# Patient Record
Sex: Male | Born: 1950
Health system: Southern US, Community
[De-identification: ages and names within clinical notes are randomized; demographics above are authoritative.]

## PROBLEM LIST (undated history)

## (undated) DIAGNOSIS — M25561 Pain in right knee: Secondary | ICD-10-CM

## (undated) DIAGNOSIS — Z7901 Long term (current) use of anticoagulants: Secondary | ICD-10-CM

## (undated) DIAGNOSIS — N200 Calculus of kidney: Secondary | ICD-10-CM

## (undated) DIAGNOSIS — L570 Actinic keratosis: Secondary | ICD-10-CM

## (undated) DIAGNOSIS — G4733 Obstructive sleep apnea (adult) (pediatric): Secondary | ICD-10-CM

## (undated) DIAGNOSIS — G4730 Sleep apnea, unspecified: Secondary | ICD-10-CM

## (undated) DIAGNOSIS — I10 Essential (primary) hypertension: Secondary | ICD-10-CM

## (undated) DIAGNOSIS — I319 Disease of pericardium, unspecified: Secondary | ICD-10-CM

## (undated) DIAGNOSIS — Z01810 Encounter for preprocedural cardiovascular examination: Secondary | ICD-10-CM

## (undated) DIAGNOSIS — I480 Paroxysmal atrial fibrillation: Secondary | ICD-10-CM

## (undated) DIAGNOSIS — I499 Cardiac arrhythmia, unspecified: Secondary | ICD-10-CM

## (undated) DIAGNOSIS — M1990 Unspecified osteoarthritis, unspecified site: Secondary | ICD-10-CM

## (undated) DIAGNOSIS — Z96651 Presence of right artificial knee joint: Secondary | ICD-10-CM

## (undated) DIAGNOSIS — I4892 Unspecified atrial flutter: Secondary | ICD-10-CM

## (undated) DIAGNOSIS — I639 Cerebral infarction, unspecified: Secondary | ICD-10-CM

## (undated) DIAGNOSIS — G8929 Other chronic pain: Secondary | ICD-10-CM

## (undated) DIAGNOSIS — Q211 Atrial septal defect: Secondary | ICD-10-CM

## (undated) DIAGNOSIS — IMO0001 Reserved for inherently not codable concepts without codable children: Secondary | ICD-10-CM

## (undated) HISTORY — DX: Encounter for preprocedural cardiovascular examination: Z01.810

## (undated) HISTORY — DX: Unspecified osteoarthritis, unspecified site: M19.90

## (undated) HISTORY — PX: CYST REMOVAL NECK: SHX6281

## (undated) HISTORY — DX: Presence of right artificial knee joint: Z96.651

## (undated) HISTORY — DX: Unspecified atrial flutter: I48.92

## (undated) HISTORY — DX: Paroxysmal atrial fibrillation: I48.0

## (undated) HISTORY — PX: INGUINAL HERNIA REPAIR: SUR1180

## (undated) HISTORY — DX: Atrial septal defect: Q21.1

## (undated) HISTORY — DX: Other chronic pain: G89.29

## (undated) HISTORY — DX: Long term (current) use of anticoagulants: Z79.01

## (undated) HISTORY — DX: Pain in right knee: M25.561

## (undated) HISTORY — DX: Obstructive sleep apnea (adult) (pediatric): G47.33

## (undated) HISTORY — DX: Calculus of kidney: N20.0

## (undated) HISTORY — DX: Actinic keratosis: L57.0

---

## 2004-11-08 ENCOUNTER — Ambulatory Visit (HOSPITAL_BASED_OUTPATIENT_CLINIC_OR_DEPARTMENT_OTHER): Admission: RE | Admit: 2004-11-08 | Discharge: 2004-11-08 | Payer: Self-pay | Admitting: Orthopedic Surgery

## 2004-11-08 ENCOUNTER — Ambulatory Visit (HOSPITAL_COMMUNITY): Admission: RE | Admit: 2004-11-08 | Discharge: 2004-11-08 | Payer: Self-pay | Admitting: Orthopedic Surgery

## 2011-04-10 HISTORY — PX: COLONOSCOPY: SHX174

## 2011-04-10 HISTORY — PX: ESOPHAGOGASTRODUODENOSCOPY: SHX1529

## 2011-10-24 ENCOUNTER — Ambulatory Visit: Payer: Self-pay

## 2014-04-28 DIAGNOSIS — I10 Essential (primary) hypertension: Secondary | ICD-10-CM | POA: Diagnosis not present

## 2014-04-28 DIAGNOSIS — E782 Mixed hyperlipidemia: Secondary | ICD-10-CM | POA: Diagnosis not present

## 2014-05-05 DIAGNOSIS — R0609 Other forms of dyspnea: Secondary | ICD-10-CM | POA: Diagnosis not present

## 2014-05-05 DIAGNOSIS — I10 Essential (primary) hypertension: Secondary | ICD-10-CM | POA: Diagnosis not present

## 2014-05-05 DIAGNOSIS — Z139 Encounter for screening, unspecified: Secondary | ICD-10-CM | POA: Diagnosis not present

## 2014-05-05 DIAGNOSIS — Z1331 Encounter for screening for depression: Secondary | ICD-10-CM | POA: Diagnosis not present

## 2014-05-05 DIAGNOSIS — R0989 Other specified symptoms and signs involving the circulatory and respiratory systems: Secondary | ICD-10-CM | POA: Diagnosis not present

## 2014-05-05 DIAGNOSIS — E782 Mixed hyperlipidemia: Secondary | ICD-10-CM | POA: Diagnosis not present

## 2014-05-12 DIAGNOSIS — R0989 Other specified symptoms and signs involving the circulatory and respiratory systems: Secondary | ICD-10-CM | POA: Diagnosis not present

## 2014-05-12 DIAGNOSIS — R0609 Other forms of dyspnea: Secondary | ICD-10-CM | POA: Diagnosis not present

## 2014-05-12 DIAGNOSIS — R0789 Other chest pain: Secondary | ICD-10-CM | POA: Diagnosis not present

## 2014-05-26 DIAGNOSIS — R0602 Shortness of breath: Secondary | ICD-10-CM | POA: Diagnosis not present

## 2014-05-26 DIAGNOSIS — Z6828 Body mass index (BMI) 28.0-28.9, adult: Secondary | ICD-10-CM | POA: Diagnosis not present

## 2014-06-08 DIAGNOSIS — R0602 Shortness of breath: Secondary | ICD-10-CM | POA: Diagnosis not present

## 2014-07-07 DIAGNOSIS — L819 Disorder of pigmentation, unspecified: Secondary | ICD-10-CM | POA: Diagnosis not present

## 2014-07-07 DIAGNOSIS — L578 Other skin changes due to chronic exposure to nonionizing radiation: Secondary | ICD-10-CM | POA: Diagnosis not present

## 2014-07-07 DIAGNOSIS — L570 Actinic keratosis: Secondary | ICD-10-CM | POA: Diagnosis not present

## 2014-07-07 DIAGNOSIS — D239 Other benign neoplasm of skin, unspecified: Secondary | ICD-10-CM | POA: Diagnosis not present

## 2014-07-07 DIAGNOSIS — L820 Inflamed seborrheic keratosis: Secondary | ICD-10-CM | POA: Diagnosis not present

## 2014-07-07 DIAGNOSIS — D1800 Hemangioma unspecified site: Secondary | ICD-10-CM | POA: Diagnosis not present

## 2014-07-07 DIAGNOSIS — L821 Other seborrheic keratosis: Secondary | ICD-10-CM | POA: Diagnosis not present

## 2014-08-31 DIAGNOSIS — I10 Essential (primary) hypertension: Secondary | ICD-10-CM | POA: Diagnosis not present

## 2014-08-31 DIAGNOSIS — E782 Mixed hyperlipidemia: Secondary | ICD-10-CM | POA: Diagnosis not present

## 2014-09-07 DIAGNOSIS — H6501 Acute serous otitis media, right ear: Secondary | ICD-10-CM | POA: Diagnosis not present

## 2014-09-07 DIAGNOSIS — Z6828 Body mass index (BMI) 28.0-28.9, adult: Secondary | ICD-10-CM | POA: Diagnosis not present

## 2014-09-07 DIAGNOSIS — I10 Essential (primary) hypertension: Secondary | ICD-10-CM | POA: Diagnosis not present

## 2014-09-07 DIAGNOSIS — E782 Mixed hyperlipidemia: Secondary | ICD-10-CM | POA: Diagnosis not present

## 2014-10-06 DIAGNOSIS — Z0000 Encounter for general adult medical examination without abnormal findings: Secondary | ICD-10-CM | POA: Diagnosis not present

## 2014-10-06 DIAGNOSIS — Z139 Encounter for screening, unspecified: Secondary | ICD-10-CM | POA: Diagnosis not present

## 2014-10-06 DIAGNOSIS — Z1389 Encounter for screening for other disorder: Secondary | ICD-10-CM | POA: Diagnosis not present

## 2014-12-07 DIAGNOSIS — M170 Bilateral primary osteoarthritis of knee: Secondary | ICD-10-CM | POA: Diagnosis not present

## 2014-12-07 DIAGNOSIS — M25462 Effusion, left knee: Secondary | ICD-10-CM | POA: Diagnosis not present

## 2014-12-07 DIAGNOSIS — G8929 Other chronic pain: Secondary | ICD-10-CM

## 2014-12-07 DIAGNOSIS — M25461 Effusion, right knee: Secondary | ICD-10-CM | POA: Diagnosis not present

## 2014-12-07 DIAGNOSIS — M25561 Pain in right knee: Secondary | ICD-10-CM | POA: Diagnosis not present

## 2014-12-07 DIAGNOSIS — M25562 Pain in left knee: Secondary | ICD-10-CM | POA: Diagnosis not present

## 2014-12-07 HISTORY — DX: Other chronic pain: G89.29

## 2014-12-14 DIAGNOSIS — L03019 Cellulitis of unspecified finger: Secondary | ICD-10-CM | POA: Diagnosis not present

## 2014-12-14 DIAGNOSIS — R81 Glycosuria: Secondary | ICD-10-CM | POA: Diagnosis not present

## 2014-12-14 DIAGNOSIS — R350 Frequency of micturition: Secondary | ICD-10-CM | POA: Diagnosis not present

## 2014-12-28 DIAGNOSIS — R7309 Other abnormal glucose: Secondary | ICD-10-CM | POA: Diagnosis not present

## 2014-12-28 DIAGNOSIS — R7301 Impaired fasting glucose: Secondary | ICD-10-CM | POA: Diagnosis not present

## 2015-01-11 DIAGNOSIS — L814 Other melanin hyperpigmentation: Secondary | ICD-10-CM | POA: Diagnosis not present

## 2015-01-11 DIAGNOSIS — D1800 Hemangioma unspecified site: Secondary | ICD-10-CM | POA: Diagnosis not present

## 2015-01-11 DIAGNOSIS — L720 Epidermal cyst: Secondary | ICD-10-CM | POA: Diagnosis not present

## 2015-01-11 DIAGNOSIS — L570 Actinic keratosis: Secondary | ICD-10-CM | POA: Diagnosis not present

## 2015-01-11 DIAGNOSIS — Z1283 Encounter for screening for malignant neoplasm of skin: Secondary | ICD-10-CM | POA: Diagnosis not present

## 2015-01-11 DIAGNOSIS — D239 Other benign neoplasm of skin, unspecified: Secondary | ICD-10-CM

## 2015-01-11 DIAGNOSIS — L821 Other seborrheic keratosis: Secondary | ICD-10-CM | POA: Diagnosis not present

## 2015-01-11 DIAGNOSIS — L578 Other skin changes due to chronic exposure to nonionizing radiation: Secondary | ICD-10-CM | POA: Diagnosis not present

## 2015-01-11 DIAGNOSIS — D225 Melanocytic nevi of trunk: Secondary | ICD-10-CM | POA: Diagnosis not present

## 2015-01-11 DIAGNOSIS — D485 Neoplasm of uncertain behavior of skin: Secondary | ICD-10-CM | POA: Diagnosis not present

## 2015-01-11 HISTORY — DX: Other benign neoplasm of skin, unspecified: D23.9

## 2015-01-18 DIAGNOSIS — H6120 Impacted cerumen, unspecified ear: Secondary | ICD-10-CM | POA: Diagnosis not present

## 2015-02-22 DIAGNOSIS — R7301 Impaired fasting glucose: Secondary | ICD-10-CM | POA: Diagnosis not present

## 2015-02-22 DIAGNOSIS — E782 Mixed hyperlipidemia: Secondary | ICD-10-CM | POA: Diagnosis not present

## 2015-02-22 DIAGNOSIS — I10 Essential (primary) hypertension: Secondary | ICD-10-CM | POA: Diagnosis not present

## 2015-03-01 DIAGNOSIS — Z6827 Body mass index (BMI) 27.0-27.9, adult: Secondary | ICD-10-CM | POA: Diagnosis not present

## 2015-03-01 DIAGNOSIS — R7301 Impaired fasting glucose: Secondary | ICD-10-CM | POA: Diagnosis not present

## 2015-04-04 DIAGNOSIS — H524 Presbyopia: Secondary | ICD-10-CM | POA: Diagnosis not present

## 2015-04-04 DIAGNOSIS — H25813 Combined forms of age-related cataract, bilateral: Secondary | ICD-10-CM | POA: Diagnosis not present

## 2015-04-04 DIAGNOSIS — H43813 Vitreous degeneration, bilateral: Secondary | ICD-10-CM | POA: Diagnosis not present

## 2015-04-04 DIAGNOSIS — H43313 Vitreous membranes and strands, bilateral: Secondary | ICD-10-CM | POA: Diagnosis not present

## 2015-04-04 DIAGNOSIS — H52223 Regular astigmatism, bilateral: Secondary | ICD-10-CM | POA: Diagnosis not present

## 2015-04-04 DIAGNOSIS — I10 Essential (primary) hypertension: Secondary | ICD-10-CM | POA: Diagnosis not present

## 2015-04-04 DIAGNOSIS — H5203 Hypermetropia, bilateral: Secondary | ICD-10-CM | POA: Diagnosis not present

## 2015-04-04 DIAGNOSIS — H35373 Puckering of macula, bilateral: Secondary | ICD-10-CM | POA: Diagnosis not present

## 2015-04-23 DIAGNOSIS — R7309 Other abnormal glucose: Secondary | ICD-10-CM | POA: Diagnosis present

## 2015-04-23 DIAGNOSIS — R29810 Facial weakness: Secondary | ICD-10-CM | POA: Diagnosis not present

## 2015-04-23 DIAGNOSIS — R410 Disorientation, unspecified: Secondary | ICD-10-CM | POA: Diagnosis not present

## 2015-04-23 DIAGNOSIS — Z789 Other specified health status: Secondary | ICD-10-CM | POA: Diagnosis not present

## 2015-04-23 DIAGNOSIS — R295 Transient paralysis: Secondary | ICD-10-CM | POA: Diagnosis not present

## 2015-04-23 DIAGNOSIS — I639 Cerebral infarction, unspecified: Secondary | ICD-10-CM | POA: Diagnosis not present

## 2015-04-23 DIAGNOSIS — I10 Essential (primary) hypertension: Secondary | ICD-10-CM | POA: Diagnosis not present

## 2015-04-23 DIAGNOSIS — I63312 Cerebral infarction due to thrombosis of left middle cerebral artery: Secondary | ICD-10-CM | POA: Diagnosis not present

## 2015-04-23 DIAGNOSIS — E785 Hyperlipidemia, unspecified: Secondary | ICD-10-CM | POA: Diagnosis present

## 2015-04-23 DIAGNOSIS — I638 Other cerebral infarction: Secondary | ICD-10-CM | POA: Diagnosis not present

## 2015-04-23 DIAGNOSIS — G8191 Hemiplegia, unspecified affecting right dominant side: Secondary | ICD-10-CM | POA: Diagnosis not present

## 2015-04-23 DIAGNOSIS — I63412 Cerebral infarction due to embolism of left middle cerebral artery: Secondary | ICD-10-CM | POA: Diagnosis not present

## 2015-04-23 DIAGNOSIS — R531 Weakness: Secondary | ICD-10-CM | POA: Diagnosis not present

## 2015-04-27 DIAGNOSIS — E785 Hyperlipidemia, unspecified: Secondary | ICD-10-CM | POA: Diagnosis not present

## 2015-04-27 DIAGNOSIS — Q211 Atrial septal defect: Secondary | ICD-10-CM | POA: Diagnosis not present

## 2015-04-27 DIAGNOSIS — Z6826 Body mass index (BMI) 26.0-26.9, adult: Secondary | ICD-10-CM | POA: Diagnosis not present

## 2015-04-27 DIAGNOSIS — I639 Cerebral infarction, unspecified: Secondary | ICD-10-CM | POA: Diagnosis not present

## 2015-05-03 DIAGNOSIS — R262 Difficulty in walking, not elsewhere classified: Secondary | ICD-10-CM | POA: Diagnosis not present

## 2015-05-03 DIAGNOSIS — M6281 Muscle weakness (generalized): Secondary | ICD-10-CM | POA: Diagnosis not present

## 2015-05-03 DIAGNOSIS — I639 Cerebral infarction, unspecified: Secondary | ICD-10-CM | POA: Diagnosis not present

## 2015-05-03 DIAGNOSIS — M25561 Pain in right knee: Secondary | ICD-10-CM | POA: Diagnosis not present

## 2015-05-07 DIAGNOSIS — M6281 Muscle weakness (generalized): Secondary | ICD-10-CM | POA: Diagnosis not present

## 2015-05-07 DIAGNOSIS — M25561 Pain in right knee: Secondary | ICD-10-CM | POA: Diagnosis not present

## 2015-05-07 DIAGNOSIS — R262 Difficulty in walking, not elsewhere classified: Secondary | ICD-10-CM | POA: Diagnosis not present

## 2015-05-07 DIAGNOSIS — I639 Cerebral infarction, unspecified: Secondary | ICD-10-CM | POA: Diagnosis not present

## 2015-05-10 DIAGNOSIS — I639 Cerebral infarction, unspecified: Secondary | ICD-10-CM | POA: Diagnosis not present

## 2015-05-10 DIAGNOSIS — R262 Difficulty in walking, not elsewhere classified: Secondary | ICD-10-CM | POA: Diagnosis not present

## 2015-05-10 DIAGNOSIS — M6281 Muscle weakness (generalized): Secondary | ICD-10-CM | POA: Diagnosis not present

## 2015-05-10 DIAGNOSIS — M25561 Pain in right knee: Secondary | ICD-10-CM | POA: Diagnosis not present

## 2015-05-14 DIAGNOSIS — R262 Difficulty in walking, not elsewhere classified: Secondary | ICD-10-CM | POA: Diagnosis not present

## 2015-05-14 DIAGNOSIS — M25561 Pain in right knee: Secondary | ICD-10-CM | POA: Diagnosis not present

## 2015-05-14 DIAGNOSIS — M6281 Muscle weakness (generalized): Secondary | ICD-10-CM | POA: Diagnosis not present

## 2015-05-14 DIAGNOSIS — I639 Cerebral infarction, unspecified: Secondary | ICD-10-CM | POA: Diagnosis not present

## 2015-05-17 DIAGNOSIS — M25561 Pain in right knee: Secondary | ICD-10-CM | POA: Diagnosis not present

## 2015-05-17 DIAGNOSIS — M6281 Muscle weakness (generalized): Secondary | ICD-10-CM | POA: Diagnosis not present

## 2015-05-17 DIAGNOSIS — R262 Difficulty in walking, not elsewhere classified: Secondary | ICD-10-CM | POA: Diagnosis not present

## 2015-05-17 DIAGNOSIS — I639 Cerebral infarction, unspecified: Secondary | ICD-10-CM | POA: Diagnosis not present

## 2015-05-22 DIAGNOSIS — M6281 Muscle weakness (generalized): Secondary | ICD-10-CM | POA: Diagnosis not present

## 2015-05-22 DIAGNOSIS — I639 Cerebral infarction, unspecified: Secondary | ICD-10-CM | POA: Diagnosis not present

## 2015-05-22 DIAGNOSIS — M25561 Pain in right knee: Secondary | ICD-10-CM | POA: Diagnosis not present

## 2015-05-22 DIAGNOSIS — R262 Difficulty in walking, not elsewhere classified: Secondary | ICD-10-CM | POA: Diagnosis not present

## 2015-05-24 DIAGNOSIS — M6281 Muscle weakness (generalized): Secondary | ICD-10-CM | POA: Diagnosis not present

## 2015-05-24 DIAGNOSIS — I639 Cerebral infarction, unspecified: Secondary | ICD-10-CM | POA: Diagnosis not present

## 2015-05-24 DIAGNOSIS — R262 Difficulty in walking, not elsewhere classified: Secondary | ICD-10-CM | POA: Diagnosis not present

## 2015-05-24 DIAGNOSIS — M25561 Pain in right knee: Secondary | ICD-10-CM | POA: Diagnosis not present

## 2015-06-04 DIAGNOSIS — M6281 Muscle weakness (generalized): Secondary | ICD-10-CM | POA: Diagnosis not present

## 2015-06-04 DIAGNOSIS — I639 Cerebral infarction, unspecified: Secondary | ICD-10-CM | POA: Diagnosis not present

## 2015-06-04 DIAGNOSIS — R262 Difficulty in walking, not elsewhere classified: Secondary | ICD-10-CM | POA: Diagnosis not present

## 2015-06-04 DIAGNOSIS — M25561 Pain in right knee: Secondary | ICD-10-CM | POA: Diagnosis not present

## 2015-06-07 DIAGNOSIS — M25561 Pain in right knee: Secondary | ICD-10-CM | POA: Diagnosis not present

## 2015-06-07 DIAGNOSIS — M6281 Muscle weakness (generalized): Secondary | ICD-10-CM | POA: Diagnosis not present

## 2015-06-07 DIAGNOSIS — R262 Difficulty in walking, not elsewhere classified: Secondary | ICD-10-CM | POA: Diagnosis not present

## 2015-06-07 DIAGNOSIS — I639 Cerebral infarction, unspecified: Secondary | ICD-10-CM | POA: Diagnosis not present

## 2015-06-10 DIAGNOSIS — Q211 Atrial septal defect, unspecified: Secondary | ICD-10-CM | POA: Insufficient documentation

## 2015-06-10 DIAGNOSIS — I639 Cerebral infarction, unspecified: Secondary | ICD-10-CM | POA: Insufficient documentation

## 2015-06-10 HISTORY — DX: Atrial septal defect: Q21.1

## 2015-06-10 HISTORY — DX: Atrial septal defect, unspecified: Q21.10

## 2015-06-12 DIAGNOSIS — M25561 Pain in right knee: Secondary | ICD-10-CM | POA: Diagnosis not present

## 2015-06-12 DIAGNOSIS — M6281 Muscle weakness (generalized): Secondary | ICD-10-CM | POA: Diagnosis not present

## 2015-06-12 DIAGNOSIS — I639 Cerebral infarction, unspecified: Secondary | ICD-10-CM | POA: Diagnosis not present

## 2015-06-12 DIAGNOSIS — R262 Difficulty in walking, not elsewhere classified: Secondary | ICD-10-CM | POA: Diagnosis not present

## 2015-06-13 DIAGNOSIS — I639 Cerebral infarction, unspecified: Secondary | ICD-10-CM | POA: Diagnosis not present

## 2015-06-13 DIAGNOSIS — Q211 Atrial septal defect: Secondary | ICD-10-CM | POA: Diagnosis not present

## 2015-06-26 ENCOUNTER — Encounter: Payer: Self-pay | Admitting: Cardiology

## 2015-06-26 DIAGNOSIS — G459 Transient cerebral ischemic attack, unspecified: Secondary | ICD-10-CM | POA: Diagnosis not present

## 2015-06-26 DIAGNOSIS — Q211 Atrial septal defect: Secondary | ICD-10-CM | POA: Diagnosis not present

## 2015-06-26 DIAGNOSIS — G4733 Obstructive sleep apnea (adult) (pediatric): Secondary | ICD-10-CM

## 2015-06-26 DIAGNOSIS — R0683 Snoring: Secondary | ICD-10-CM | POA: Diagnosis not present

## 2015-06-26 DIAGNOSIS — I639 Cerebral infarction, unspecified: Secondary | ICD-10-CM | POA: Diagnosis not present

## 2015-06-26 DIAGNOSIS — R002 Palpitations: Secondary | ICD-10-CM | POA: Diagnosis not present

## 2015-06-26 HISTORY — DX: Obstructive sleep apnea (adult) (pediatric): G47.33

## 2015-06-28 DIAGNOSIS — R7301 Impaired fasting glucose: Secondary | ICD-10-CM | POA: Diagnosis not present

## 2015-06-28 DIAGNOSIS — E782 Mixed hyperlipidemia: Secondary | ICD-10-CM | POA: Diagnosis not present

## 2015-06-28 DIAGNOSIS — I10 Essential (primary) hypertension: Secondary | ICD-10-CM | POA: Diagnosis not present

## 2015-07-05 DIAGNOSIS — R7309 Other abnormal glucose: Secondary | ICD-10-CM | POA: Diagnosis not present

## 2015-07-05 DIAGNOSIS — I639 Cerebral infarction, unspecified: Secondary | ICD-10-CM | POA: Diagnosis not present

## 2015-07-05 DIAGNOSIS — I129 Hypertensive chronic kidney disease with stage 1 through stage 4 chronic kidney disease, or unspecified chronic kidney disease: Secondary | ICD-10-CM | POA: Diagnosis not present

## 2015-07-05 DIAGNOSIS — E782 Mixed hyperlipidemia: Secondary | ICD-10-CM | POA: Diagnosis not present

## 2015-07-10 DIAGNOSIS — Q211 Atrial septal defect: Secondary | ICD-10-CM | POA: Diagnosis not present

## 2015-07-10 DIAGNOSIS — I639 Cerebral infarction, unspecified: Secondary | ICD-10-CM | POA: Diagnosis not present

## 2015-07-14 DIAGNOSIS — G4730 Sleep apnea, unspecified: Secondary | ICD-10-CM | POA: Diagnosis not present

## 2015-07-30 DIAGNOSIS — I480 Paroxysmal atrial fibrillation: Secondary | ICD-10-CM | POA: Insufficient documentation

## 2015-07-30 DIAGNOSIS — G4733 Obstructive sleep apnea (adult) (pediatric): Secondary | ICD-10-CM | POA: Diagnosis not present

## 2015-07-30 HISTORY — DX: Paroxysmal atrial fibrillation: I48.0

## 2015-08-01 DIAGNOSIS — Q211 Atrial septal defect: Secondary | ICD-10-CM | POA: Diagnosis not present

## 2015-08-01 DIAGNOSIS — I6310 Cerebral infarction due to embolism of unspecified precerebral artery: Secondary | ICD-10-CM | POA: Diagnosis not present

## 2015-08-01 DIAGNOSIS — I4892 Unspecified atrial flutter: Secondary | ICD-10-CM | POA: Insufficient documentation

## 2015-08-01 HISTORY — DX: Unspecified atrial flutter: I48.92

## 2015-08-07 DIAGNOSIS — G4733 Obstructive sleep apnea (adult) (pediatric): Secondary | ICD-10-CM | POA: Diagnosis not present

## 2015-08-07 DIAGNOSIS — Q211 Atrial septal defect: Secondary | ICD-10-CM | POA: Diagnosis not present

## 2015-08-07 DIAGNOSIS — I4892 Unspecified atrial flutter: Secondary | ICD-10-CM | POA: Diagnosis not present

## 2015-08-07 DIAGNOSIS — I639 Cerebral infarction, unspecified: Secondary | ICD-10-CM | POA: Diagnosis not present

## 2015-08-20 DIAGNOSIS — Z23 Encounter for immunization: Secondary | ICD-10-CM | POA: Diagnosis not present

## 2015-08-20 DIAGNOSIS — Z8673 Personal history of transient ischemic attack (TIA), and cerebral infarction without residual deficits: Secondary | ICD-10-CM | POA: Diagnosis not present

## 2015-08-20 DIAGNOSIS — E785 Hyperlipidemia, unspecified: Secondary | ICD-10-CM | POA: Diagnosis not present

## 2015-08-20 DIAGNOSIS — I4891 Unspecified atrial fibrillation: Secondary | ICD-10-CM | POA: Diagnosis not present

## 2015-08-29 DIAGNOSIS — I4892 Unspecified atrial flutter: Secondary | ICD-10-CM | POA: Diagnosis not present

## 2015-08-29 DIAGNOSIS — I480 Paroxysmal atrial fibrillation: Secondary | ICD-10-CM | POA: Diagnosis not present

## 2015-08-29 DIAGNOSIS — Z7901 Long term (current) use of anticoagulants: Secondary | ICD-10-CM

## 2015-08-29 HISTORY — DX: Long term (current) use of anticoagulants: Z79.01

## 2015-10-16 DIAGNOSIS — G4733 Obstructive sleep apnea (adult) (pediatric): Secondary | ICD-10-CM | POA: Diagnosis not present

## 2015-10-16 DIAGNOSIS — I639 Cerebral infarction, unspecified: Secondary | ICD-10-CM | POA: Diagnosis not present

## 2015-10-16 DIAGNOSIS — I4892 Unspecified atrial flutter: Secondary | ICD-10-CM | POA: Diagnosis not present

## 2016-01-24 DIAGNOSIS — Z1283 Encounter for screening for malignant neoplasm of skin: Secondary | ICD-10-CM | POA: Diagnosis not present

## 2016-01-24 DIAGNOSIS — L821 Other seborrheic keratosis: Secondary | ICD-10-CM | POA: Diagnosis not present

## 2016-01-24 DIAGNOSIS — D485 Neoplasm of uncertain behavior of skin: Secondary | ICD-10-CM | POA: Diagnosis not present

## 2016-01-24 DIAGNOSIS — L720 Epidermal cyst: Secondary | ICD-10-CM | POA: Diagnosis not present

## 2016-01-24 DIAGNOSIS — L570 Actinic keratosis: Secondary | ICD-10-CM | POA: Diagnosis not present

## 2016-01-24 DIAGNOSIS — L578 Other skin changes due to chronic exposure to nonionizing radiation: Secondary | ICD-10-CM | POA: Diagnosis not present

## 2016-01-24 DIAGNOSIS — D229 Melanocytic nevi, unspecified: Secondary | ICD-10-CM | POA: Diagnosis not present

## 2016-01-24 DIAGNOSIS — L814 Other melanin hyperpigmentation: Secondary | ICD-10-CM | POA: Diagnosis not present

## 2016-02-03 DIAGNOSIS — Z01810 Encounter for preprocedural cardiovascular examination: Secondary | ICD-10-CM

## 2016-02-03 HISTORY — DX: Encounter for preprocedural cardiovascular examination: Z01.810

## 2016-02-04 ENCOUNTER — Encounter: Payer: Self-pay | Admitting: Cardiology

## 2016-02-04 DIAGNOSIS — Z7901 Long term (current) use of anticoagulants: Secondary | ICD-10-CM | POA: Diagnosis not present

## 2016-02-04 DIAGNOSIS — Z01810 Encounter for preprocedural cardiovascular examination: Secondary | ICD-10-CM | POA: Diagnosis not present

## 2016-02-04 DIAGNOSIS — I639 Cerebral infarction, unspecified: Secondary | ICD-10-CM | POA: Diagnosis not present

## 2016-02-04 DIAGNOSIS — I4892 Unspecified atrial flutter: Secondary | ICD-10-CM | POA: Diagnosis not present

## 2016-02-06 DIAGNOSIS — I4891 Unspecified atrial fibrillation: Secondary | ICD-10-CM | POA: Diagnosis not present

## 2016-02-06 DIAGNOSIS — Z8673 Personal history of transient ischemic attack (TIA), and cerebral infarction without residual deficits: Secondary | ICD-10-CM | POA: Diagnosis not present

## 2016-02-06 DIAGNOSIS — I4892 Unspecified atrial flutter: Secondary | ICD-10-CM | POA: Diagnosis not present

## 2016-02-06 DIAGNOSIS — G4733 Obstructive sleep apnea (adult) (pediatric): Secondary | ICD-10-CM | POA: Diagnosis not present

## 2016-02-06 DIAGNOSIS — R7303 Prediabetes: Secondary | ICD-10-CM | POA: Diagnosis not present

## 2016-02-06 DIAGNOSIS — I639 Cerebral infarction, unspecified: Secondary | ICD-10-CM | POA: Diagnosis not present

## 2016-02-14 DIAGNOSIS — M1712 Unilateral primary osteoarthritis, left knee: Secondary | ICD-10-CM | POA: Diagnosis not present

## 2016-02-22 ENCOUNTER — Other Ambulatory Visit: Payer: Self-pay | Admitting: Orthopedic Surgery

## 2016-03-06 ENCOUNTER — Ambulatory Visit (HOSPITAL_COMMUNITY)
Admission: RE | Admit: 2016-03-06 | Discharge: 2016-03-06 | Disposition: A | Discharge status: Home / Self Care | Payer: Medicare Other | Source: Ambulatory Visit | Attending: Orthopedic Surgery | Admitting: Orthopedic Surgery

## 2016-03-06 ENCOUNTER — Encounter (HOSPITAL_COMMUNITY): Payer: Self-pay

## 2016-03-06 ENCOUNTER — Encounter (HOSPITAL_COMMUNITY)
Admission: RE | Admit: 2016-03-06 | Discharge: 2016-03-06 | Disposition: A | Discharge status: Home / Self Care | Payer: Medicare Other | Source: Ambulatory Visit | Attending: Orthopedic Surgery | Admitting: Orthopedic Surgery

## 2016-03-06 DIAGNOSIS — Z79899 Other long term (current) drug therapy: Secondary | ICD-10-CM | POA: Insufficient documentation

## 2016-03-06 DIAGNOSIS — Z01812 Encounter for preprocedural laboratory examination: Secondary | ICD-10-CM | POA: Insufficient documentation

## 2016-03-06 DIAGNOSIS — Z7901 Long term (current) use of anticoagulants: Secondary | ICD-10-CM | POA: Insufficient documentation

## 2016-03-06 DIAGNOSIS — Z8673 Personal history of transient ischemic attack (TIA), and cerebral infarction without residual deficits: Secondary | ICD-10-CM | POA: Diagnosis not present

## 2016-03-06 DIAGNOSIS — M1711 Unilateral primary osteoarthritis, right knee: Secondary | ICD-10-CM | POA: Insufficient documentation

## 2016-03-06 DIAGNOSIS — R9431 Abnormal electrocardiogram [ECG] [EKG]: Secondary | ICD-10-CM | POA: Diagnosis not present

## 2016-03-06 DIAGNOSIS — Z01818 Encounter for other preprocedural examination: Secondary | ICD-10-CM

## 2016-03-06 DIAGNOSIS — I10 Essential (primary) hypertension: Secondary | ICD-10-CM | POA: Insufficient documentation

## 2016-03-06 DIAGNOSIS — Z0183 Encounter for blood typing: Secondary | ICD-10-CM | POA: Diagnosis not present

## 2016-03-06 DIAGNOSIS — G4733 Obstructive sleep apnea (adult) (pediatric): Secondary | ICD-10-CM | POA: Diagnosis not present

## 2016-03-06 DIAGNOSIS — I4891 Unspecified atrial fibrillation: Secondary | ICD-10-CM | POA: Diagnosis not present

## 2016-03-06 HISTORY — DX: Cardiac arrhythmia, unspecified: I49.9

## 2016-03-06 HISTORY — DX: Essential (primary) hypertension: I10

## 2016-03-06 HISTORY — DX: Cerebral infarction, unspecified: I63.9

## 2016-03-06 HISTORY — DX: Sleep apnea, unspecified: G47.30

## 2016-03-06 HISTORY — DX: Reserved for inherently not codable concepts without codable children: IMO0001

## 2016-03-06 HISTORY — DX: Disease of pericardium, unspecified: I31.9

## 2016-03-06 LAB — CBC WITH DIFFERENTIAL/PLATELET
Basophils Absolute: 0.1 10*3/uL (ref 0.0–0.1)
Basophils Relative: 1 %
Eosinophils Absolute: 0.2 10*3/uL (ref 0.0–0.7)
Eosinophils Relative: 3 %
HCT: 46.1 % (ref 39.0–52.0)
Hemoglobin: 15.6 g/dL (ref 13.0–17.0)
Lymphocytes Relative: 32 %
Lymphs Abs: 1.8 10*3/uL (ref 0.7–4.0)
MCH: 29.5 pg (ref 26.0–34.0)
MCHC: 33.8 g/dL (ref 30.0–36.0)
MCV: 87.3 fL (ref 78.0–100.0)
Monocytes Absolute: 0.6 10*3/uL (ref 0.1–1.0)
Monocytes Relative: 11 %
Neutro Abs: 3 10*3/uL (ref 1.7–7.7)
Neutrophils Relative %: 53 %
Platelets: 293 10*3/uL (ref 150–400)
RBC: 5.28 MIL/uL (ref 4.22–5.81)
RDW: 13 % (ref 11.5–15.5)
WBC: 5.6 10*3/uL (ref 4.0–10.5)

## 2016-03-06 LAB — COMPREHENSIVE METABOLIC PANEL
ALT: 21 U/L (ref 17–63)
AST: 27 U/L (ref 15–41)
Albumin: 4.1 g/dL (ref 3.5–5.0)
Alkaline Phosphatase: 80 U/L (ref 38–126)
Anion gap: 12 (ref 5–15)
BUN: 14 mg/dL (ref 6–20)
CO2: 22 mmol/L (ref 22–32)
Calcium: 9.3 mg/dL (ref 8.9–10.3)
Chloride: 108 mmol/L (ref 101–111)
Creatinine, Ser: 1.27 mg/dL — ABNORMAL HIGH (ref 0.61–1.24)
GFR calc Af Amer: >60 mL/min (ref >60)
GFR calc non Af Amer: 57 mL/min — ABNORMAL LOW (ref >60)
Glucose, Bld: 108 mg/dL — ABNORMAL HIGH (ref 65–99)
Potassium: 4 mmol/L (ref 3.5–5.1)
Sodium: 142 mmol/L (ref 135–145)
Total Bilirubin: 0.9 mg/dL (ref 0.3–1.2)
Total Protein: 7.3 g/dL (ref 6.5–8.1)

## 2016-03-06 LAB — SURGICAL PCR SCREEN
MRSA, PCR: POSITIVE — AB
Staphylococcus aureus: POSITIVE — AB

## 2016-03-06 LAB — URINALYSIS, ROUTINE W REFLEX MICROSCOPIC
Bilirubin Urine: NEGATIVE
Glucose, UA: 100 mg/dL — AB
Hgb urine dipstick: NEGATIVE
Ketones, ur: NEGATIVE mg/dL
Leukocytes, UA: NEGATIVE
Nitrite: NEGATIVE
Protein, ur: NEGATIVE mg/dL
Specific Gravity, Urine: 1.025 (ref 1.005–1.030)
pH: 5.5 (ref 5.0–8.0)

## 2016-03-06 LAB — APTT: aPTT: 34 seconds (ref 24–37)

## 2016-03-06 LAB — TYPE AND SCREEN
ABO/RH(D): O POS
Antibody Screen: NEGATIVE

## 2016-03-06 LAB — PROTIME-INR
INR: 1.19 (ref 0.00–1.49)
Prothrombin Time: 15.2 seconds (ref 11.6–15.2)

## 2016-03-06 LAB — ABO/RH: ABO/RH(D): O POS

## 2016-03-06 NOTE — Progress Notes (Signed)
PATIENT STATED HIS LAST DOSE OF ELIQUIS WILL BE 03/13/16.

## 2016-03-06 NOTE — Pre-Procedure Instructions (Signed)
Mathew Leach  03/06/2016      [facility] - [place], [place] [address] Phone: [phone] Fax: [phone]  [id number] - [place], [place] - [address] [place] [address] Phone: [phone] Fax: [phone]    Your procedure is scheduled on  Monday  03/17/16  Report to [facility] Admitting at 620 A.M.  Call this number if you have problems the morning of surgery:  [phone]   Remember:  Do not eat food or drink liquids after midnight.  Take these medicines the morning of surgery with A SIP OF WATER   AMLODIPINE (NORVASC)          (STOP FISH OIL,  ELIQUIS  AS DIRECTED , NO ASPIRIN, IBUPROFEN/ ADVIL/ MOTRIN, GOODY POWDERS/ BCS, HERBAL MEDICINES)   Do not wear jewelry, make-up or nail polish.  Do not wear lotions, powders, or perfumes.  You may wear deodorant.  Do not shave 48 hours prior to surgery.  Men may shave face and neck.  Do not bring valuables to the hospital.  [facility] is not responsible for any belongings or valuables.  Contacts, dentures or bridgework may not be worn into surgery.  Leave your suitcase in the car.  After surgery it may be brought to your room.  For patients admitted to the hospital, discharge time will be determined by your treatment team.  Patients discharged the day of surgery will not be allowed to drive home.   Name and phone number of your driver:  Special instructions:  [place] - Preparing for Surgery  Before surgery, you can play an important role.  Because skin is not sterile, your skin needs to be as free of germs as possible.  You can reduce the number of germs on you skin by washing with CHG (chlorahexidine gluconate) soap before surgery.  CHG is an antiseptic cleaner which kills germs and bonds with the skin to continue killing germs even after washing.  Please DO NOT use if you  have an allergy to CHG or antibacterial soaps.  If your skin becomes reddened/irritated stop using the CHG and inform your nurse when you arrive at Short Stay.  Do not shave (including legs and underarms) for at least 48 hours prior to the first CHG shower.  You may shave your face.  Please follow these instructions carefully:   1.  Shower with CHG Soap the night before surgery and the                                morning of Surgery.  2.  If you choose to wash your hair, wash your hair first as usual with your       normal shampoo.  3.  After you shampoo, rinse your hair and body thoroughly to remove the                      Shampoo.  4.  Use CHG as you would any other liquid soap.  You can apply chg directly       to the skin and wash gently with scrungie or a clean washcloth.  5.  Apply the CHG Soap to your body ONLY FROM THE NECK DOWN.        Do not  use on open wounds or open sores.  Avoid contact with your eyes,       ears, mouth and genitals (private parts).  Wash genitals (private parts)       with your normal soap.  6.  Wash thoroughly, paying special attention to the area where your surgery        will be performed.  7.  Thoroughly rinse your body with warm water from the neck down.  8.  DO NOT shower/wash with your normal soap after using and rinsing off       the CHG Soap.  9.  Pat yourself dry with a clean towel.            10.  Wear clean pajamas.            11.  Place clean sheets on your bed the night of your first shower and do not        sleep with pets.  Day of Surgery  Do not apply any lotions/deoderants the morning of surgery.  Please wear clean clothes to the hospital/surgery center.    Please read over the following fact sheets that you were given. Pain Booklet, Coughing and Deep Breathing, Blood Transfusion Information, MRSA Information and Surgical Site Infection Prevention

## 2016-03-06 NOTE — Progress Notes (Signed)
Prescription called in for mupirocin to [facility] in [place] [phone].

## 2016-03-07 LAB — URINE CULTURE: Culture: NO GROWTH

## 2016-03-07 NOTE — Progress Notes (Addendum)
Anesthesia Chart Review:  Pt is a 65 year old male scheduled for R total knee arthroplasty on 03/17/2016 with Dr. Ronnie Derby.   Cardiologist is Dr. Shirlee More (care everywhere), who has cleared pt for surgery.   PMH includes:  HTN, stroke (04/2015), OSA, atrial fibrillation, pericarditis (1999), SOB with exertion. Never smoker. BMI 27.5  Medications include: amlodipin-benazepril, eliquis, simvastatin. Last dose eliquis will be 03/13/16.   Preoperative labs reviewed.    Chest x-ray 03/06/16 reviewed. No edema or consolidation  EKG 03/06/16: NSR. Nonspecific T wave abnormality  Echo 06/26/15:  - RV normal in size and function - LA mildly dilated - RA size is normal - PFO present with brisk R to L shunt - Interatrial septal aneurysm  Nuclear stress test 05/12/14:  - No evidence ischemia or significant scar. Normal wall motion and contractility. LV EF 66%  Willeen Cass, FNP-BC [facility]/Anesthesiology Phone: [phone] 03/07/2016 12:38 PM   Spoke with Dr. Bettina Gavia. There are no plans to intervene on pt's PFO.   If no changes, I anticipate pt can proceed with surgery as scheduled.   Willeen Cass, FNP-BC [facility]/Anesthesiology Phone: [phone] 03/10/2016 4:05 PM

## 2016-03-16 MED ORDER — SODIUM CHLORIDE 0.9 % IV SOLN: INTRAVENOUS | Status: DC

## 2016-03-16 MED ORDER — CEFAZOLIN SODIUM-DEXTROSE 2-4 GM/100ML-% IV SOLN
2.0000 g | INTRAVENOUS | Status: AC
  Administered 2016-03-17: 2 g via INTRAVENOUS
  Filled 2016-03-16: qty 100

## 2016-03-16 MED ORDER — ACETAMINOPHEN 500 MG PO TABS
1000.0000 mg | ORAL_TABLET | ORAL | Status: AC
  Administered 2016-03-17: 1000 mg via ORAL
  Filled 2016-03-16: qty 2

## 2016-03-16 MED ORDER — TRANEXAMIC ACID 1000 MG/10ML IV SOLN
1000.0000 mg | INTRAVENOUS | Status: AC
  Administered 2016-03-17: 1000 mg via INTRAVENOUS
  Filled 2016-03-16: qty 10

## 2016-03-16 NOTE — Anesthesia Preprocedure Evaluation (Addendum)
Anesthesia Evaluation  Patient identified by MRN, date of birth, ID band Patient awake    Reviewed: Allergy & Precautions, NPO status , Patient's Chart, lab work & pertinent test results  Airway Mallampati: I  TM Distance: >3 FB Neck ROM: Full    Dental  (+) Teeth Intact   Pulmonary sleep apnea and Continuous Positive Airway Pressure Ventilation ,    breath sounds clear to auscultation       Cardiovascular hypertension, + dysrhythmias Atrial Fibrillation  Rhythm:Regular Rate:Normal     Neuro/Psych CVA, No Residual Symptoms negative psych ROS   GI/Hepatic negative GI ROS, Neg liver ROS,   Endo/Other  negative endocrine ROS  Renal/GU negative Renal ROS  negative genitourinary   Musculoskeletal  (+) Arthritis , Osteoarthritis,    Abdominal   Peds negative pediatric ROS (+)  Hematology negative hematology ROS (+)   Anesthesia Other Findings -   Reproductive/Obstetrics negative OB ROS                            Lab Results  Component Value Date   WBC 5.6 03/06/2016   HGB 15.6 03/06/2016   HCT 46.1 03/06/2016   MCV 87.3 03/06/2016   PLT 293 03/06/2016   Lab Results  Component Value Date   INR 1.19 03/06/2016     Anesthesia Physical Anesthesia Plan  ASA: III  Anesthesia Plan: Spinal   Post-op Pain Management:  Regional for Post-op pain   Induction: Intravenous  Airway Management Planned: Simple Face Mask  Additional Equipment:   Intra-op Plan:   Post-operative Plan:   Informed Consent: I have reviewed the patients History and Physical, chart, labs and discussed the procedure including the risks, benefits and alternatives for the proposed anesthesia with the patient or authorized representative who has indicated his/her understanding and acceptance.   Dental advisory given  Plan Discussed with: CRNA  Anesthesia Plan Comments:        Anesthesia Quick Evaluation

## 2016-03-17 ENCOUNTER — Encounter (HOSPITAL_COMMUNITY)
Admission: RE | Disposition: A | Discharge status: Home Health | Payer: Self-pay | Source: Ambulatory Visit | Attending: Orthopedic Surgery

## 2016-03-17 ENCOUNTER — Inpatient Hospital Stay (HOSPITAL_COMMUNITY)
Admission: RE | Admit: 2016-03-17 | Discharge: 2016-03-18 | DRG: 470 | Disposition: A | Discharge status: Home Health | Payer: Medicare Other | Source: Ambulatory Visit | Attending: Orthopedic Surgery | Admitting: Orthopedic Surgery

## 2016-03-17 ENCOUNTER — Inpatient Hospital Stay (HOSPITAL_COMMUNITY): Payer: Medicare Other | Admitting: Certified Registered Nurse Anesthetist

## 2016-03-17 ENCOUNTER — Encounter (HOSPITAL_COMMUNITY): Payer: Self-pay | Admitting: *Deleted

## 2016-03-17 ENCOUNTER — Inpatient Hospital Stay (HOSPITAL_COMMUNITY): Payer: Medicare Other | Admitting: Emergency Medicine

## 2016-03-17 DIAGNOSIS — M1711 Unilateral primary osteoarthritis, right knee: Principal | ICD-10-CM | POA: Diagnosis present

## 2016-03-17 DIAGNOSIS — I4891 Unspecified atrial fibrillation: Secondary | ICD-10-CM | POA: Diagnosis not present

## 2016-03-17 DIAGNOSIS — Z888 Allergy status to other drugs, medicaments and biological substances status: Secondary | ICD-10-CM

## 2016-03-17 DIAGNOSIS — G8918 Other acute postprocedural pain: Secondary | ICD-10-CM | POA: Diagnosis not present

## 2016-03-17 DIAGNOSIS — Z8673 Personal history of transient ischemic attack (TIA), and cerebral infarction without residual deficits: Secondary | ICD-10-CM | POA: Diagnosis not present

## 2016-03-17 DIAGNOSIS — Z96651 Presence of right artificial knee joint: Secondary | ICD-10-CM

## 2016-03-17 DIAGNOSIS — G4730 Sleep apnea, unspecified: Secondary | ICD-10-CM | POA: Diagnosis present

## 2016-03-17 DIAGNOSIS — D62 Acute posthemorrhagic anemia: Secondary | ICD-10-CM | POA: Diagnosis not present

## 2016-03-17 DIAGNOSIS — I10 Essential (primary) hypertension: Secondary | ICD-10-CM | POA: Diagnosis present

## 2016-03-17 DIAGNOSIS — M179 Osteoarthritis of knee, unspecified: Secondary | ICD-10-CM | POA: Diagnosis not present

## 2016-03-17 DIAGNOSIS — Z7901 Long term (current) use of anticoagulants: Secondary | ICD-10-CM | POA: Diagnosis not present

## 2016-03-17 DIAGNOSIS — Z79899 Other long term (current) drug therapy: Secondary | ICD-10-CM

## 2016-03-17 HISTORY — DX: Presence of right artificial knee joint: Z96.651

## 2016-03-17 HISTORY — PX: TOTAL KNEE ARTHROPLASTY: SHX125

## 2016-03-17 SURGERY — ARTHROPLASTY, KNEE, TOTAL
Anesthesia: Monitor Anesthesia Care | Laterality: Right

## 2016-03-17 MED ORDER — ONDANSETRON HCL 4 MG PO TABS: 4.0000 mg | ORAL_TABLET | Freq: Four times a day (QID) | ORAL | Status: DC | PRN

## 2016-03-17 MED ORDER — METHOCARBAMOL 1000 MG/10ML IJ SOLN: 500.0000 mg | Freq: Four times a day (QID) | INTRAVENOUS | Status: DC | PRN

## 2016-03-17 MED ORDER — METHOCARBAMOL 500 MG PO TABS
500.0000 mg | ORAL_TABLET | Freq: Four times a day (QID) | ORAL | Status: DC | PRN
  Administered 2016-03-17 – 2016-03-18 (×3): 500 mg via ORAL
  Filled 2016-03-17 (×3): qty 1

## 2016-03-17 MED ORDER — PHENYLEPHRINE HCL 10 MG/ML IJ SOLN
INTRAMUSCULAR | Status: DC | PRN
  Administered 2016-03-17: 80 ug via INTRAVENOUS

## 2016-03-17 MED ORDER — DIPHENHYDRAMINE HCL 12.5 MG/5ML PO ELIX: 12.5000 mg | ORAL_SOLUTION | ORAL | Status: DC | PRN

## 2016-03-17 MED ORDER — BISACODYL 5 MG PO TBEC: 5.0000 mg | DELAYED_RELEASE_TABLET | Freq: Every day | ORAL | Status: DC | PRN

## 2016-03-17 MED ORDER — LACTATED RINGERS IV SOLN: INTRAVENOUS | Status: DC

## 2016-03-17 MED ORDER — SIMVASTATIN 20 MG PO TABS
20.0000 mg | ORAL_TABLET | Freq: Every day | ORAL | Status: DC
  Administered 2016-03-17: 20 mg via ORAL
  Filled 2016-03-17: qty 1

## 2016-03-17 MED ORDER — SODIUM CHLORIDE 0.9 % IJ SOLN
INTRAMUSCULAR | Status: DC | PRN
  Administered 2016-03-17: 20 mL

## 2016-03-17 MED ORDER — BUPIVACAINE-EPINEPHRINE (PF) 0.5% -1:200000 IJ SOLN
INTRAMUSCULAR | Status: DC | PRN
  Administered 2016-03-17: 30 mL via PERINEURAL

## 2016-03-17 MED ORDER — BUPIVACAINE-EPINEPHRINE (PF) 0.5% -1:200000 IJ SOLN
INTRAMUSCULAR | Status: AC
  Filled 2016-03-17: qty 30

## 2016-03-17 MED ORDER — CHLORHEXIDINE GLUCONATE 4 % EX LIQD: 60.0000 mL | Freq: Once | CUTANEOUS | Status: DC

## 2016-03-17 MED ORDER — ALUM & MAG HYDROXIDE-SIMETH 200-200-20 MG/5ML PO SUSP: 30.0000 mL | ORAL | Status: DC | PRN

## 2016-03-17 MED ORDER — MEPERIDINE HCL 25 MG/ML IJ SOLN: 6.2500 mg | INTRAMUSCULAR | Status: DC | PRN

## 2016-03-17 MED ORDER — ZOLPIDEM TARTRATE 5 MG PO TABS: 5.0000 mg | ORAL_TABLET | Freq: Every evening | ORAL | Status: DC | PRN

## 2016-03-17 MED ORDER — SODIUM CHLORIDE 0.9 % IV SOLN
INTRAVENOUS | Status: DC
  Administered 2016-03-17 (×2): via INTRAVENOUS

## 2016-03-17 MED ORDER — FENTANYL CITRATE (PF) 100 MCG/2ML IJ SOLN
INTRAMUSCULAR | Status: AC
  Filled 2016-03-17: qty 2

## 2016-03-17 MED ORDER — ACETAMINOPHEN 650 MG RE SUPP: 650.0000 mg | Freq: Four times a day (QID) | RECTAL | Status: DC | PRN

## 2016-03-17 MED ORDER — METOCLOPRAMIDE HCL 5 MG/ML IJ SOLN: 5.0000 mg | Freq: Three times a day (TID) | INTRAMUSCULAR | Status: DC | PRN

## 2016-03-17 MED ORDER — LACTATED RINGERS IV SOLN
INTRAVENOUS | Status: DC
  Administered 2016-03-17 (×2): via INTRAVENOUS

## 2016-03-17 MED ORDER — FENTANYL CITRATE (PF) 250 MCG/5ML IJ SOLN
INTRAMUSCULAR | Status: AC
  Filled 2016-03-17: qty 5

## 2016-03-17 MED ORDER — FENTANYL CITRATE (PF) 100 MCG/2ML IJ SOLN
INTRAMUSCULAR | Status: DC | PRN
  Administered 2016-03-17: 50 ug via INTRAVENOUS

## 2016-03-17 MED ORDER — APIXABAN 2.5 MG PO TABS
2.5000 mg | ORAL_TABLET | Freq: Two times a day (BID) | ORAL | Status: DC
  Administered 2016-03-18: 2.5 mg via ORAL
  Filled 2016-03-17: qty 1

## 2016-03-17 MED ORDER — SENNOSIDES-DOCUSATE SODIUM 8.6-50 MG PO TABS: 1.0000 | ORAL_TABLET | Freq: Every evening | ORAL | Status: DC | PRN

## 2016-03-17 MED ORDER — PHENOL 1.4 % MT LIQD
1.0000 | OROMUCOSAL | Status: DC | PRN
Start: 2016-03-17 — End: 2016-03-18

## 2016-03-17 MED ORDER — MIDAZOLAM HCL 2 MG/2ML IJ SOLN
1.0000 mg | INTRAMUSCULAR | Status: DC | PRN
  Administered 2016-03-17: 2 mg via INTRAVENOUS

## 2016-03-17 MED ORDER — ONDANSETRON HCL 4 MG/2ML IJ SOLN
INTRAMUSCULAR | Status: DC | PRN
  Administered 2016-03-17: 4 mg via INTRAVENOUS

## 2016-03-17 MED ORDER — MIDAZOLAM HCL 2 MG/2ML IJ SOLN
INTRAMUSCULAR | Status: AC
  Filled 2016-03-17: qty 2

## 2016-03-17 MED ORDER — CELECOXIB 200 MG PO CAPS
200.0000 mg | ORAL_CAPSULE | Freq: Two times a day (BID) | ORAL | Status: DC
  Administered 2016-03-17 – 2016-03-18 (×3): 200 mg via ORAL
  Filled 2016-03-17 (×3): qty 1

## 2016-03-17 MED ORDER — HYDROMORPHONE HCL 1 MG/ML IJ SOLN
1.0000 mg | INTRAMUSCULAR | Status: DC | PRN
  Administered 2016-03-18: 1 mg via INTRAVENOUS
  Filled 2016-03-17: qty 1

## 2016-03-17 MED ORDER — BUPIVACAINE-EPINEPHRINE 0.5% -1:200000 IJ SOLN
INTRAMUSCULAR | Status: DC | PRN
Start: 2016-03-17 — End: 2016-03-17
  Administered 2016-03-17: 30 mL

## 2016-03-17 MED ORDER — BUPIVACAINE LIPOSOME 1.3 % IJ SUSP
INTRAMUSCULAR | Status: DC | PRN
  Administered 2016-03-17: 20 mL

## 2016-03-17 MED ORDER — TRANEXAMIC ACID 1000 MG/10ML IV SOLN
1000.0000 mg | Freq: Once | INTRAVENOUS | Status: AC
  Administered 2016-03-17: 1000 mg via INTRAVENOUS
  Filled 2016-03-17 (×2): qty 10

## 2016-03-17 MED ORDER — FENTANYL CITRATE (PF) 100 MCG/2ML IJ SOLN
50.0000 ug | INTRAMUSCULAR | Status: DC | PRN
  Administered 2016-03-17: 50 ug via INTRAVENOUS

## 2016-03-17 MED ORDER — FLEET ENEMA 7-19 GM/118ML RE ENEM: 1.0000 | ENEMA | Freq: Once | RECTAL | Status: DC | PRN

## 2016-03-17 MED ORDER — OXYCODONE HCL ER 10 MG PO T12A
10.0000 mg | EXTENDED_RELEASE_TABLET | Freq: Two times a day (BID) | ORAL | Status: DC
  Administered 2016-03-17 – 2016-03-18 (×3): 10 mg via ORAL
  Filled 2016-03-17 (×3): qty 1

## 2016-03-17 MED ORDER — BUPIVACAINE LIPOSOME 1.3 % IJ SUSP
20.0000 mL | INTRAMUSCULAR | Status: DC
  Filled 2016-03-17: qty 20

## 2016-03-17 MED ORDER — AMLODIPINE BESYLATE 10 MG PO TABS
10.0000 mg | ORAL_TABLET | Freq: Every day | ORAL | Status: DC
  Administered 2016-03-18: 10 mg via ORAL
  Filled 2016-03-17 (×2): qty 1

## 2016-03-17 MED ORDER — DOCUSATE SODIUM 100 MG PO CAPS
100.0000 mg | ORAL_CAPSULE | Freq: Two times a day (BID) | ORAL | Status: DC
  Administered 2016-03-17 – 2016-03-18 (×3): 100 mg via ORAL
  Filled 2016-03-17 (×3): qty 1

## 2016-03-17 MED ORDER — ACETAMINOPHEN 325 MG PO TABS: 650.0000 mg | ORAL_TABLET | Freq: Four times a day (QID) | ORAL | Status: DC | PRN

## 2016-03-17 MED ORDER — BENAZEPRIL HCL 20 MG PO TABS
20.0000 mg | ORAL_TABLET | Freq: Every day | ORAL | Status: DC
  Administered 2016-03-18: 20 mg via ORAL
  Filled 2016-03-17 (×2): qty 1

## 2016-03-17 MED ORDER — CEFAZOLIN SODIUM-DEXTROSE 2-4 GM/100ML-% IV SOLN
2.0000 g | Freq: Four times a day (QID) | INTRAVENOUS | Status: AC
  Administered 2016-03-17 (×2): 2 g via INTRAVENOUS
  Filled 2016-03-17 (×2): qty 100

## 2016-03-17 MED ORDER — SODIUM CHLORIDE 0.9 % IR SOLN
Status: DC | PRN
  Administered 2016-03-17: 1000 mL

## 2016-03-17 MED ORDER — MIDAZOLAM HCL 5 MG/5ML IJ SOLN
INTRAMUSCULAR | Status: DC | PRN
  Administered 2016-03-17: 2 mg via INTRAVENOUS

## 2016-03-17 MED ORDER — HYDROMORPHONE HCL 1 MG/ML IJ SOLN: 0.2500 mg | INTRAMUSCULAR | Status: DC | PRN

## 2016-03-17 MED ORDER — MENTHOL 3 MG MT LOZG: 1.0000 | LOZENGE | OROMUCOSAL | Status: DC | PRN

## 2016-03-17 MED ORDER — AMLODIPINE BESY-BENAZEPRIL HCL 10-20 MG PO CAPS: 1.0000 | ORAL_CAPSULE | Freq: Every day | ORAL | Status: DC

## 2016-03-17 MED ORDER — ONDANSETRON HCL 4 MG/2ML IJ SOLN: 4.0000 mg | Freq: Four times a day (QID) | INTRAMUSCULAR | Status: DC | PRN

## 2016-03-17 MED ORDER — METOCLOPRAMIDE HCL 5 MG PO TABS: 5.0000 mg | ORAL_TABLET | Freq: Three times a day (TID) | ORAL | Status: DC | PRN

## 2016-03-17 MED ORDER — PROPOFOL 500 MG/50ML IV EMUL
INTRAVENOUS | Status: DC | PRN
  Administered 2016-03-17: 30 ug/kg/min via INTRAVENOUS

## 2016-03-17 MED ORDER — BUPIVACAINE IN DEXTROSE 0.75-8.25 % IT SOLN
INTRATHECAL | Status: DC | PRN
  Administered 2016-03-17: 2 mL via INTRATHECAL

## 2016-03-17 MED ORDER — PROPOFOL 10 MG/ML IV BOLUS
INTRAVENOUS | Status: AC
  Filled 2016-03-17: qty 20

## 2016-03-17 MED ORDER — EPHEDRINE SULFATE 50 MG/ML IJ SOLN
INTRAMUSCULAR | Status: DC | PRN
  Administered 2016-03-17: 10 mg via INTRAVENOUS

## 2016-03-17 MED ORDER — HYDROMORPHONE HCL 1 MG/ML IJ SOLN
INTRAMUSCULAR | Status: AC
  Administered 2016-03-17: 1 mg
  Filled 2016-03-17: qty 1

## 2016-03-17 MED ORDER — OXYCODONE HCL 5 MG PO TABS
5.0000 mg | ORAL_TABLET | ORAL | Status: DC | PRN
  Administered 2016-03-17 – 2016-03-18 (×3): 10 mg via ORAL
  Filled 2016-03-17 (×3): qty 2

## 2016-03-17 SURGICAL SUPPLY — 65 items
BANDAGE ESMARK 6X9 LF (GAUZE/BANDAGES/DRESSINGS) ×1 IMPLANT
BLADE SAGITTAL 13X1.27X60 (BLADE) ×2 IMPLANT
BLADE SAGITTAL 13X1.27X60MM (BLADE) ×1
BLADE SAW SGTL 83.5X18.5 (BLADE) ×3 IMPLANT
BLADE SURG 10 STRL SS (BLADE) ×3 IMPLANT
BNDG CMPR 9X6 STRL LF SNTH (GAUZE/BANDAGES/DRESSINGS) ×1
BNDG ESMARK 6X9 LF (GAUZE/BANDAGES/DRESSINGS) ×3
BOWL SMART MIX CTS (DISPOSABLE) ×3 IMPLANT
CAPT KNEE TOTAL 3 ×3 IMPLANT
CEMENT BONE SIMPLEX SPEEDSET (Cement) ×6 IMPLANT
COVER SURGICAL LIGHT HANDLE (MISCELLANEOUS) ×3 IMPLANT
CUFF TOURNIQUET SINGLE 34IN LL (TOURNIQUET CUFF) ×3 IMPLANT
DRAPE EXTREMITY T 121X128X90 (DRAPE) ×3 IMPLANT
DRAPE INCISE IOBAN 66X45 STRL (DRAPES) ×6 IMPLANT
DRAPE PROXIMA HALF (DRAPES) IMPLANT
DRAPE U-SHAPE 47X51 STRL (DRAPES) ×3 IMPLANT
DRSG ADAPTIC 3X8 NADH LF (GAUZE/BANDAGES/DRESSINGS) ×3 IMPLANT
DRSG PAD ABDOMINAL 8X10 ST (GAUZE/BANDAGES/DRESSINGS) ×3 IMPLANT
DURAPREP 26ML APPLICATOR (WOUND CARE) ×6 IMPLANT
ELECT REM PT RETURN 9FT ADLT (ELECTROSURGICAL) ×3
ELECTRODE REM PT RTRN 9FT ADLT (ELECTROSURGICAL) ×1 IMPLANT
GAUZE SPONGE 4X4 12PLY STRL (GAUZE/BANDAGES/DRESSINGS) ×3 IMPLANT
GAUZE XEROFORM 5X9 LF (GAUZE/BANDAGES/DRESSINGS) ×2 IMPLANT
GLOVE BIOGEL M 7.0 STRL (GLOVE) IMPLANT
GLOVE BIOGEL PI IND STRL 7.5 (GLOVE) IMPLANT
GLOVE BIOGEL PI IND STRL 8.5 (GLOVE) ×5 IMPLANT
GLOVE BIOGEL PI INDICATOR 7.5 (GLOVE)
GLOVE BIOGEL PI INDICATOR 8.5 (GLOVE) ×10
GLOVE SURG ORTHO 8.0 STRL STRW (GLOVE) ×18 IMPLANT
GOWN STRL REUS W/ TWL LRG LVL3 (GOWN DISPOSABLE) ×1 IMPLANT
GOWN STRL REUS W/ TWL XL LVL3 (GOWN DISPOSABLE) ×2 IMPLANT
GOWN STRL REUS W/TWL 2XL LVL3 (GOWN DISPOSABLE) ×3 IMPLANT
GOWN STRL REUS W/TWL LRG LVL3 (GOWN DISPOSABLE) ×3
GOWN STRL REUS W/TWL XL LVL3 (GOWN DISPOSABLE) ×6
HANDPIECE INTERPULSE COAX TIP (DISPOSABLE) ×3
HOOD PEEL AWAY FACE SHEILD DIS (HOOD) ×9 IMPLANT
KIT BASIN OR (CUSTOM PROCEDURE TRAY) ×3 IMPLANT
KIT ROOM TURNOVER OR (KITS) ×3 IMPLANT
KNEE CAPITATED TOTAL 3 IMPLANT
MANIFOLD NEPTUNE II (INSTRUMENTS) ×3 IMPLANT
NEEDLE 22X1 1/2 (OR ONLY) (NEEDLE) ×6 IMPLANT
NS IRRIG 1000ML POUR BTL (IV SOLUTION) ×3 IMPLANT
PACK TOTAL JOINT (CUSTOM PROCEDURE TRAY) ×3 IMPLANT
PACK UNIVERSAL I (CUSTOM PROCEDURE TRAY) ×3 IMPLANT
PAD ARMBOARD 7.5X6 YLW CONV (MISCELLANEOUS) ×6 IMPLANT
PADDING CAST ABS 6INX4YD NS (CAST SUPPLIES) ×2
PADDING CAST ABS COTTON 6X4 NS (CAST SUPPLIES) IMPLANT
PADDING CAST COTTON 6X4 STRL (CAST SUPPLIES) ×3 IMPLANT
SET HNDPC FAN SPRY TIP SCT (DISPOSABLE) ×1 IMPLANT
SPONGE GAUZE 4X4 12PLY STER LF (GAUZE/BANDAGES/DRESSINGS) ×2 IMPLANT
STAPLER SKIN 35 WIDE (STAPLE) ×2 IMPLANT
STAPLER VISISTAT 35W (STAPLE) ×3 IMPLANT
SUCTION FRAZIER HANDLE 10FR (MISCELLANEOUS) ×2
SUCTION TUBE FRAZIER 10FR DISP (MISCELLANEOUS) ×1 IMPLANT
SUT BONE WAX W31G (SUTURE) ×3 IMPLANT
SUT VIC AB 0 CTB1 27 (SUTURE) ×6 IMPLANT
SUT VIC AB 1 CT1 27 (SUTURE) ×6
SUT VIC AB 1 CT1 27XBRD ANBCTR (SUTURE) ×2 IMPLANT
SUT VIC AB 2-0 CT1 27 (SUTURE) ×6
SUT VIC AB 2-0 CT1 TAPERPNT 27 (SUTURE) ×2 IMPLANT
SYR 20CC LL (SYRINGE) ×6 IMPLANT
TOWEL OR 17X24 6PK STRL BLUE (TOWEL DISPOSABLE) ×3 IMPLANT
TOWEL OR 17X26 10 PK STRL BLUE (TOWEL DISPOSABLE) ×3 IMPLANT
TRAY FOLEY CATH 14FR (SET/KITS/TRAYS/PACK) ×2 IMPLANT
WATER STERILE IRR 1000ML POUR (IV SOLUTION) ×6 IMPLANT

## 2016-03-17 NOTE — Progress Notes (Signed)
Orthopedic Tech Progress Note Patient Details:  Mathew Leach 25-Dec-1950 [id number]  CPM Right Knee CPM Right Knee: On Right Knee Flexion (Degrees): 90 Right Knee Extension (Degrees): 0 Additional Comments: Trapeze bar and foot roll   [name] 03/17/2016, 11:33 AM

## 2016-03-17 NOTE — Anesthesia Procedure Notes (Addendum)
Anesthesia Regional Block:  Adductor canal block  Pre-Anesthetic Checklist: ,, timeout performed, Correct Patient, Correct Site, Correct Laterality, Correct Procedure, Correct Position, site marked, Risks and benefits discussed,  Surgical consent,  Pre-op evaluation,  At surgeon's request and post-op pain management  Laterality: Right  Prep: chloraprep       Needles:  Injection technique: Single-shot  Needle Type: Echogenic Needle     Needle Length: 9cm 9 cm Needle Gauge: 21 and 21 G    Additional Needles:  Procedures: ultrasound guided (picture in chart) Adductor canal block Narrative:  Start time: 03/17/2016 7:15 AM End time: 03/17/2016 7:20 AM Injection made incrementally with aspirations every 5 mL.  Performed by: Personally  Anesthesiologist: Suella Broad D  Additional Notes: Pt tolerated well. No immediate complications noted.    Spinal Patient location during procedure: OR Start time: 03/17/2016 8:58 AM End time: 03/17/2016 9:00 AM Staffing Anesthesiologist: Suella Broad D Performed by: anesthesiologist  Preanesthetic Checklist Completed: patient identified, site marked, surgical consent, pre-op evaluation, timeout performed, IV checked, risks and benefits discussed and monitors and equipment checked Spinal Block Patient position: sitting Prep: Betadine Patient monitoring: heart rate, continuous pulse ox, blood pressure and cardiac monitor Approach: midline Location: L4-5 Injection technique: single-shot Needle Needle type: Whitacre and Introducer  Needle gauge: 24 G Needle length: 9 cm Additional Notes Negative paresthesia. Negative blood return. Positive free-flowing CSF. Expiration date of kit checked and confirmed. Patient tolerated procedure well, without complications.

## 2016-03-17 NOTE — Progress Notes (Signed)
Report to [employer] as primary caregiver

## 2016-03-17 NOTE — H&P (Signed)
Mathew Leach MRN:  [id number] DOB/SEX:  10-03-1951/male  CHIEF COMPLAINT:  Painful right Knee  HISTORY: Patient is a 65 y.o. male presented with a history of pain in the right knee. Onset of symptoms was gradual starting a few years ago with gradually worsening course since that time. Patient has been treated conservatively with over-the-counter NSAIDs and activity modification. Patient currently rates pain in the knee at 10 out of 10 with activity. There is pain at night.  PAST MEDICAL HISTORY: There are no active problems to display for this patient.  Past Medical History  Diagnosis Date  . Hypertension   . Stroke ([place])     04-2015  . Dysrhythmia     AFIB  . Sleep apnea     CPAP  . Shortness of breath dyspnea     W/ EXERTION   . Pericarditis     1999   Past Surgical History  Procedure Laterality Date  . Inguinal hernia repair      LEFT AS CHILD  . Cyst removal neck      AGE 73     MEDICATIONS:   Prescriptions prior to admission  Medication Sig Dispense Refill Last Dose  . amLODipine-benazepril (LOTREL) 10-20 MG capsule Take 1 capsule by mouth daily.     Marland Kitchen ELIQUIS 5 MG TABS tablet Take 1 tablet by mouth 2 (two) times daily.     . Folic Acid-Vit Q000111Q 123456 0.8-50-0.1 MG TABS Take 1 tablet by mouth daily.     . Omega-3 Fatty Acids (FISH OIL) 1000 MG CAPS Take 1 capsule by mouth daily.     . simvastatin (ZOCOR) 20 MG tablet Take 20 mg by mouth daily.       ALLERGIES:   Allergies  Allergen Reactions  . Lipitor [Atorvastatin]     DOUBLE VISION    REVIEW OF SYSTEMS:  A comprehensive review of systems was negative except for: Musculoskeletal: positive for arthralgias and stiff joints   FAMILY HISTORY:  No family history on file.  SOCIAL HISTORY:   Social History  Substance Use Topics  . Smoking status: Never Smoker   . Smokeless tobacco: Not on file  . Alcohol Use: No     EXAMINATION:  Vital signs in last 24 hours:    There were no vitals taken for this  visit.  General Appearance:    Alert, cooperative, no distress, appears stated age  Head:    Normocephalic, without obvious abnormality, atraumatic  Eyes:    PERRL, conjunctiva/corneas clear, EOM's intact, fundi    benign, both eyes       Ears:    Normal TM's and external ear canals, both ears  Nose:   Nares normal, septum midline, mucosa normal, no drainage    or sinus tenderness  Throat:   Lips, mucosa, and tongue normal; teeth and gums normal  Neck:   Supple, symmetrical, trachea midline, no adenopathy;       thyroid:  No enlargement/tenderness/nodules; no carotid   bruit or JVD  Back:     Symmetric, no curvature, ROM normal, no CVA tenderness  Lungs:     Clear to auscultation bilaterally, respirations unlabored  Chest wall:    No tenderness or deformity  Heart:    Regular rate and rhythm, S1 and S2 normal, no murmur, rub   or gallop  Abdomen:     Soft, non-tender, bowel sounds active all four quadrants,    no masses, no organomegaly  Genitalia:    Normal male without  lesion, discharge or tenderness  Rectal:    Normal tone, normal prostate, no masses or tenderness;   guaiac negative stool  Extremities:   Extremities normal, atraumatic, no cyanosis or edema  Pulses:   2+ and symmetric all extremities  Skin:   Skin color, texture, turgor normal, no rashes or lesions  Lymph nodes:   Cervical, supraclavicular, and axillary nodes normal  Neurologic:   CNII-XII intact. Normal strength, sensation and reflexes      throughout    Musculoskeletal:  ROM 0-120, Ligaments intact,  Imaging Review Plain radiographs demonstrate severe degenerative joint disease of the right knee. The overall alignment is neutral. The bone quality appears to be excellent for age and reported activity level.  Assessment/Plan: Primary osteoarthritis, right knee   The patient history, physical examination and imaging studies are consistent with advanced degenerative joint disease of the right knee. The patient has  failed conservative treatment.  The clearance notes were reviewed.  After discussion with the patient it was felt that Total Knee Replacement was indicated. The procedure,  risks, and benefits of total knee arthroplasty were presented and reviewed. The risks including but not limited to aseptic loosening, infection, blood clots, vascular injury, stiffness, patella tracking problems complications among others were discussed. The patient acknowledged the explanation, agreed to proceed with the plan.  Donia Ast 03/17/2016, 6:36 AM

## 2016-03-17 NOTE — Evaluation (Signed)
Physical Therapy Evaluation Patient Details Name: Mathew Leach MRN: [id number] DOB: [birth year] Today's Date: 03/17/2016   History of Present Illness  Admitted for RTKA  Clinical Impression  Pt is s/p TKA resulting in the deficits listed below (see PT Problem List).  Pt will benefit from skilled PT to increase their independence and safety with mobility to allow discharge to the venue listed below.      Follow Up Recommendations Home health PT;Supervision - Intermittent    Equipment Recommendations  Rolling walker with 5" wheels;3in1 (PT)    Recommendations for Other Services       Precautions / Restrictions Precautions Precautions: Knee Precaution Booklet Issued: Yes (comment) Precaution Comments: Pt educated to not allow any pillow or bolster under knee for healing with optimal range of motion.  Restrictions Weight Bearing Restrictions: Yes RLE Weight Bearing: Weight bearing as tolerated      Mobility  Bed Mobility Overal bed mobility: Needs Assistance Bed Mobility: Supine to Sit     Supine to sit: Min guard     General bed mobility comments: Cues for technqiue  Transfers Overall transfer level: Needs assistance Equipment used: Rolling walker (2 wheeled) Transfers: Sit to/from Stand Sit to Stand: Min assist         General transfer comment: Min assist to steady  Ambulation/Gait Ambulation/Gait assistance: Min assist;+2 safety/equipment Ambulation Distance (Feet): 100 Feet Assistive device: Rolling walker (2 wheeled) Gait Pattern/deviations: Step-through pattern     General Gait Details: Cues to activate R quad for stance stability  Stairs            Wheelchair Mobility    Modified Rankin (Stroke Patients Only)       Balance Overall balance assessment: Needs assistance   Sitting balance-Leahy Scale: Good       Standing balance-Leahy Scale: Fair                               Pertinent Vitals/Pain Pain Assessment:  Faces Faces Pain Scale: Hurts even more Pain Location: R knee Pain Descriptors / Indicators: Aching Pain Intervention(s): Limited activity within patient's tolerance;Monitored during session    [place] expects to be discharged to:: Private residence Living Arrangements: Spouse/significant other Available Help at Discharge: Family;Available 24 hours/day Type of Home: House Home Access: Stairs to enter Entrance Stairs-Rails: Right Entrance Stairs-Number of Steps: 4 Home Layout: One level Home Equipment: Walker - 2 wheels;Bedside commode      Prior Function Level of Independence: Independent               Hand Dominance        Extremity/Trunk Assessment   Upper Extremity Assessment: Defer to OT evaluation           Lower Extremity Assessment: RLE deficits/detail RLE Deficits / Details: Decr ROM and strength, limted by pain postop       Communication   Communication: No difficulties  Cognition Arousal/Alertness: Awake/alert Behavior During Therapy: WFL for tasks assessed/performed Overall Cognitive Status: Within Functional Limits for tasks assessed                      General Comments      Exercises        Assessment/Plan    PT Assessment Patient needs continued PT services  PT Diagnosis Difficulty walking;Acute pain   PT Problem List Decreased strength;Decreased range of motion;Decreased activity tolerance;Decreased balance;Decreased knowledge of use of DME;Decreased knowledge  of precautions;Pain  PT Treatment Interventions DME instruction;Gait training;Stair training;Functional mobility training;Therapeutic activities;Therapeutic exercise;Patient/family education   PT Goals (Current goals can be found in the Care Plan section) Acute Rehab PT Goals Patient Stated Goal: wants to play with 90 month old grandson PT Goal Formulation: With patient Time For Goal Achievement: 03/24/16 Potential to Achieve Goals: Good     Frequency 7X/week   Barriers to discharge        Co-evaluation               End of Session Equipment Utilized During Treatment: Gait belt Activity Tolerance: Patient tolerated treatment well Patient left: in chair;with call bell/phone within reach;with family/visitor present Nurse Communication: Mobility status         Time: [id number] PT Time Calculation (min) (ACUTE ONLY): 22 min   Charges:   PT Evaluation $PT Eval Moderate Complexity: 1 Procedure     PT G CodesQuin Hoop 03/17/2016, 4:21 PM  [place] [place], [place]  Acute Rehabilitation Services Pager [phone] Office [phone]

## 2016-03-17 NOTE — Transfer of Care (Signed)
Immediate Anesthesia Transfer of Care Note  Patient: Mathew Leach  Procedure(s) Performed: Procedure(s): TOTAL KNEE ARTHROPLASTY (Right)  Patient Location: PACU  Anesthesia Type:MAC and Spinal  Level of Consciousness: awake, alert , oriented and sedated  Airway & Oxygen Therapy: Patient Spontanous Breathing and Patient connected to nasal cannula oxygen  Post-op Assessment: Report given to RN, Post -op Vital signs reviewed and stable and Patient moving all extremities  Post vital signs: Reviewed and stable  Last Vitals:  Filed Vitals:   03/17/16 0835 03/17/16 0840  BP:  141/76  Pulse: 78 80  Temp:    Resp: 19 21    Last Pain: There were no vitals filed for this visit.    Patients Stated Pain Goal: 2 (Q000111Q [id number])  Complications: No apparent anesthesia complications

## 2016-03-17 NOTE — Anesthesia Postprocedure Evaluation (Signed)
Anesthesia Post Note  Patient: Arib Ulch  Procedure(s) Performed: Procedure(s) (LRB): TOTAL KNEE ARTHROPLASTY (Right)  Patient location during evaluation: PACU Anesthesia Type: Spinal Level of consciousness: oriented and awake and alert Pain management: pain level controlled Vital Signs Assessment: post-procedure vital signs reviewed and stable Respiratory status: spontaneous breathing, respiratory function stable and patient connected to nasal cannula oxygen Cardiovascular status: blood pressure returned to baseline and stable Postop Assessment: no headache, no backache and spinal receding Anesthetic complications: no    Last Vitals:  Filed Vitals:   03/17/16 1145 03/17/16 1216  BP: 122/76 128/82  Pulse: 65 69  Temp:  36.5 C  Resp: 13 16    Last Pain:  Filed Vitals:   03/17/16 1217  PainSc: 0-No pain                 Effie Berkshire

## 2016-03-18 ENCOUNTER — Encounter (HOSPITAL_COMMUNITY): Payer: Self-pay | Admitting: Orthopedic Surgery

## 2016-03-18 LAB — CBC
HCT: 39.2 % (ref 39.0–52.0)
Hemoglobin: 13 g/dL (ref 13.0–17.0)
MCH: 29.2 pg (ref 26.0–34.0)
MCHC: 33.2 g/dL (ref 30.0–36.0)
MCV: 88.1 fL (ref 78.0–100.0)
Platelets: 255 10*3/uL (ref 150–400)
RBC: 4.45 MIL/uL (ref 4.22–5.81)
RDW: 13.1 % (ref 11.5–15.5)
WBC: 8.7 10*3/uL (ref 4.0–10.5)

## 2016-03-18 LAB — BASIC METABOLIC PANEL
Anion gap: 9 (ref 5–15)
BUN: 12 mg/dL (ref 6–20)
CO2: 23 mmol/L (ref 22–32)
Calcium: 8.8 mg/dL — ABNORMAL LOW (ref 8.9–10.3)
Chloride: 107 mmol/L (ref 101–111)
Creatinine, Ser: 1.2 mg/dL (ref 0.61–1.24)
GFR calc Af Amer: >60 mL/min (ref >60)
GFR calc non Af Amer: >60 mL/min (ref >60)
Glucose, Bld: 133 mg/dL — ABNORMAL HIGH (ref 65–99)
Potassium: 4.3 mmol/L (ref 3.5–5.1)
Sodium: 139 mmol/L (ref 135–145)

## 2016-03-18 MED ORDER — METHOCARBAMOL 500 MG PO TABS: 500.0000 mg | ORAL_TABLET | Freq: Four times a day (QID) | ORAL | Status: DC | PRN

## 2016-03-18 MED ORDER — OXYCODONE HCL ER 10 MG PO T12A: 10.0000 mg | EXTENDED_RELEASE_TABLET | Freq: Two times a day (BID) | ORAL | Status: DC

## 2016-03-18 MED ORDER — ONDANSETRON HCL 4 MG PO TABS: 4.0000 mg | ORAL_TABLET | Freq: Four times a day (QID) | ORAL | Status: DC | PRN

## 2016-03-18 MED ORDER — OXYCODONE HCL 5 MG PO TABS: 5.0000 mg | ORAL_TABLET | ORAL | Status: DC | PRN

## 2016-03-18 NOTE — Discharge Instructions (Signed)
Information on my medicine - ELIQUIS® (apixaban) ° °This medication education was reviewed with me or my healthcare representative as part of my discharge preparation.  ° °Why was Eliquis® prescribed for you? °Eliquis® was prescribed for you to reduce the risk of a blood clot forming that can cause a stroke if you have a medical condition called atrial fibrillation (a type of irregular heartbeat). ° °What do You need to know about Eliquis® ? °Take your Eliquis® 5mg TWICE DAILY - one tablet in the morning and one tablet in the evening with or without food. If you have difficulty swallowing the tablet whole please discuss with your pharmacist how to take the medication safely. ° °Take Eliquis® exactly as prescribed by your doctor and DO NOT stop taking Eliquis® without talking to the doctor who prescribed the medication.  Stopping may increase your risk of developing a stroke.  Refill your prescription before you run out. ° °After discharge, you should have regular check-up appointments with your healthcare provider that is prescribing your Eliquis®.  In the future your dose may need to be changed if your kidney function or weight changes by a significant amount or as you get older. ° °What do you do if you miss a dose? °If you miss a dose, take it as soon as you remember on the same day and resume taking twice daily.  Do not take more than one dose of ELIQUIS at the same time to make up a missed dose. ° °Important Safety Information °A possible side effect of Eliquis® is bleeding. You should call your healthcare provider right away if you experience any of the following: °? Bleeding from an injury or your nose that does not stop. °? Unusual colored urine (red or dark brown) or unusual colored stools (red or black). °? Unusual bruising for unknown reasons. °? A serious fall or if you hit your head (even if there is no bleeding). ° °Some medicines may interact with Eliquis® and might increase your risk of bleeding or  clotting while on Eliquis®. To help avoid this, consult your healthcare provider or pharmacist prior to using any new prescription or non-prescription medications, including herbals, vitamins, non-steroidal anti-inflammatory drugs (NSAIDs) and supplements. ° °This website has more information on Eliquis® (apixaban): http://www.eliquis.com/eliquis/home ° °

## 2016-03-18 NOTE — Discharge Summary (Signed)
SPORTS MEDICINE & JOINT REPLACEMENT   Lara Mulch, MD    Carlyon Shadow, PA-C [place], [place], [place]  [address]                             [id number]  PATIENT ID: Mathew Leach        MRN:  [id number]          DOB/AGE: 67-Dec-1952 / 67 y.o.    DISCHARGE SUMMARY  ADMISSION DATE:    03/17/2016 DISCHARGE DATE:   03/18/2016   ADMISSION DIAGNOSIS: primary osteoarthritis right knee    DISCHARGE DIAGNOSIS:  primary osteoarthritis right knee    ADDITIONAL DIAGNOSIS: Active Problems:   S/P total knee replacement  Past Medical History  Diagnosis Date  . Hypertension   . Stroke ([place])     04-2015  . Dysrhythmia     AFIB  . Sleep apnea     CPAP  . Shortness of breath dyspnea     W/ EXERTION   . Pericarditis     1999    PROCEDURE: Procedure(s): TOTAL KNEE ARTHROPLASTY on 03/17/2016  CONSULTS:     HISTORY:  See H&P in chart  HOSPITAL COURSE:  Aadil Kohring is a 65 y.o. admitted on 03/17/2016 and found to have a diagnosis of primary osteoarthritis right knee.  After appropriate laboratory studies were obtained  they were taken to the operating room on 03/17/2016 and underwent Procedure(s): TOTAL KNEE ARTHROPLASTY.   They were given perioperative antibiotics:  Anti-infectives    Start     Dose/Rate Route Frequency Ordered Stop   03/17/16 1500  ceFAZolin (ANCEF) IVPB 2g/100 mL premix     2 g 200 mL/hr over 30 Minutes Intravenous Every 6 hours 03/17/16 1300 03/17/16 2226   03/17/16 0815  ceFAZolin (ANCEF) IVPB 2g/100 mL premix     2 g 200 mL/hr over 30 Minutes Intravenous To Short Stay 03/16/16 1455 03/17/16 0905    .  Patient given tranexamic acid IV or topical and exparel intra-operatively.  Tolerated the procedure well.    POD# 1: Vital signs were stable.  Patient denied Chest pain, shortness of breath, or calf pain.  Patient was started on Lovenox 30 mg subcutaneously twice daily at 8am.  Consults to PT, OT, and care management were made.  The patient was weight  bearing as tolerated.  CPM was placed on the operative leg 0-90 degrees for 6-8 hours a day. When out of the CPM, patient was placed in the foam block to achieve full extension. Incentive spirometry was taught.  Dressing was changed.       POD #2, Continued  PT for ambulation and exercise program.  IV saline locked.  O2 discontinued.    The remainder of the hospital course was dedicated to ambulation and strengthening.   The patient was discharged on 1 Day Post-Op in  Good condition.  Blood products given:none  DIAGNOSTIC STUDIES: Recent vital signs: Patient Vitals for the past 24 hrs:  BP Temp Temp src Pulse Resp SpO2  03/18/16 0532 (!) 142/72 mmHg 98 F (36.7 C) - 72 16 98 %  03/17/16 2355 132/73 mmHg 98.1 F (36.7 C) Oral 68 16 98 %  03/17/16 2137 128/86 mmHg 98.1 F (36.7 C) Oral 70 16 96 %       Recent laboratory studies:  Recent Labs  03/18/16 0356  WBC 8.7  HGB 13.0  HCT 39.2  PLT 255  Recent Labs  03/18/16 0356  NA 139  K 4.3  CL 107  CO2 23  BUN 12  CREATININE 1.20  GLUCOSE 133*  CALCIUM 8.8*   Lab Results  Component Value Date   INR 1.19 03/06/2016     Recent Radiographic Studies :  Dg Chest 2 View  03/06/2016  CLINICAL DATA:  Preoperative evaluation for right total knee replacement. History of hypertension. EXAM: CHEST  2 VIEW COMPARISON:  May 26, 2014 FINDINGS: Lungs are clear. The heart size and pulmonary vascularity are normal. No adenopathy. There is degenerative change in the thoracic spine. IMPRESSION: No edema or consolidation. Electronically Signed   By: Lowella Grip III M.D.   On: 03/06/2016 13:48    DISCHARGE INSTRUCTIONS: Discharge Instructions    CPM    Complete by:  As directed   Continuous passive motion machine (CPM):      Use the CPM from 0 to 90 for 4-6 hours per day.      You may increase by 10 per day.  You may break it up into 2 or 3 sessions per day.      Use CPM for 2 weeks or until you are told to stop.     Call  MD / Call 911    Complete by:  As directed   If you experience chest pain or shortness of breath, CALL 911 and be transported to the hospital emergency room.  If you develope a fever above 101 F, pus (white drainage) or increased drainage or redness at the wound, or calf pain, call your surgeon's office.     Change dressing    Complete by:  As directed   Change dressing on tomorrow, then change the dressing daily with sterile 4 x 4 inch gauze dressing and apply TED hose.  You may clean the incision with alcohol prior to redressing.     Constipation Prevention    Complete by:  As directed   Drink plenty of fluids.  Prune juice may be helpful.  You may use a stool softener, such as Colace (over the counter) 100 mg twice a day.  Use MiraLax (over the counter) for constipation as needed.     Diet - low sodium heart healthy    Complete by:  As directed      Discharge instructions    Complete by:  As directed   INSTRUCTIONS AFTER JOINT REPLACEMENT   Remove items at home which could result in a fall. This includes throw rugs or furniture in walking pathways ICE to the affected joint every three hours while awake for 30 minutes at a time, for at least the first 3-5 days, and then as needed for pain and swelling.  Continue to use ice for pain and swelling. You may notice swelling that will progress down to the foot and ankle.  This is normal after surgery.  Elevate your leg when you are not up walking on it.   Continue to use the breathing machine you got in the hospital (incentive spirometer) which will help keep your temperature down.  It is common for your temperature to cycle up and down following surgery, especially at night when you are not up moving around and exerting yourself.  The breathing machine keeps your lungs expanded and your temperature down.   DIET:  As you were doing prior to hospitalization, we recommend a well-balanced diet.  DRESSING / WOUND CARE / SHOWERING  You may change your  dressing 3-5 days  after surgery.  Then change the dressing every day with sterile gauze.  Please use good hand washing techniques before changing the dressing.  Do not use any lotions or creams on the incision until instructed by your surgeon.  ACTIVITY  Increase activity slowly as tolerated, but follow the weight bearing instructions below.   No driving for 6 weeks or until further direction given by your physician.  You cannot drive while taking narcotics.  No lifting or carrying greater than 10 lbs. until further directed by your surgeon. Avoid periods of inactivity such as sitting longer than an hour when not asleep. This helps prevent blood clots.  You may return to work once you are authorized by your doctor.     WEIGHT BEARING   Weight bearing as tolerated with assist device (walker, cane, etc) as directed, use it as long as suggested by your surgeon or therapist, typically at least 4-6 weeks.   EXERCISES  Results after joint replacement surgery are often greatly improved when you follow the exercise, range of motion and muscle strengthening exercises prescribed by your doctor. Safety measures are also important to protect the joint from further injury. Any time any of these exercises cause you to have increased pain or swelling, decrease what you are doing until you are comfortable again and then slowly increase them. If you have problems or questions, call your caregiver or physical therapist for advice.   Rehabilitation is important following a joint replacement. After just a few days of immobilization, the muscles of the leg can become weakened and shrink (atrophy).  These exercises are designed to build up the tone and strength of the thigh and leg muscles and to improve motion. Often times heat used for twenty to thirty minutes before working out will loosen up your tissues and help with improving the range of motion but do not use heat for the first two weeks following surgery  (sometimes heat can increase post-operative swelling).   These exercises can be done on a training (exercise) mat, on the floor, on a table or on a bed. Use whatever works the best and is most comfortable for you.    Use music or television while you are exercising so that the exercises are a pleasant break in your day. This will make your life better with the exercises acting as a break in your routine that you can look forward to.   Perform all exercises about fifteen times, three times per day or as directed.  You should exercise both the operative leg and the other leg as well.   Exercises include:   Quad Sets - Tighten up the muscle on the front of the thigh (Quad) and hold for 5-10 seconds.   Straight Leg Raises - With your knee straight (if you were given a brace, keep it on), lift the leg to 60 degrees, hold for 3 seconds, and slowly lower the leg.  Perform this exercise against resistance later as your leg gets stronger.  Leg Slides: Lying on your back, slowly slide your foot toward your buttocks, bending your knee up off the floor (only go as far as is comfortable). Then slowly slide your foot back down until your leg is flat on the floor again.  Angel Wings: Lying on your back spread your legs to the side as far apart as you can without causing discomfort.  Hamstring Strength:  Lying on your back, push your heel against the floor with your leg straight by tightening up  the muscles of your buttocks.  Repeat, but this time bend your knee to a comfortable angle, and push your heel against the floor.  You may put a pillow under the heel to make it more comfortable if necessary.   A rehabilitation program following joint replacement surgery can speed recovery and prevent re-injury in the future due to weakened muscles. Contact your doctor or a physical therapist for more information on knee rehabilitation.    CONSTIPATION  Constipation is defined medically as fewer than three stools per week  and severe constipation as less than one stool per week.  Even if you have a regular bowel pattern at home, your normal regimen is likely to be disrupted due to multiple reasons following surgery.  Combination of anesthesia, postoperative narcotics, change in appetite and fluid intake all can affect your bowels.   YOU MUST use at least one of the following options; they are listed in order of increasing strength to get the job done.  They are all available over the counter, and you may need to use some, POSSIBLY even all of these options:    Drink plenty of fluids (prune juice may be helpful) and high fiber foods Colace 100 mg by mouth twice a day  Senokot for constipation as directed and as needed Dulcolax (bisacodyl), take with full glass of water  Miralax (polyethylene glycol) once or twice a day as needed.  If you have tried all these things and are unable to have a bowel movement in the first 3-4 days after surgery call either your surgeon or your primary doctor.    If you experience loose stools or diarrhea, hold the medications until you stool forms back up.  If your symptoms do not get better within 1 week or if they get worse, check with your doctor.  If you experience "the worst abdominal pain ever" or develop nausea or vomiting, please contact the office immediately for further recommendations for treatment.   ITCHING:  If you experience itching with your medications, try taking only a single pain pill, or even half a pain pill at a time.  You can also use Benadryl over the counter for itching or also to help with sleep.   TED HOSE STOCKINGS:  Use stockings on both legs until for at least 2 weeks or as directed by physician office. They may be removed at night for sleeping.  MEDICATIONS:  See your medication summary on the "After Visit Summary" that nursing will review with you.  You may have some home medications which will be placed on hold until you complete the course of blood  thinner medication.  It is important for you to complete the blood thinner medication as prescribed.  PRECAUTIONS:  If you experience chest pain or shortness of breath - call 911 immediately for transfer to the hospital emergency department.   If you develop a fever greater that 101 F, purulent drainage from wound, increased redness or drainage from wound, foul odor from the wound/dressing, or calf pain - CONTACT YOUR SURGEON.                                                   FOLLOW-UP APPOINTMENTS:  If you do not already have a post-op appointment, please call the office for an appointment to be seen by your surgeon.  Guidelines for  how soon to be seen are listed in your "After Visit Summary", but are typically between 1-4 weeks after surgery.  OTHER INSTRUCTIONS:   Knee Replacement:  Do not place pillow under knee, focus on keeping the knee straight while resting. CPM instructions: 0-90 degrees, 2 hours in the morning, 2 hours in the afternoon, and 2 hours in the evening. Place foam block, curve side up under heel at all times except when in CPM or when walking.  DO NOT modify, tear, cut, or change the foam block in any way.  MAKE SURE YOU:  Understand these instructions.  Get help right away if you are not doing well or get worse.    Thank you for letting us be a part of your medical care team.  It is a privilege we respect greatly.  We hope these instructions will help you stay on track for a fast and full recovery!     Driving restrictions    Complete by:  As directed   No driving for 4 weeks     Increase activity slowly as tolerated    Complete by:  As directed            DISCHARGE MEDICATIONS:     Medication List    TAKE these medications        amLODipine-benazepril 10-20 MG capsule  Commonly known as:  LOTREL  Take 1 capsule by mouth daily.     ELIQUIS 5 MG Tabs tablet  Generic drug:  apixaban  Take 1 tablet by mouth 2 (two) times daily.     Fish Oil 1000 MG Caps   Take 1 capsule by mouth daily.     Folic Acid-Vit [id number] 0.8-50-0.1 MG Tabs  Take 1 tablet by mouth daily.     methocarbamol 500 MG tablet  Commonly known as:  ROBAXIN  Take 1-2 tablets (500-1,000 mg total) by mouth every 6 (six) hours as needed for muscle spasms.     ondansetron 4 MG tablet  Commonly known as:  ZOFRAN  Take 1 tablet (4 mg total) by mouth every 6 (six) hours as needed for nausea.     oxyCODONE 5 MG immediate release tablet  Commonly known as:  Oxy IR/ROXICODONE  Take 1-2 tablets (5-10 mg total) by mouth every 3 (three) hours as needed for breakthrough pain.     oxyCODONE 10 mg 12 hr tablet  Commonly known as:  OXYCONTIN  Take 1 tablet (10 mg total) by mouth every 12 (twelve) hours.     simvastatin 20 MG tablet  Commonly known as:  ZOCOR  Take 20 mg by mouth daily.        FOLLOW UP VISIT:       Follow-up Information    Follow up with Rudean Haskell, MD. Call on 04/01/2016.   Specialty:  Orthopedic Surgery   Contact information:   [place] [address] [phone]       DISPOSITION: HOME VS. SNF  CONDITION:  Good   Donia Ast 03/18/2016, 12:35 PM

## 2016-03-18 NOTE — Progress Notes (Signed)
Discharge instructions RX's and follow up appts explained/provided to patient and wife verbalized understanding. Home health set up for patient and equipment delivered to home. Patient left floor via wheelchair accompanied staff no c/o pain  or shortness of breath at d/c.   Jonasia Coiner, Tivis Ringer, RN

## 2016-03-18 NOTE — Progress Notes (Signed)
Physical Therapy Treatment Patient Details Name: Mathew Leach MRN: [id number] DOB: 08-15-1951 Today's Date: 03/18/2016    History of Present Illness Admitted for RTKA    PT Comments    Pt educated on HEP and stair training.  Pt able to recall precautions and follow directions during HEP.  Wife present and observed tx.  Pt ready to d/c home and RN informed.    Follow Up Recommendations  Home health PT;Supervision - Intermittent     Equipment Recommendations  Rolling walker with 5" wheels;3in1 (PT)    Recommendations for Other Services       Precautions / Restrictions Precautions Precautions: Knee;Fall Precaution Booklet Issued: Yes (comment) Restrictions Weight Bearing Restrictions: No RLE Weight Bearing: Weight bearing as tolerated    Mobility  Bed Mobility               General bed mobility comments: Pt received in recliner chair.    Transfers Overall transfer level: Needs assistance Equipment used: Rolling walker (2 wheeled) Transfers: Sit to/from Stand Sit to Stand: Supervision         General transfer comment: Remains to required cues for hand placement and anterior positioning of RLE.    Ambulation/Gait Ambulation/Gait assistance: Min guard;Min assist Ambulation Distance (Feet): 440 Feet Assistive device: Rolling walker (2 wheeled) Gait Pattern/deviations: Step-through pattern;Antalgic;Decreased stride length;Trunk flexed   Gait velocity interpretation: Below normal speed for age/gender General Gait Details: Pt remains to required cues for gait symmetry (to avoid stomping with LLE), forward gaze, upper trunk control and increasing B stride length.     Stairs Stairs: Yes Stairs assistance: Min guard Stair Management: Sideways;One rail Right Number of Stairs: 4 General stair comments: Cues for sequencing and hand placement on railing to improve ease and maintain stability.  Wife present and observed stair negotiation.    Wheelchair Mobility     Modified Rankin (Stroke Patients Only)       Balance Overall balance assessment: Needs assistance   Sitting balance-Leahy Scale: Good       Standing balance-Leahy Scale: Fair                      Cognition Arousal/Alertness: Awake/alert Behavior During Therapy: WFL for tasks assessed/performed Overall Cognitive Status: Within Functional Limits for tasks assessed                      Exercises Total Joint Exercises Ankle Circles/Pumps: AROM;Both;20 reps;Supine Quad Sets: AROM;Right;10 reps;Supine Short Arc Quad: AAROM;Right;10 reps;Supine Heel Slides: AAROM;Right;10 reps;Supine Hip ABduction/ADduction: AAROM;Right;10 reps;Supine Straight Leg Raises: AAROM;10 reps;Supine;Right Long Arc Quad: AAROM;Right;10 reps;Seated;AROM Knee Flexion: AROM;AAROM;Right;5 reps;Seated (2x5 reps-AROMx5 with 10 sec hold and AAROMx5 with 10 sec hold.)    General Comments        Pertinent Vitals/Pain Pain Assessment: 0-10 Pain Score: 5  Pain Location: R knee Pain Descriptors / Indicators: Sore Pain Intervention(s): Monitored during session;Repositioned;Ice applied    Home Living                      Prior Function            PT Goals (current goals can now be found in the care plan section) Acute Rehab PT Goals Patient Stated Goal: wants to play with 15 month old grandson Potential to Achieve Goals: Good Progress towards PT goals: Progressing toward goals    Frequency  7X/week    PT Plan      Co-evaluation  End of Session Equipment Utilized During Treatment: Gait belt Activity Tolerance: Patient tolerated treatment well Patient left: in chair;with call bell/phone within reach;with family/visitor present     Time: 1322-1409 PT Time Calculation (min) (ACUTE ONLY): 47 min  Charges:  $Gait Training: 23-37 mins $Therapeutic Exercise: 8-22 mins                    G Codes:      Cristela Blue Apr 05, 2016, 2:36 PM  Governor Rooks, PTA pager [phone]

## 2016-03-18 NOTE — Evaluation (Signed)
Occupational Therapy Evaluation Patient Details Name: Mathew Leach MRN: [id number] DOB: 1951-08-22 Today's Date: 03/18/2016    History of Present Illness Admitted for RTKA   Clinical Impression   This 65 yo male admitted with above presents to acute OT with deficits below (see OT problem list) thus affecting his PLOF of independent with basic ADLS. He will benefit from acute OT with follow up OT at home.    Follow Up Recommendations  Home health OT    Equipment Recommendations  3 in 1 bedside comode       Precautions / Restrictions Precautions Precautions: Knee;Fall Restrictions Weight Bearing Restrictions: No RLE Weight Bearing: Weight bearing as tolerated      Mobility Bed Mobility Overal bed mobility: Needs Assistance Bed Mobility: Supine to Sit     Supine to sit: Min guard     General bed mobility comments: no rails and HOB flat coming out on left  Transfers Overall transfer level: Needs assistance Equipment used: Rolling walker (2 wheeled) Transfers: Sit to/from Stand Sit to Stand: Min guard              Balance Overall balance assessment: Needs assistance Sitting-balance support: No upper extremity supported;Feet supported Sitting balance-Leahy Scale: Good     Standing balance support: Single extremity supported;During functional activity   Standing balance comment: static standing fair, dynamic standing zero                            ADL Overall ADL's : Needs assistance/impaired Eating/Feeding: Independent;Sitting   Grooming: Min guard;Standing;Wash/dry hands   Upper Body Bathing: Set up;Sitting   Lower Body Bathing: Moderate assistance (min guard A sit<>stand)   Upper Body Dressing : Set up;Sitting   Lower Body Dressing: Maximal assistance (min guard A sit<>stand)   Toilet Transfer: Min guard;Ambulation;RW   Toileting- Clothing Manipulation and Hygiene: Min guard (standing to urinate) Toileting - Clothing Manipulation  Details (indicate cue type and reason): Min guard A sit<>stand from recliner                       Pertinent Vitals/Pain Pain Assessment: 0-10 Pain Score: 3  (went up to 6 post near fall) Pain Descriptors / Indicators: Aching;Sore Pain Intervention(s): Monitored during session;Repositioned;Ice applied;Patient requesting pain meds-RN notified;RN gave pain meds during session     Hand Dominance Right   Extremity/Trunk Assessment Upper Extremity Assessment Upper Extremity Assessment: Overall WFL for tasks assessed           Communication Communication Communication: No difficulties   Cognition Arousal/Alertness: Awake/alert Behavior During Therapy: WFL for tasks assessed/performed Overall Cognitive Status: Within Functional Limits for tasks assessed                                Home Living Family/patient expects to be discharged to:: Private residence Living Arrangements: Spouse/significant other Available Help at Discharge: Family;Available 24 hours/day Type of Home: House Home Access: Stairs to enter [employer] of Steps: 4 Entrance Stairs-Rails: Right Home Layout: One level     Bathroom Shower/Tub: Tub/shower unit;Door Shower/tub characteristics: Door Bathroom Toilet: Handicapped height     Home Equipment: Environmental consultant - 2 wheels          Prior Functioning/Environment Level of Independence: Independent             OT Diagnosis: Generalized weakness;Acute pain   OT Problem List: Decreased strength;Decreased  range of motion;Pain;Impaired balance (sitting and/or standing);Decreased knowledge of use of DME or AE   OT Treatment/Interventions: Self-care/ADL training;Balance training;Patient/family education;DME and/or AE instruction;Therapeutic activities    OT Goals(Current goals can be found in the care plan section) Acute Rehab OT Goals Patient Stated Goal: wants to play with 82 month old grandson OT Goal Formulation: With  patient Time For Goal Achievement: 03/25/16 Potential to Achieve Goals: Good  OT Frequency: Min 2X/week              End of Session Equipment Utilized During Treatment: Gait belt;Rolling walker Nurse Communication: Mobility status;Patient requests pain meds (almost fall)  Activity Tolerance:  (increased pain after he lost balance backwards) Patient left: in chair;with call bell/phone within reach;with chair alarm set   Time: [phone] OT Time Calculation (min): 37 min Charges:  OT General Charges $OT Visit: 1 Procedure OT Evaluation $OT Eval Moderate Complexity: 1 Procedure OT Treatments $Self Care/Home Management : 8-22 mins  Almon Register [id number] 03/18/2016, 9:28 AM

## 2016-03-18 NOTE — Progress Notes (Signed)
Physical Therapy Treatment Patient Details Name: Mathew Leach MRN: [id number] DOB: August 31, 1951 Today's Date: 03/18/2016    History of Present Illness Admitted for RTKA    PT Comments    Pt required min to min guard to maintain stability and progress activity during PT intervention.  Pt progressing well and will require pm visit to perform stair training and review of HEP before d/c.    Follow Up Recommendations  Home health PT;Supervision - Intermittent     Equipment Recommendations  Rolling walker with 5" wheels;3in1 (PT)    Recommendations for Other Services       Precautions / Restrictions Precautions Precautions: Knee;Fall Precaution Booklet Issued: Yes (comment) Restrictions Weight Bearing Restrictions: No RLE Weight Bearing: Weight bearing as tolerated    Mobility  Bed Mobility Overal bed mobility: Needs Assistance Bed Mobility: Supine to Sit     Supine to sit: Min guard     General bed mobility comments: Pt received in recliner chair.    Transfers Overall transfer level: Needs assistance Equipment used: Rolling walker (2 wheeled) Transfers: Sit to/from Stand Sit to Stand: Min guard         General transfer comment: Min assist to steady, posterior lean, no true LOB required multimodal cueing for anterior weight shifting.    Ambulation/Gait Ambulation/Gait assistance: Min guard;Min assist Ambulation Distance (Feet): 400 Feet Assistive device: Rolling walker (2 wheeled) Gait Pattern/deviations: Step-through pattern;Antalgic;Decreased stance time - right;Decreased step length - left;Trunk flexed     General Gait Details: Cues for R heel strike and quad activation in stance phase, cues for R weight shifting, cues for upper trunk control, and gait symmetry.  Progressed to step through sequencing with uneven stride lengths.     Stairs            Wheelchair Mobility    Modified Rankin (Stroke Patients Only)       Balance Overall balance  assessment: Needs assistance Sitting-balance support: No upper extremity supported;Feet supported Sitting balance-Leahy Scale: Good     Standing balance support: Single extremity supported;During functional activity Standing balance-Leahy Scale: Fair Standing balance comment: static standing fair, dynamic standing zero                    Cognition Arousal/Alertness: Awake/alert Behavior During Therapy: WFL for tasks assessed/performed Overall Cognitive Status: Within Functional Limits for tasks assessed                      Exercises Total Joint Exercises Ankle Circles/Pumps: AROM;Both;20 reps;Supine Quad Sets: AROM;Right;10 reps;Supine Heel Slides: AAROM;Right;10 reps;Supine Hip ABduction/ADduction: AAROM;Right;10 reps;Supine Straight Leg Raises: AAROM;10 reps;Supine;Right Goniometric ROM: 13-84 degrees in R knee/AROM    General Comments        Pertinent Vitals/Pain Pain Assessment: 0-10 Pain Score: 8  Pain Location: R knee Pain Descriptors / Indicators: Aching;Sore Pain Intervention(s): Monitored during session;Repositioned;Ice applied    Home Living Family/patient expects to be discharged to:: Private residence Living Arrangements: Spouse/significant other Available Help at Discharge: Family;Available 24 hours/day Type of Home: House Home Access: Stairs to enter Entrance Stairs-Rails: Right Home Layout: One level Home Equipment: Environmental consultant - 2 wheels      Prior Function Level of Independence: Independent          PT Goals (current goals can now be found in the care plan section) Acute Rehab PT Goals Patient Stated Goal: wants to play with 34 month old grandson Potential to Achieve Goals: Good Progress towards PT goals: Progressing toward goals  Frequency  7X/week    PT Plan      Co-evaluation             End of Session Equipment Utilized During Treatment: Gait belt Activity Tolerance: Patient tolerated treatment well Patient  left: in chair;with call bell/phone within reach;with family/visitor present     Time: [id number] PT Time Calculation (min) (ACUTE ONLY): 25 min  Charges:  $Gait Training: 8-22 mins $Therapeutic Exercise: 8-22 mins                    G Codes:      Mathew Leach Mar 30, 2016, 10:07 AM  Governor Rooks, PTA pager [phone]

## 2016-03-18 NOTE — Op Note (Addendum)
TOTAL KNEE REPLACEMENT OPERATIVE NOTE:  03/17/2016  5:53 PM  PATIENT:  Mathew Leach  65 y.o. male  PRE-OPERATIVE DIAGNOSIS:  primary osteoarthritis right knee  POST-OPERATIVE DIAGNOSIS:  primary osteoarthritis right knee  PROCEDURE:  Procedure(s): TOTAL KNEE ARTHROPLASTY  SURGEON:  Surgeon(s): [name], MD  PHYSICIAN ASSISTANT: Buck Mam, PAC   ANESTHESIA:   spinal  DRAINS: Hemovac  SPECIMEN: None  COUNTS:  Correct  TOURNIQUET:   Total Tourniquet Time Documented: Thigh (Right) - 20160 minutes Total: Thigh (Right) - 20160 minutes   DICTATION:  Indication for procedure:    The patient is a 65 y.o. male who has failed conservative treatment for primary osteoarthritis right knee.  Informed consent was obtained prior to anesthesia. The risks versus benefits of the operation were explain and in a way the patient can, and did, understand.   On the implant demand matching protocol, this patient scored 10.  Therefore, this patient did" "did not receive a polyethylene insert with vitamin E which is a high demand implant.  Description of procedure:     The patient was taken to the operating room and placed under anesthesia.  The patient was positioned in the usual fashion taking care that all body parts were adequately padded and/or protected.  I foley catheter was not placed.  A tourniquet was applied and the leg prepped and draped in the usual sterile fashion.  The extremity was exsanguinated with the esmarch and tourniquet inflated to 350 mmHg.  Pre-operative range of motion was normal.  The knee was in 5 degree of mild varus.  A midline incision approximately 6-7 inches long was made with a #10 blade.  A new blade was used to make a parapatellar arthrotomy going 2-3 cm into the quadriceps tendon, over the patella, and alongside the medial aspect of the patellar tendon.  A synovectomy was then performed with the #10 blade and forceps. I then elevated the deep MCL off  the medial tibial metaphysis subperiosteally around to the semimembranosus attachment.    I everted the patella and used calipers to measure patellar thickness.  I used the reamer to ream down to appropriate thickness to recreate the native thickness.  I then removed excess bone with the rongeur and sagittal saw.  I used the appropriately sized template and drilled the three lug holes.  I then put the trial in place and measured the thickness with the calipers to ensure recreation of the native thickness.  The trial was then removed and the patella subluxed and the knee brought into flexion.  A homan retractor was place to retract and protect the patella and lateral structures.  A Z-retractor was place medially to protect the medial structures.  The extra-medullary alignment system was used to make cut the tibial articular surface perpendicular to the anamotic axis of the tibia and in 3 degrees of posterior slope.  The cut surface and alignment jig was removed.  I then used the intramedullary alignment guide to make a 6 valgus cut on the distal femur.  I then marked out the epicondylar axis on the distal femur.  The posterior condylar axis measured 3 degrees.  I then used the anterior referencing sizer and measured the femur to be a size 9.  The 4-In-1 cutting block was screwed into place in external rotation matching the posterior condylar angle, making our cuts perpendicular to the epicondylar axis.  Anterior, posterior and chamfer cuts were made with the sagittal saw.  The cutting block and cut  pieces were removed.  A lamina spreader was placed in 90 degrees of flexion.  The ACL, PCL, menisci, and posterior condylar osteophytes were removed.  A 11 mm spacer blocked was found to offer good flexion and extension gap balance after minimal in degree releasing.   The scoop retractor was then placed and the femoral finishing block was pinned in place.  The small sagittal saw was used as well as the lug drill to  finish the femur.  The block and cut surfaces were removed and the medullary canal hole filled with autograft bone from the cut pieces.  The tibia was delivered forward in deep flexion and external rotation.  A size F tray was selected and pinned into place centered on the medial 1/3 of the tibial tubercle.  The reamer and keel was used to prepare the tibia through the tray.    I then trialed with the size 9 femur, size F tibia, a 11 mm insert and the 35 patella.  I had excellent flexion/extension gap balance, excellent patella tracking.  Flexion was full and beyond 120 degrees; extension was zero.  These components were chosen and the staff opened them to me on the back table while the knee was lavaged copiously and the cement mixed.  The soft tissue was infiltrated with 60cc of exparel 1.3% through a 21 gauge needle.  I cemented in the components and removed all excess cement.  The polyethylene tibial component was snapped into place and the knee placed in extension while cement was hardening.  The capsule was infilltrated with 30cc of .25% Marcaine with epinephrine.  A hemovac was place in the joint exiting superolaterally.  A pain pump was place superomedially superficial to the arthrotomy.  Once the cement was hard, the tourniquet was let down.  Hemostasis was obtained.  The arthrotomy was closed with figure-8 #1 vicryl sutures.  The deep soft tissues were closed with #0 vicryls and the subcuticular layer closed with a running #2-0 vicryl.  The skin was reapproximated and closed with skin staples.  The wound was dressed with xeroform, 4 x4's, 2 ABD sponges, a single layer of webril and a TED stocking.   The patient was then awakened, extubated, and taken to the recovery room in stable condition.  BLOOD LOSS:  300cc DRAINS: 1 hemovac, 1 pain catheter COMPLICATIONS:  None.  PLAN OF CARE: Admit to inpatient   PATIENT DISPOSITION:  PACU - hemodynamically stable.   Delay start of Pharmacological  VTE agent (>24hrs) due to surgical blood loss or risk of bleeding:  not applicable  Please fax a copy of this op note to my office at [phone] (please only include page 1 and 2 of the Case Information op note)

## 2016-03-18 NOTE — Progress Notes (Signed)
SPORTS MEDICINE AND JOINT REPLACEMENT  Lara Mulch, MD   [name] PA-C [address], [place], [place]  [address]                             [phone]   PROGRESS NOTE  Subjective:  negative for Chest Pain  negative for Shortness of Breath  negative for Nausea/Vomiting   negative for Calf Pain  negative for Bowel Movement   Tolerating Diet: yes         Patient reports pain as 5 on 0-10 scale.    Objective: Vital signs in last 24 hours:   Patient Vitals for the past 24 hrs:  BP Temp Temp src Pulse Resp SpO2  03/18/16 0532 (!) 142/72 mmHg 98 F (36.7 C) - 72 16 98 %  03/17/16 2355 132/73 mmHg 98.1 F (36.7 C) Oral 68 16 98 %  03/17/16 2137 128/86 mmHg 98.1 F (36.7 C) Oral 70 16 96 %  03/17/16 1216 128/82 mmHg 97.7 F (36.5 C) Oral 69 16 98 %  03/17/16 1145 122/76 mmHg - - 65 13 97 %  03/17/16 1130 121/80 mmHg - - - - -  03/17/16 1115 119/76 mmHg - - 70 16 98 %  03/17/16 1059 - - - 70 16 100 %  03/17/16 1047 - 97.6 F (36.4 C) - - - -  03/17/16 1046 106/72 mmHg - - - - -  03/17/16 0840 (!) 141/76 mmHg - - 80 (!) 21 99 %  03/17/16 0835 - - - 78 19 98 %  03/17/16 0830 - - - 79 16 99 %  03/17/16 0825 119/65 mmHg - - 82 18 99 %  03/17/16 0820 - - - 76 18 100 %  03/17/16 0815 - - - 76 20 100 %  03/17/16 0810 131/71 mmHg - - 75 19 99 %  03/17/16 0805 - - - 77 19 98 %  03/17/16 0800 - - - 78 17 97 %  03/17/16 0755 136/62 mmHg - - 78 19 97 %  03/17/16 0750 - - - 77 16 96 %  03/17/16 0745 128/74 mmHg - - 78 16 96 %  03/17/16 0740 121/67 mmHg - - 75 19 97 %  03/17/16 0739 - - - 75 13 94 %  03/17/16 0738 130/65 mmHg - - 76 18 95 %  03/17/16 0737 - - - 76 15 95 %  03/17/16 0736 - - - 78 18 95 %  03/17/16 0735 - - - 78 13 94 %  03/17/16 0733 120/72 mmHg - - 77 17 96 %  03/17/16 0730 122/77 mmHg - - 80 18 96 %  03/17/16 0727 - - - 78 19 94 %  03/17/16 0725 124/75 mmHg - - 77 18 95 %  03/17/16 0720 122/77 mmHg - - 66 15 95 %  03/17/16 0715 - - - 65 15 99 %   03/17/16 0713 - - - 73 17 99 %  03/17/16 0712 124/75 mmHg - - 67 18 98 %    @flow {1959:LAST@   Intake/Output from previous day:   05/01 0701 - 05/02 0700 In: 1193.8 [I.V.:993.8] Out: 800 [Urine:700]   Intake/Output this shift:       Intake/Output      05/01 0701 - 05/02 0700 05/02 0701 - 05/03 0700   I.V. (mL/kg) 993.8 (12.1)    IV Piggyback 200    Total Intake(mL/kg) 1193.8 (14.5)  Urine (mL/kg/hr) 700 (0.4)    Blood 100 (0.1)    Total Output 800     Net +393.8             LABORATORY DATA:  Recent Labs  03/18/16 0356  WBC 8.7  HGB 13.0  HCT 39.2  PLT 255    Recent Labs  03/18/16 0356  NA 139  K 4.3  CL 107  CO2 23  BUN 12  CREATININE 1.20  GLUCOSE 133*  CALCIUM 8.8*   Lab Results  Component Value Date   INR 1.19 03/06/2016    Examination:  General appearance: alert, cooperative and no distress Extremities: extremities normal, atraumatic, no cyanosis or edema  Wound Exam: clean, dry, intact   Drainage:  None: wound tissue dry  Motor Exam: Quadriceps and Hamstrings Intact  Sensory Exam: Superficial Peroneal, Deep Peroneal and Tibial normal   Assessment:    1 Day Post-Op  Procedure(s) (LRB): TOTAL KNEE ARTHROPLASTY (Right)  ADDITIONAL DIAGNOSIS:  Active Problems:   S/P total knee replacement  Acute Blood Loss Anemia   Plan: Physical Therapy as ordered Weight Bearing as Tolerated (WBAT)  DVT Prophylaxis:  Eliquis  DISCHARGE PLAN: Home  DISCHARGE NEEDS: HHPT   Expect D/C today once cleared from PT         Donia Ast 03/18/2016, 7:05 AM

## 2016-03-18 NOTE — Care Management Note (Signed)
Case Management Note  Patient Details  Name: Mathew Leach MRN: [id number] Date of Birth: 1951-03-30  Subjective/Objective:                 S/p right total knee arthroplasty   Action/Plan: Set up with [facility] for HHPT by MD office. Spoke with patient, no change in discharge plan. Patient's wife will be assisting him after discharge. [employer] delivered CPM, rolling walker and 3N1 to home.   Expected Discharge Date:                  Expected Discharge Plan:  [place]  In-House Referral:  NA  Discharge planning Services  CM Consult  Post Acute Care Choice:  Durable Medical Equipment, Home Health Choice offered to:  Patient  DME Arranged:  3-N-1, CPM, Walker rolling DME Agency:  [employer]  HH Arranged:  PT HH Agency:  [place]  Status of Service:  Completed, signed off  Medicare Important Message Given:    Date Medicare IM Given:    Medicare IM give by:    Date Additional Medicare IM Given:    Additional Medicare Important Message give by:     If discussed at [place] of Stay Meetings, dates discussed:    Additional Comments:  Nila Nephew, RN 03/18/2016, 12:59 PM

## 2016-03-19 DIAGNOSIS — Z8673 Personal history of transient ischemic attack (TIA), and cerebral infarction without residual deficits: Secondary | ICD-10-CM | POA: Diagnosis not present

## 2016-03-19 DIAGNOSIS — I4891 Unspecified atrial fibrillation: Secondary | ICD-10-CM | POA: Diagnosis not present

## 2016-03-19 DIAGNOSIS — I10 Essential (primary) hypertension: Secondary | ICD-10-CM | POA: Diagnosis not present

## 2016-03-19 DIAGNOSIS — Z96651 Presence of right artificial knee joint: Secondary | ICD-10-CM | POA: Diagnosis not present

## 2016-03-19 DIAGNOSIS — Z471 Aftercare following joint replacement surgery: Secondary | ICD-10-CM | POA: Diagnosis not present

## 2016-03-20 DIAGNOSIS — I4891 Unspecified atrial fibrillation: Secondary | ICD-10-CM | POA: Diagnosis not present

## 2016-03-20 DIAGNOSIS — Z96651 Presence of right artificial knee joint: Secondary | ICD-10-CM | POA: Diagnosis not present

## 2016-03-20 DIAGNOSIS — Z8673 Personal history of transient ischemic attack (TIA), and cerebral infarction without residual deficits: Secondary | ICD-10-CM | POA: Diagnosis not present

## 2016-03-20 DIAGNOSIS — Z471 Aftercare following joint replacement surgery: Secondary | ICD-10-CM | POA: Diagnosis not present

## 2016-03-20 DIAGNOSIS — I10 Essential (primary) hypertension: Secondary | ICD-10-CM | POA: Diagnosis not present

## 2016-03-21 DIAGNOSIS — Z8673 Personal history of transient ischemic attack (TIA), and cerebral infarction without residual deficits: Secondary | ICD-10-CM | POA: Diagnosis not present

## 2016-03-21 DIAGNOSIS — Z471 Aftercare following joint replacement surgery: Secondary | ICD-10-CM | POA: Diagnosis not present

## 2016-03-21 DIAGNOSIS — Z96651 Presence of right artificial knee joint: Secondary | ICD-10-CM | POA: Diagnosis not present

## 2016-03-21 DIAGNOSIS — I10 Essential (primary) hypertension: Secondary | ICD-10-CM | POA: Diagnosis not present

## 2016-03-21 DIAGNOSIS — I4891 Unspecified atrial fibrillation: Secondary | ICD-10-CM | POA: Diagnosis not present

## 2016-03-22 DIAGNOSIS — Z8673 Personal history of transient ischemic attack (TIA), and cerebral infarction without residual deficits: Secondary | ICD-10-CM | POA: Diagnosis not present

## 2016-03-22 DIAGNOSIS — I10 Essential (primary) hypertension: Secondary | ICD-10-CM | POA: Diagnosis not present

## 2016-03-22 DIAGNOSIS — I4891 Unspecified atrial fibrillation: Secondary | ICD-10-CM | POA: Diagnosis not present

## 2016-03-22 DIAGNOSIS — Z471 Aftercare following joint replacement surgery: Secondary | ICD-10-CM | POA: Diagnosis not present

## 2016-03-22 DIAGNOSIS — Z96651 Presence of right artificial knee joint: Secondary | ICD-10-CM | POA: Diagnosis not present

## 2016-03-24 DIAGNOSIS — I10 Essential (primary) hypertension: Secondary | ICD-10-CM | POA: Diagnosis not present

## 2016-03-24 DIAGNOSIS — Z8673 Personal history of transient ischemic attack (TIA), and cerebral infarction without residual deficits: Secondary | ICD-10-CM | POA: Diagnosis not present

## 2016-03-24 DIAGNOSIS — I4891 Unspecified atrial fibrillation: Secondary | ICD-10-CM | POA: Diagnosis not present

## 2016-03-24 DIAGNOSIS — Z96651 Presence of right artificial knee joint: Secondary | ICD-10-CM | POA: Diagnosis not present

## 2016-03-24 DIAGNOSIS — Z471 Aftercare following joint replacement surgery: Secondary | ICD-10-CM | POA: Diagnosis not present

## 2016-03-26 DIAGNOSIS — Z96651 Presence of right artificial knee joint: Secondary | ICD-10-CM | POA: Diagnosis not present

## 2016-03-26 DIAGNOSIS — Z8673 Personal history of transient ischemic attack (TIA), and cerebral infarction without residual deficits: Secondary | ICD-10-CM | POA: Diagnosis not present

## 2016-03-26 DIAGNOSIS — I10 Essential (primary) hypertension: Secondary | ICD-10-CM | POA: Diagnosis not present

## 2016-03-26 DIAGNOSIS — I4891 Unspecified atrial fibrillation: Secondary | ICD-10-CM | POA: Diagnosis not present

## 2016-03-26 DIAGNOSIS — Z471 Aftercare following joint replacement surgery: Secondary | ICD-10-CM | POA: Diagnosis not present

## 2016-03-28 DIAGNOSIS — I10 Essential (primary) hypertension: Secondary | ICD-10-CM | POA: Diagnosis not present

## 2016-03-28 DIAGNOSIS — Z8673 Personal history of transient ischemic attack (TIA), and cerebral infarction without residual deficits: Secondary | ICD-10-CM | POA: Diagnosis not present

## 2016-03-28 DIAGNOSIS — Z471 Aftercare following joint replacement surgery: Secondary | ICD-10-CM | POA: Diagnosis not present

## 2016-03-28 DIAGNOSIS — Z96651 Presence of right artificial knee joint: Secondary | ICD-10-CM | POA: Diagnosis not present

## 2016-03-28 DIAGNOSIS — I4891 Unspecified atrial fibrillation: Secondary | ICD-10-CM | POA: Diagnosis not present

## 2016-03-31 DIAGNOSIS — Z96651 Presence of right artificial knee joint: Secondary | ICD-10-CM | POA: Diagnosis not present

## 2016-03-31 DIAGNOSIS — Z471 Aftercare following joint replacement surgery: Secondary | ICD-10-CM | POA: Diagnosis not present

## 2016-03-31 DIAGNOSIS — Z8673 Personal history of transient ischemic attack (TIA), and cerebral infarction without residual deficits: Secondary | ICD-10-CM | POA: Diagnosis not present

## 2016-03-31 DIAGNOSIS — I4891 Unspecified atrial fibrillation: Secondary | ICD-10-CM | POA: Diagnosis not present

## 2016-03-31 DIAGNOSIS — I10 Essential (primary) hypertension: Secondary | ICD-10-CM | POA: Diagnosis not present

## 2016-04-01 DIAGNOSIS — M25461 Effusion, right knee: Secondary | ICD-10-CM | POA: Diagnosis not present

## 2016-04-01 DIAGNOSIS — Z96651 Presence of right artificial knee joint: Secondary | ICD-10-CM | POA: Diagnosis not present

## 2016-04-02 DIAGNOSIS — R2689 Other abnormalities of gait and mobility: Secondary | ICD-10-CM | POA: Diagnosis not present

## 2016-04-02 DIAGNOSIS — Z96651 Presence of right artificial knee joint: Secondary | ICD-10-CM | POA: Diagnosis not present

## 2016-04-02 DIAGNOSIS — M25461 Effusion, right knee: Secondary | ICD-10-CM | POA: Diagnosis not present

## 2016-04-02 DIAGNOSIS — M25561 Pain in right knee: Secondary | ICD-10-CM | POA: Diagnosis not present

## 2016-04-04 DIAGNOSIS — M25461 Effusion, right knee: Secondary | ICD-10-CM | POA: Diagnosis not present

## 2016-04-04 DIAGNOSIS — Z96651 Presence of right artificial knee joint: Secondary | ICD-10-CM | POA: Diagnosis not present

## 2016-04-04 DIAGNOSIS — R2689 Other abnormalities of gait and mobility: Secondary | ICD-10-CM | POA: Diagnosis not present

## 2016-04-04 DIAGNOSIS — M25561 Pain in right knee: Secondary | ICD-10-CM | POA: Diagnosis not present

## 2016-04-07 DIAGNOSIS — M25461 Effusion, right knee: Secondary | ICD-10-CM | POA: Diagnosis not present

## 2016-04-07 DIAGNOSIS — R2689 Other abnormalities of gait and mobility: Secondary | ICD-10-CM | POA: Diagnosis not present

## 2016-04-07 DIAGNOSIS — M25561 Pain in right knee: Secondary | ICD-10-CM | POA: Diagnosis not present

## 2016-04-07 DIAGNOSIS — Z96651 Presence of right artificial knee joint: Secondary | ICD-10-CM | POA: Diagnosis not present

## 2016-04-09 DIAGNOSIS — M25461 Effusion, right knee: Secondary | ICD-10-CM | POA: Diagnosis not present

## 2016-04-09 DIAGNOSIS — Z96651 Presence of right artificial knee joint: Secondary | ICD-10-CM | POA: Diagnosis not present

## 2016-04-09 DIAGNOSIS — R2689 Other abnormalities of gait and mobility: Secondary | ICD-10-CM | POA: Diagnosis not present

## 2016-04-09 DIAGNOSIS — M25561 Pain in right knee: Secondary | ICD-10-CM | POA: Diagnosis not present

## 2016-04-11 DIAGNOSIS — M25561 Pain in right knee: Secondary | ICD-10-CM | POA: Diagnosis not present

## 2016-04-11 DIAGNOSIS — Z96651 Presence of right artificial knee joint: Secondary | ICD-10-CM | POA: Diagnosis not present

## 2016-04-11 DIAGNOSIS — R2689 Other abnormalities of gait and mobility: Secondary | ICD-10-CM | POA: Diagnosis not present

## 2016-04-11 DIAGNOSIS — M25461 Effusion, right knee: Secondary | ICD-10-CM | POA: Diagnosis not present

## 2016-04-15 DIAGNOSIS — Z96651 Presence of right artificial knee joint: Secondary | ICD-10-CM | POA: Diagnosis not present

## 2016-04-15 DIAGNOSIS — R2689 Other abnormalities of gait and mobility: Secondary | ICD-10-CM | POA: Diagnosis not present

## 2016-04-15 DIAGNOSIS — M25561 Pain in right knee: Secondary | ICD-10-CM | POA: Diagnosis not present

## 2016-04-15 DIAGNOSIS — M25461 Effusion, right knee: Secondary | ICD-10-CM | POA: Diagnosis not present

## 2016-04-16 DIAGNOSIS — M25461 Effusion, right knee: Secondary | ICD-10-CM | POA: Diagnosis not present

## 2016-04-16 DIAGNOSIS — Z96651 Presence of right artificial knee joint: Secondary | ICD-10-CM | POA: Diagnosis not present

## 2016-04-16 DIAGNOSIS — M25561 Pain in right knee: Secondary | ICD-10-CM | POA: Diagnosis not present

## 2016-04-16 DIAGNOSIS — R2689 Other abnormalities of gait and mobility: Secondary | ICD-10-CM | POA: Diagnosis not present

## 2016-04-18 DIAGNOSIS — M25461 Effusion, right knee: Secondary | ICD-10-CM | POA: Diagnosis not present

## 2016-04-18 DIAGNOSIS — R2689 Other abnormalities of gait and mobility: Secondary | ICD-10-CM | POA: Diagnosis not present

## 2016-04-18 DIAGNOSIS — Z96651 Presence of right artificial knee joint: Secondary | ICD-10-CM | POA: Diagnosis not present

## 2016-04-18 DIAGNOSIS — M25561 Pain in right knee: Secondary | ICD-10-CM | POA: Diagnosis not present

## 2016-04-22 DIAGNOSIS — Z96651 Presence of right artificial knee joint: Secondary | ICD-10-CM | POA: Diagnosis not present

## 2016-04-22 DIAGNOSIS — M25461 Effusion, right knee: Secondary | ICD-10-CM | POA: Diagnosis not present

## 2016-04-22 DIAGNOSIS — R2689 Other abnormalities of gait and mobility: Secondary | ICD-10-CM | POA: Diagnosis not present

## 2016-04-22 DIAGNOSIS — M25561 Pain in right knee: Secondary | ICD-10-CM | POA: Diagnosis not present

## 2016-04-24 DIAGNOSIS — M25461 Effusion, right knee: Secondary | ICD-10-CM | POA: Diagnosis not present

## 2016-04-24 DIAGNOSIS — R2689 Other abnormalities of gait and mobility: Secondary | ICD-10-CM | POA: Diagnosis not present

## 2016-04-24 DIAGNOSIS — M25561 Pain in right knee: Secondary | ICD-10-CM | POA: Diagnosis not present

## 2016-04-24 DIAGNOSIS — Z96651 Presence of right artificial knee joint: Secondary | ICD-10-CM | POA: Diagnosis not present

## 2016-04-29 DIAGNOSIS — M25561 Pain in right knee: Secondary | ICD-10-CM | POA: Diagnosis not present

## 2016-04-29 DIAGNOSIS — Z96651 Presence of right artificial knee joint: Secondary | ICD-10-CM | POA: Diagnosis not present

## 2016-04-29 DIAGNOSIS — M25461 Effusion, right knee: Secondary | ICD-10-CM | POA: Diagnosis not present

## 2016-04-29 DIAGNOSIS — R2689 Other abnormalities of gait and mobility: Secondary | ICD-10-CM | POA: Diagnosis not present

## 2016-05-01 DIAGNOSIS — Z96651 Presence of right artificial knee joint: Secondary | ICD-10-CM | POA: Diagnosis not present

## 2016-05-01 DIAGNOSIS — M25561 Pain in right knee: Secondary | ICD-10-CM | POA: Diagnosis not present

## 2016-05-01 DIAGNOSIS — M25461 Effusion, right knee: Secondary | ICD-10-CM | POA: Diagnosis not present

## 2016-05-01 DIAGNOSIS — R2689 Other abnormalities of gait and mobility: Secondary | ICD-10-CM | POA: Diagnosis not present

## 2016-05-06 DIAGNOSIS — M25461 Effusion, right knee: Secondary | ICD-10-CM | POA: Diagnosis not present

## 2016-05-06 DIAGNOSIS — R2689 Other abnormalities of gait and mobility: Secondary | ICD-10-CM | POA: Diagnosis not present

## 2016-05-06 DIAGNOSIS — Z96651 Presence of right artificial knee joint: Secondary | ICD-10-CM | POA: Diagnosis not present

## 2016-05-06 DIAGNOSIS — M25561 Pain in right knee: Secondary | ICD-10-CM | POA: Diagnosis not present

## 2016-05-08 DIAGNOSIS — R2689 Other abnormalities of gait and mobility: Secondary | ICD-10-CM | POA: Diagnosis not present

## 2016-05-08 DIAGNOSIS — Z96651 Presence of right artificial knee joint: Secondary | ICD-10-CM | POA: Diagnosis not present

## 2016-05-08 DIAGNOSIS — M25561 Pain in right knee: Secondary | ICD-10-CM | POA: Diagnosis not present

## 2016-05-08 DIAGNOSIS — M25461 Effusion, right knee: Secondary | ICD-10-CM | POA: Diagnosis not present

## 2016-06-19 DIAGNOSIS — R7301 Impaired fasting glucose: Secondary | ICD-10-CM | POA: Diagnosis not present

## 2016-06-19 DIAGNOSIS — E782 Mixed hyperlipidemia: Secondary | ICD-10-CM | POA: Diagnosis not present

## 2016-06-19 DIAGNOSIS — I10 Essential (primary) hypertension: Secondary | ICD-10-CM | POA: Diagnosis not present

## 2016-06-25 DIAGNOSIS — Z0000 Encounter for general adult medical examination without abnormal findings: Secondary | ICD-10-CM | POA: Diagnosis not present

## 2016-06-25 DIAGNOSIS — I4892 Unspecified atrial flutter: Secondary | ICD-10-CM | POA: Diagnosis not present

## 2016-06-25 DIAGNOSIS — I639 Cerebral infarction, unspecified: Secondary | ICD-10-CM | POA: Diagnosis not present

## 2016-06-25 DIAGNOSIS — G4733 Obstructive sleep apnea (adult) (pediatric): Secondary | ICD-10-CM | POA: Diagnosis not present

## 2016-06-25 DIAGNOSIS — Z8673 Personal history of transient ischemic attack (TIA), and cerebral infarction without residual deficits: Secondary | ICD-10-CM | POA: Diagnosis not present

## 2016-06-25 DIAGNOSIS — I10 Essential (primary) hypertension: Secondary | ICD-10-CM | POA: Diagnosis not present

## 2016-06-25 DIAGNOSIS — E782 Mixed hyperlipidemia: Secondary | ICD-10-CM | POA: Diagnosis not present

## 2016-06-25 DIAGNOSIS — Z1389 Encounter for screening for other disorder: Secondary | ICD-10-CM | POA: Diagnosis not present

## 2016-06-25 DIAGNOSIS — Z7901 Long term (current) use of anticoagulants: Secondary | ICD-10-CM | POA: Diagnosis not present

## 2016-06-26 ENCOUNTER — Encounter: Payer: Self-pay | Admitting: Cardiology

## 2016-06-26 DIAGNOSIS — Z7901 Long term (current) use of anticoagulants: Secondary | ICD-10-CM | POA: Diagnosis not present

## 2016-06-26 DIAGNOSIS — I10 Essential (primary) hypertension: Secondary | ICD-10-CM | POA: Diagnosis not present

## 2016-06-26 DIAGNOSIS — I480 Paroxysmal atrial fibrillation: Secondary | ICD-10-CM | POA: Diagnosis not present

## 2016-06-27 DIAGNOSIS — I480 Paroxysmal atrial fibrillation: Secondary | ICD-10-CM | POA: Diagnosis not present

## 2016-07-24 DIAGNOSIS — H43393 Other vitreous opacities, bilateral: Secondary | ICD-10-CM | POA: Diagnosis not present

## 2016-07-24 DIAGNOSIS — H5203 Hypermetropia, bilateral: Secondary | ICD-10-CM | POA: Diagnosis not present

## 2016-07-24 DIAGNOSIS — I10 Essential (primary) hypertension: Secondary | ICD-10-CM | POA: Diagnosis not present

## 2016-07-24 DIAGNOSIS — H25813 Combined forms of age-related cataract, bilateral: Secondary | ICD-10-CM | POA: Diagnosis not present

## 2016-07-24 DIAGNOSIS — H52223 Regular astigmatism, bilateral: Secondary | ICD-10-CM | POA: Diagnosis not present

## 2016-07-24 DIAGNOSIS — H524 Presbyopia: Secondary | ICD-10-CM | POA: Diagnosis not present

## 2016-07-24 DIAGNOSIS — H43813 Vitreous degeneration, bilateral: Secondary | ICD-10-CM | POA: Diagnosis not present

## 2016-09-09 DIAGNOSIS — Z471 Aftercare following joint replacement surgery: Secondary | ICD-10-CM | POA: Diagnosis not present

## 2016-09-09 DIAGNOSIS — Z96651 Presence of right artificial knee joint: Secondary | ICD-10-CM | POA: Diagnosis not present

## 2016-10-29 DIAGNOSIS — G4733 Obstructive sleep apnea (adult) (pediatric): Secondary | ICD-10-CM | POA: Diagnosis not present

## 2016-10-29 DIAGNOSIS — Z7901 Long term (current) use of anticoagulants: Secondary | ICD-10-CM | POA: Diagnosis not present

## 2016-10-29 DIAGNOSIS — I639 Cerebral infarction, unspecified: Secondary | ICD-10-CM | POA: Diagnosis not present

## 2017-01-29 DIAGNOSIS — Z1283 Encounter for screening for malignant neoplasm of skin: Secondary | ICD-10-CM | POA: Diagnosis not present

## 2017-01-29 DIAGNOSIS — L812 Freckles: Secondary | ICD-10-CM | POA: Diagnosis not present

## 2017-01-29 DIAGNOSIS — D485 Neoplasm of uncertain behavior of skin: Secondary | ICD-10-CM | POA: Diagnosis not present

## 2017-01-29 DIAGNOSIS — L578 Other skin changes due to chronic exposure to nonionizing radiation: Secondary | ICD-10-CM | POA: Diagnosis not present

## 2017-01-29 DIAGNOSIS — L821 Other seborrheic keratosis: Secondary | ICD-10-CM | POA: Diagnosis not present

## 2017-01-29 DIAGNOSIS — D2271 Melanocytic nevi of right lower limb, including hip: Secondary | ICD-10-CM | POA: Diagnosis not present

## 2017-01-29 DIAGNOSIS — D1800 Hemangioma unspecified site: Secondary | ICD-10-CM | POA: Diagnosis not present

## 2017-03-10 DIAGNOSIS — Z471 Aftercare following joint replacement surgery: Secondary | ICD-10-CM | POA: Diagnosis not present

## 2017-03-10 DIAGNOSIS — M25561 Pain in right knee: Secondary | ICD-10-CM | POA: Diagnosis not present

## 2017-03-10 DIAGNOSIS — Z96651 Presence of right artificial knee joint: Secondary | ICD-10-CM | POA: Diagnosis not present

## 2017-05-14 ENCOUNTER — Telehealth: Payer: Self-pay | Admitting: Cardiology

## 2017-05-14 NOTE — Telephone Encounter (Signed)
Closed encounter °

## 2017-05-19 ENCOUNTER — Encounter: Payer: Self-pay | Admitting: Cardiology

## 2017-05-27 DIAGNOSIS — I4891 Unspecified atrial fibrillation: Secondary | ICD-10-CM | POA: Diagnosis not present

## 2017-05-27 DIAGNOSIS — F3160 Bipolar disorder, current episode mixed, unspecified: Secondary | ICD-10-CM | POA: Diagnosis not present

## 2017-05-27 DIAGNOSIS — I4892 Unspecified atrial flutter: Secondary | ICD-10-CM | POA: Diagnosis not present

## 2017-05-27 DIAGNOSIS — Z125 Encounter for screening for malignant neoplasm of prostate: Secondary | ICD-10-CM | POA: Diagnosis not present

## 2017-05-27 DIAGNOSIS — E782 Mixed hyperlipidemia: Secondary | ICD-10-CM | POA: Diagnosis not present

## 2017-05-27 DIAGNOSIS — Z1389 Encounter for screening for other disorder: Secondary | ICD-10-CM | POA: Diagnosis not present

## 2017-05-27 DIAGNOSIS — R7303 Prediabetes: Secondary | ICD-10-CM | POA: Diagnosis not present

## 2017-05-27 DIAGNOSIS — Z139 Encounter for screening, unspecified: Secondary | ICD-10-CM | POA: Diagnosis not present

## 2017-05-27 DIAGNOSIS — I10 Essential (primary) hypertension: Secondary | ICD-10-CM | POA: Diagnosis not present

## 2017-06-03 DIAGNOSIS — Z139 Encounter for screening, unspecified: Secondary | ICD-10-CM | POA: Diagnosis not present

## 2017-06-03 DIAGNOSIS — E782 Mixed hyperlipidemia: Secondary | ICD-10-CM | POA: Diagnosis not present

## 2017-06-03 DIAGNOSIS — I10 Essential (primary) hypertension: Secondary | ICD-10-CM | POA: Diagnosis not present

## 2017-06-03 DIAGNOSIS — R58 Hemorrhage, not elsewhere classified: Secondary | ICD-10-CM | POA: Diagnosis not present

## 2017-06-09 ENCOUNTER — Ambulatory Visit: Payer: Medicare Other | Admitting: Cardiology

## 2017-06-24 ENCOUNTER — Ambulatory Visit (INDEPENDENT_AMBULATORY_CARE_PROVIDER_SITE_OTHER): Payer: Medicare Other | Admitting: Cardiology

## 2017-06-24 ENCOUNTER — Encounter: Payer: Self-pay | Admitting: Cardiology

## 2017-06-24 VITALS — BP 118/74 | HR 63 | Ht 68.0 in | Wt 177.0 lb

## 2017-06-24 DIAGNOSIS — I480 Paroxysmal atrial fibrillation: Secondary | ICD-10-CM

## 2017-06-24 DIAGNOSIS — Z7901 Long term (current) use of anticoagulants: Secondary | ICD-10-CM

## 2017-06-24 DIAGNOSIS — Q211 Atrial septal defect, unspecified: Secondary | ICD-10-CM

## 2017-06-24 DIAGNOSIS — I119 Hypertensive heart disease without heart failure: Secondary | ICD-10-CM | POA: Diagnosis not present

## 2017-06-24 DIAGNOSIS — Z024 Encounter for examination for driving license: Secondary | ICD-10-CM

## 2017-06-24 DIAGNOSIS — E785 Hyperlipidemia, unspecified: Secondary | ICD-10-CM | POA: Diagnosis not present

## 2017-06-24 NOTE — Progress Notes (Signed)
Cardiology Office Note:    Date:  06/24/2017   ID:  Mathew Leach, DOB 06/19/1951, MRN [id number]  PCP:  Mathew Shivers, MD  Cardiologist:  Mathew More, MD    Referring MD: Mathew Shivers, MD    ASSESSMENT:    1. PAF (paroxysmal atrial fibrillation) ([place])   2. Hypertensive heart disease without heart failure   3. Dyslipidemia   4. Chronic anticoagulation   5. ASD (atrial septal defect)   6. Encounter for CDL (commercial driving license) exam    PLAN:    In order of problems listed above:  1. Stable no clinical recurrence remains anticoagulated. At this time I would not consider an antiarrhythmic drug. His EKG today shows sinus rhythm 2. Stable blood pressure target continue current treatment calcium channel blocker and ACE inhibitor 3. Stable continue is intermediate intensity statin 4. Stable compliant continue his current anticoagulant I would not consider closure of PFO ASD unless he had clinical recurrence with anticoagulation 5. Treadmill EKG ordered by with CDL   Next appointment: 1 year   Medication Adjustments/Labs and Tests Ordered: Current medicines are reviewed at length with the patient today.  Concerns regarding medicines are outlined above.  No orders of the defined types were placed in this encounter.  No orders of the defined types were placed in this encounter.   Chief Complaint  Patient presents with  . Follow-up    1 year follow up   . Atrial Fibrillation  . Anticoagulation    History of Present Illness:    Mathew Leach is a 66 y.o. male with a hx of Paroxysmal  Atrial Fibrillation, chronic anticoagulation, Dyslipidemia, HTN, CVA  last seen one year ago. Compliance with diet, lifestyle and medications: Yes Past Medical History:  Diagnosis Date  . ASD (atrial septal defect) 06/10/2015  . Atrial flutter, paroxysmal ([place]) 08/01/2015  . Chronic anticoagulation 08/29/2015  . Chronic pain of right knee 12/07/2014  . Dysrhythmia    AFIB  .  Hypertension   . Obstructive sleep apnea syndrome 06/26/2015  . PAF (paroxysmal atrial fibrillation) ([place]) 07/30/2015  . Pericarditis    1999  . Preoperative cardiovascular examination 02/03/2016  . 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 Shortness of breath dyspnea    W/ EXERTION   . Sleep apnea    CPAP  . Stroke [facility])    04-2015    Past Surgical History:  Procedure Laterality Date  . CYST REMOVAL NECK     AGE 30  . INGUINAL HERNIA REPAIR     LEFT AS CHILD  . TOTAL KNEE ARTHROPLASTY Right 03/17/2016   Procedure: TOTAL KNEE ARTHROPLASTY;  Surgeon: [name], MD;  Location: [place];  Service: Orthopedics;  Laterality: Right;    Current Medications: Current Meds  Medication Sig  . amLODipine-benazepril (LOTREL) 10-20 MG capsule Take 1 capsule by mouth daily.  Marland Kitchen ELIQUIS 5 MG TABS tablet Take 1 tablet by mouth 2 (two) times daily.  . Folic Acid-Vit G2-BWL S93 0.8-50-0.1 MG TABS Take 1 tablet by mouth daily.  . Omega-3 Fatty Acids (FISH OIL) 1000 MG CAPS Take 1 capsule by mouth daily.  . simvastatin (ZOCOR) 20 MG tablet Take 20 mg by mouth daily.     Allergies:   Lipitor [atorvastatin]   Social History   Social History  . Marital status: Married    Spouse name: N/A  . Number of children: N/A  . Years of education: N/A   Social History Main Topics  .  Smoking status: Never Smoker  . Smokeless tobacco: Never Used  . Alcohol use No  . Drug use: No  . Sexual activity: Not Asked   Other Topics Concern  . None   Social History Narrative  . None     Family History: The patient's family history includes Cancer in his father. ROS:   Please see the history of present illness.    All other systems reviewed and are negative.  EKGs/Labs/Other Studies Reviewed:    The following studies were reviewed today:  EKG:  EKG ordered today.  The ekg ordered today demonstrates [place] normal.  Recent Labs:Requested from his PCP No results found for requested  labs within last 8760 hours.  Recent Lipid Panel No results found for: CHOL, TRIG, HDL, CHOLHDL, VLDL, LDLCALC, LDLDIRECT  Physical Exam:    VS:  BP 118/74 (BP Location: Right Arm, Patient Position: Sitting)   Pulse 63   Ht 5\' 8"  (1.727 m)   Wt 177 lb (80.3 kg)   SpO2 97%   BMI 26.91 kg/m     Wt Readings from Last 3 Encounters:  06/24/17 177 lb (80.3 kg)  03/17/16 181 lb (82.1 kg)  03/06/16 181 lb 3.2 oz (82.2 kg)     GEN:  Well nourished, well developed in no acute distress HEENT: Normal NECK: No JVD; No carotid bruits LYMPHATICS: No lymphadenopathy CARDIAC: RRR, no murmurs, rubs, gallops RESPIRATORY:  Clear to auscultation without rales, wheezing or rhonchi  ABDOMEN: Soft, non-tender, non-distended MUSCULOSKELETAL:  No edema; No deformity  SKIN: Warm and dry NEUROLOGIC:  Alert and oriented x 3 PSYCHIATRIC:  Normal affect    Signed, Mathew More, MD  06/24/2017 11:10 AM    [place]

## 2017-06-24 NOTE — Patient Instructions (Signed)
Medication Instructions:  Your physician recommends that you continue on your current medications as directed. Please refer to the Current Medication list given to you today.   Labwork: None  Testing/Procedures: Your physician has requested that you have an exercise tolerance test. For further information please visit [email or web address]. Please also follow instruction sheet, as given.  You had an EKG today.  Follow-Up: Your physician wants you to follow-up in: 1 year. You will receive a reminder letter in the mail two months in advance. If you don't receive a letter, please call our office to schedule the follow-up appointment.   Any Other Special Instructions Will Be Listed Below (If Applicable).     If you need a refill on your cardiac medications before your next appointment, please call your pharmacy.

## 2017-06-25 ENCOUNTER — Encounter: Payer: Self-pay | Admitting: Cardiology

## 2017-06-25 DIAGNOSIS — I119 Hypertensive heart disease without heart failure: Secondary | ICD-10-CM | POA: Diagnosis not present

## 2017-09-17 ENCOUNTER — Other Ambulatory Visit: Payer: Self-pay | Admitting: Cardiology

## 2017-11-13 DIAGNOSIS — I10 Essential (primary) hypertension: Secondary | ICD-10-CM | POA: Diagnosis not present

## 2017-11-13 DIAGNOSIS — H5203 Hypermetropia, bilateral: Secondary | ICD-10-CM | POA: Diagnosis not present

## 2017-11-13 DIAGNOSIS — H43811 Vitreous degeneration, right eye: Secondary | ICD-10-CM | POA: Diagnosis not present

## 2017-11-13 DIAGNOSIS — H43311 Vitreous membranes and strands, right eye: Secondary | ICD-10-CM | POA: Diagnosis not present

## 2017-11-13 DIAGNOSIS — H524 Presbyopia: Secondary | ICD-10-CM | POA: Diagnosis not present

## 2017-11-13 DIAGNOSIS — H52223 Regular astigmatism, bilateral: Secondary | ICD-10-CM | POA: Diagnosis not present

## 2017-11-13 DIAGNOSIS — H25813 Combined forms of age-related cataract, bilateral: Secondary | ICD-10-CM | POA: Diagnosis not present

## 2017-12-01 DIAGNOSIS — E782 Mixed hyperlipidemia: Secondary | ICD-10-CM | POA: Diagnosis not present

## 2017-12-01 DIAGNOSIS — R7303 Prediabetes: Secondary | ICD-10-CM | POA: Diagnosis not present

## 2017-12-01 DIAGNOSIS — I10 Essential (primary) hypertension: Secondary | ICD-10-CM | POA: Diagnosis not present

## 2017-12-07 DIAGNOSIS — I10 Essential (primary) hypertension: Secondary | ICD-10-CM | POA: Diagnosis not present

## 2017-12-07 DIAGNOSIS — Z1331 Encounter for screening for depression: Secondary | ICD-10-CM | POA: Diagnosis not present

## 2017-12-07 DIAGNOSIS — Z139 Encounter for screening, unspecified: Secondary | ICD-10-CM | POA: Diagnosis not present

## 2017-12-07 DIAGNOSIS — Z9181 History of falling: Secondary | ICD-10-CM | POA: Diagnosis not present

## 2017-12-07 DIAGNOSIS — I4891 Unspecified atrial fibrillation: Secondary | ICD-10-CM | POA: Diagnosis not present

## 2017-12-07 DIAGNOSIS — E782 Mixed hyperlipidemia: Secondary | ICD-10-CM | POA: Diagnosis not present

## 2017-12-07 DIAGNOSIS — R7303 Prediabetes: Secondary | ICD-10-CM | POA: Diagnosis not present

## 2017-12-07 DIAGNOSIS — Z0000 Encounter for general adult medical examination without abnormal findings: Secondary | ICD-10-CM | POA: Diagnosis not present

## 2018-02-01 DIAGNOSIS — L821 Other seborrheic keratosis: Secondary | ICD-10-CM | POA: Diagnosis not present

## 2018-02-01 DIAGNOSIS — D225 Melanocytic nevi of trunk: Secondary | ICD-10-CM | POA: Diagnosis not present

## 2018-02-01 DIAGNOSIS — D2260 Melanocytic nevi of unspecified upper limb, including shoulder: Secondary | ICD-10-CM | POA: Diagnosis not present

## 2018-02-01 DIAGNOSIS — D485 Neoplasm of uncertain behavior of skin: Secondary | ICD-10-CM | POA: Diagnosis not present

## 2018-02-01 DIAGNOSIS — L812 Freckles: Secondary | ICD-10-CM | POA: Diagnosis not present

## 2018-02-01 DIAGNOSIS — Z1283 Encounter for screening for malignant neoplasm of skin: Secondary | ICD-10-CM | POA: Diagnosis not present

## 2018-02-01 DIAGNOSIS — D1800 Hemangioma unspecified site: Secondary | ICD-10-CM | POA: Diagnosis not present

## 2018-02-01 DIAGNOSIS — B351 Tinea unguium: Secondary | ICD-10-CM | POA: Diagnosis not present

## 2018-02-01 DIAGNOSIS — L578 Other skin changes due to chronic exposure to nonionizing radiation: Secondary | ICD-10-CM | POA: Diagnosis not present

## 2018-05-03 DIAGNOSIS — I10 Essential (primary) hypertension: Secondary | ICD-10-CM | POA: Diagnosis not present

## 2018-05-03 DIAGNOSIS — E782 Mixed hyperlipidemia: Secondary | ICD-10-CM | POA: Diagnosis not present

## 2018-05-03 DIAGNOSIS — R7303 Prediabetes: Secondary | ICD-10-CM | POA: Diagnosis not present

## 2018-05-10 DIAGNOSIS — Z9181 History of falling: Secondary | ICD-10-CM | POA: Diagnosis not present

## 2018-05-10 DIAGNOSIS — E782 Mixed hyperlipidemia: Secondary | ICD-10-CM | POA: Diagnosis not present

## 2018-05-10 DIAGNOSIS — I10 Essential (primary) hypertension: Secondary | ICD-10-CM | POA: Diagnosis not present

## 2018-05-10 DIAGNOSIS — R7303 Prediabetes: Secondary | ICD-10-CM | POA: Diagnosis not present

## 2018-06-16 DIAGNOSIS — Q2112 Patent foramen ovale: Secondary | ICD-10-CM | POA: Insufficient documentation

## 2018-06-16 DIAGNOSIS — Q211 Atrial septal defect: Secondary | ICD-10-CM | POA: Insufficient documentation

## 2018-06-16 HISTORY — DX: Atrial septal defect: Q21.1

## 2018-06-16 HISTORY — DX: Patent foramen ovale: Q21.12

## 2018-06-16 NOTE — Progress Notes (Signed)
Cardiology Office Note:    Date:  06/17/2018   ID:  Mathew Leach, DOB 03/09/1951, MRN [id number]  PCP:  Maryella Shivers, MD  Cardiologist:  Shirlee More, MD    Referring MD: Maryella Shivers, MD    ASSESSMENT:    1. PAF (paroxysmal atrial fibrillation) ([place])   2. Chronic anticoagulation   3. PFO (patent foramen ovale)   4. Atrial flutter, paroxysmal ([place])   5. Hypertensive heart disease without heart failure   6. Dyslipidemia    PLAN:    In order of problems listed above:  1. Stable no clinical recurrence of asked him to screen heart rate and blood pressure twice a week at this time I would not place him on antiarrhythmic drug especially with stroke PFO and atrial fibrillation will remain anticoagulated.  I will send a copy of the note to Dr. Burt Knack from our group and ask for his opinion at this time I would not recommend closure. 2. Stable no bleeding complications continue his anticoagulant and recent labs requested by his PCP I like to see him have a CBC yearly 3. Stable asymptomatic no recurrent event and anticoagulant 4. Stable asymptomatic 5. Stable blood pressure target continue current treatment 6. Stable continue statin await labs for renal function potassium liver function as well as lipid profile for safety and efficacy   Next appointment: One year   Medication Adjustments/Labs and Tests Ordered: Current medicines are reviewed at length with the patient today.  Concerns regarding medicines are outlined above.  No orders of the defined types were placed in this encounter.  No orders of the defined types were placed in this encounter.   Chief Complaint  Patient presents with  . Atrial Fibrillation    woith PFO and stroke  . Anticoagulation  . Hypertension  . Hyperlipidemia    History of Present Illness:    Mathew Leach is a 67 y.o. 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 Compliance with diet, lifestyle and medications: Yes  Is pleased with the quality of his life he has no exercise intolerance dyspnea chest pain shortness of breath syncope or recurrent neurologic event.  He is compliant with his medication including his anticoagulant has had no bleeding complication.  He relates recent labs with his PCP copy requested Past Medical History:  Diagnosis Date  . ASD (atrial septal defect) 06/10/2015  . Atrial flutter, paroxysmal ([place]) 08/01/2015  . Chronic anticoagulation 08/29/2015  . Chronic pain of right knee 12/07/2014  . Dysrhythmia    AFIB  . Hypertension   . Obstructive sleep apnea syndrome 06/26/2015  . PAF (paroxysmal atrial fibrillation) ([place]) 07/30/2015  . Pericarditis    1999  . Preoperative cardiovascular examination 02/03/2016  . 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 Shortness of breath dyspnea    W/ EXERTION   . Sleep apnea    CPAP  . Stroke [facility])    04-2015    Past Surgical History:  Procedure Laterality Date  . CYST REMOVAL NECK     AGE 32  . INGUINAL HERNIA REPAIR     LEFT AS CHILD  . TOTAL KNEE ARTHROPLASTY Right 03/17/2016   Procedure: TOTAL KNEE ARTHROPLASTY;  Surgeon: [name], MD;  Location: [place];  Service: Orthopedics;  Laterality: Right;    Current Medications: No outpatient medications have been marked as taking for the 06/17/18 encounter (Office Visit)  with Mathew Priest, MD.     Allergies:   Lipitor [atorvastatin]   Social History   Socioeconomic History  . Marital status: Married    Spouse name: Not on file  . Number of children: Not on file  . Years of education: Not on file  . Highest education level: Not on file  Occupational History  . Not on file  Social Needs  . Financial resource strain: Not on file  . Food insecurity:    Worry: Not on file    Inability: Not on file  . Transportation needs:    Medical: Not on file    Non-medical: Not on file    Tobacco Use  . Smoking status: Never Smoker  . Smokeless tobacco: Never Used  Substance and Sexual Activity  . Alcohol use: No  . Drug use: No  . Sexual activity: Not on file  Lifestyle  . Physical activity:    Days per week: Not on file    Minutes per session: Not on file  . Stress: Not on file  Relationships  . Social connections:    Talks on phone: Not on file    Gets together: Not on file    Attends religious service: Not on file    Active member of club or organization: Not on file    Attends meetings of clubs or organizations: Not on file    Relationship status: Not on file  Other Topics Concern  . Not on file  Social History Narrative  . Not on file     Family History: The patient's family history includes Cancer in his father. ROS:   Please see the history of present illness.    All other systems reviewed and are negative.  EKGs/Labs/Other Studies Reviewed:    The following studies were reviewed today:  EKG:  EKG ordered today.  The ekg ordered today demonstrates [place]: No results found for requested labs within last 8760 hours.  Recent Lipid Panel No results found for: CHOL, TRIG, HDL, CHOLHDL, VLDL, LDLCALC, LDLDIRECT  Physical Exam:    VS:  There were no vitals taken for this visit.    Wt Readings from Last 3 Encounters:  06/24/17 177 lb (80.3 kg)  03/17/16 181 lb (82.1 kg)  03/06/16 181 lb 3.2 oz (82.2 kg)     GEN:  Well nourished, well developed in no acute distress HEENT: Normal NECK: No JVD; No carotid bruits LYMPHATICS: No lymphadenopathy CARDIAC: RRR, no murmurs, rubs, gallops RESPIRATORY:  Clear to auscultation without rales, wheezing or rhonchi  ABDOMEN: Soft, non-tender, non-distended MUSCULOSKELETAL:  No edema; No deformity  SKIN: Warm and dry NEUROLOGIC:  Alert and oriented x 3 PSYCHIATRIC:  Normal affect    Signed, Shirlee More, MD  06/17/2018 8:34 AM    [place]

## 2018-06-17 ENCOUNTER — Encounter: Payer: Self-pay | Admitting: Cardiology

## 2018-06-17 ENCOUNTER — Ambulatory Visit (INDEPENDENT_AMBULATORY_CARE_PROVIDER_SITE_OTHER): Payer: Medicare Other | Admitting: Cardiology

## 2018-06-17 VITALS — BP 110/60 | HR 72 | Ht 68.0 in | Wt 169.0 lb

## 2018-06-17 DIAGNOSIS — Z139 Encounter for screening, unspecified: Secondary | ICD-10-CM | POA: Diagnosis not present

## 2018-06-17 DIAGNOSIS — E785 Hyperlipidemia, unspecified: Secondary | ICD-10-CM

## 2018-06-17 DIAGNOSIS — Q2112 Patent foramen ovale: Secondary | ICD-10-CM

## 2018-06-17 DIAGNOSIS — Z7901 Long term (current) use of anticoagulants: Secondary | ICD-10-CM | POA: Diagnosis not present

## 2018-06-17 DIAGNOSIS — Z6825 Body mass index (BMI) 25.0-25.9, adult: Secondary | ICD-10-CM | POA: Diagnosis not present

## 2018-06-17 DIAGNOSIS — I119 Hypertensive heart disease without heart failure: Secondary | ICD-10-CM

## 2018-06-17 DIAGNOSIS — Z1331 Encounter for screening for depression: Secondary | ICD-10-CM | POA: Diagnosis not present

## 2018-06-17 DIAGNOSIS — Q211 Atrial septal defect: Secondary | ICD-10-CM

## 2018-06-17 DIAGNOSIS — G4733 Obstructive sleep apnea (adult) (pediatric): Secondary | ICD-10-CM | POA: Diagnosis not present

## 2018-06-17 DIAGNOSIS — I480 Paroxysmal atrial fibrillation: Secondary | ICD-10-CM

## 2018-06-17 DIAGNOSIS — I4892 Unspecified atrial flutter: Secondary | ICD-10-CM

## 2018-06-17 NOTE — Patient Instructions (Addendum)
Medication Instructions:  Your physician recommends that you continue on your current medications as directed. Please refer to the Current Medication list given to you today.  Labwork: None  Testing/Procedures: None  Follow-Up: Your physician recommends that you schedule a follow-up appointment in: 1 year  Any Other Special Instructions Will Be Listed Below (If Applicable).   check your HR and BP twice a week and record per Dr. Bettina Gavia  If you need a refill on your cardiac medications before your next appointment, please call your pharmacy.   [place], RN, BSN

## 2018-06-24 ENCOUNTER — Other Ambulatory Visit: Payer: Self-pay | Admitting: *Deleted

## 2018-06-24 DIAGNOSIS — Q211 Atrial septal defect, unspecified: Secondary | ICD-10-CM

## 2018-06-24 DIAGNOSIS — Z024 Encounter for examination for driving license: Secondary | ICD-10-CM

## 2018-06-24 DIAGNOSIS — E785 Hyperlipidemia, unspecified: Secondary | ICD-10-CM

## 2018-06-24 DIAGNOSIS — I480 Paroxysmal atrial fibrillation: Secondary | ICD-10-CM

## 2018-06-24 DIAGNOSIS — Z7901 Long term (current) use of anticoagulants: Secondary | ICD-10-CM

## 2018-06-24 DIAGNOSIS — I119 Hypertensive heart disease without heart failure: Secondary | ICD-10-CM

## 2018-08-09 DIAGNOSIS — G4733 Obstructive sleep apnea (adult) (pediatric): Secondary | ICD-10-CM | POA: Diagnosis not present

## 2018-08-18 ENCOUNTER — Other Ambulatory Visit: Payer: Self-pay | Admitting: Cardiology

## 2018-08-22 DIAGNOSIS — G4761 Periodic limb movement disorder: Secondary | ICD-10-CM | POA: Diagnosis not present

## 2018-08-22 DIAGNOSIS — G4733 Obstructive sleep apnea (adult) (pediatric): Secondary | ICD-10-CM | POA: Diagnosis not present

## 2018-10-04 ENCOUNTER — Other Ambulatory Visit: Payer: Self-pay

## 2018-10-08 DIAGNOSIS — G4733 Obstructive sleep apnea (adult) (pediatric): Secondary | ICD-10-CM | POA: Diagnosis not present

## 2018-10-25 DIAGNOSIS — E782 Mixed hyperlipidemia: Secondary | ICD-10-CM | POA: Diagnosis not present

## 2018-10-25 DIAGNOSIS — R7303 Prediabetes: Secondary | ICD-10-CM | POA: Diagnosis not present

## 2018-10-25 DIAGNOSIS — I10 Essential (primary) hypertension: Secondary | ICD-10-CM | POA: Diagnosis not present

## 2018-11-01 DIAGNOSIS — I10 Essential (primary) hypertension: Secondary | ICD-10-CM | POA: Diagnosis not present

## 2018-11-01 DIAGNOSIS — R7303 Prediabetes: Secondary | ICD-10-CM | POA: Diagnosis not present

## 2018-11-01 DIAGNOSIS — I4891 Unspecified atrial fibrillation: Secondary | ICD-10-CM | POA: Diagnosis not present

## 2018-11-01 DIAGNOSIS — E782 Mixed hyperlipidemia: Secondary | ICD-10-CM | POA: Diagnosis not present

## 2018-12-30 DIAGNOSIS — G4733 Obstructive sleep apnea (adult) (pediatric): Secondary | ICD-10-CM | POA: Diagnosis not present

## 2019-01-25 DIAGNOSIS — H524 Presbyopia: Secondary | ICD-10-CM | POA: Diagnosis not present

## 2019-01-25 DIAGNOSIS — H5203 Hypermetropia, bilateral: Secondary | ICD-10-CM | POA: Diagnosis not present

## 2019-01-25 DIAGNOSIS — H52223 Regular astigmatism, bilateral: Secondary | ICD-10-CM | POA: Diagnosis not present

## 2019-01-25 DIAGNOSIS — H25813 Combined forms of age-related cataract, bilateral: Secondary | ICD-10-CM | POA: Diagnosis not present

## 2019-01-25 DIAGNOSIS — I10 Essential (primary) hypertension: Secondary | ICD-10-CM | POA: Diagnosis not present

## 2019-01-25 DIAGNOSIS — H43811 Vitreous degeneration, right eye: Secondary | ICD-10-CM | POA: Diagnosis not present

## 2019-01-25 DIAGNOSIS — H43311 Vitreous membranes and strands, right eye: Secondary | ICD-10-CM | POA: Diagnosis not present

## 2019-02-15 DIAGNOSIS — I4891 Unspecified atrial fibrillation: Secondary | ICD-10-CM | POA: Diagnosis not present

## 2019-02-15 DIAGNOSIS — E782 Mixed hyperlipidemia: Secondary | ICD-10-CM | POA: Diagnosis not present

## 2019-02-15 DIAGNOSIS — R7303 Prediabetes: Secondary | ICD-10-CM | POA: Diagnosis not present

## 2019-02-15 DIAGNOSIS — I10 Essential (primary) hypertension: Secondary | ICD-10-CM | POA: Diagnosis not present

## 2019-03-17 DIAGNOSIS — E782 Mixed hyperlipidemia: Secondary | ICD-10-CM | POA: Diagnosis not present

## 2019-03-17 DIAGNOSIS — R7303 Prediabetes: Secondary | ICD-10-CM | POA: Diagnosis not present

## 2019-03-17 DIAGNOSIS — G4733 Obstructive sleep apnea (adult) (pediatric): Secondary | ICD-10-CM | POA: Diagnosis not present

## 2019-03-17 DIAGNOSIS — I10 Essential (primary) hypertension: Secondary | ICD-10-CM | POA: Diagnosis not present

## 2019-04-15 DIAGNOSIS — I10 Essential (primary) hypertension: Secondary | ICD-10-CM | POA: Diagnosis not present

## 2019-04-15 DIAGNOSIS — G4733 Obstructive sleep apnea (adult) (pediatric): Secondary | ICD-10-CM | POA: Diagnosis not present

## 2019-04-15 DIAGNOSIS — E782 Mixed hyperlipidemia: Secondary | ICD-10-CM | POA: Diagnosis not present

## 2019-04-15 DIAGNOSIS — R7303 Prediabetes: Secondary | ICD-10-CM | POA: Diagnosis not present

## 2019-04-25 DIAGNOSIS — I10 Essential (primary) hypertension: Secondary | ICD-10-CM | POA: Diagnosis not present

## 2019-04-25 DIAGNOSIS — R7303 Prediabetes: Secondary | ICD-10-CM | POA: Diagnosis not present

## 2019-04-25 DIAGNOSIS — E782 Mixed hyperlipidemia: Secondary | ICD-10-CM | POA: Diagnosis not present

## 2019-05-02 DIAGNOSIS — Z0000 Encounter for general adult medical examination without abnormal findings: Secondary | ICD-10-CM | POA: Diagnosis not present

## 2019-05-02 DIAGNOSIS — I10 Essential (primary) hypertension: Secondary | ICD-10-CM | POA: Diagnosis not present

## 2019-05-02 DIAGNOSIS — E782 Mixed hyperlipidemia: Secondary | ICD-10-CM | POA: Diagnosis not present

## 2019-05-02 DIAGNOSIS — I4892 Unspecified atrial flutter: Secondary | ICD-10-CM | POA: Diagnosis not present

## 2019-05-02 DIAGNOSIS — R7303 Prediabetes: Secondary | ICD-10-CM | POA: Diagnosis not present

## 2019-05-12 DIAGNOSIS — Z9989 Dependence on other enabling machines and devices: Secondary | ICD-10-CM | POA: Diagnosis not present

## 2019-05-12 DIAGNOSIS — G4733 Obstructive sleep apnea (adult) (pediatric): Secondary | ICD-10-CM | POA: Diagnosis not present

## 2019-05-17 DIAGNOSIS — E782 Mixed hyperlipidemia: Secondary | ICD-10-CM | POA: Diagnosis not present

## 2019-05-17 DIAGNOSIS — I4892 Unspecified atrial flutter: Secondary | ICD-10-CM | POA: Diagnosis not present

## 2019-05-17 DIAGNOSIS — R7303 Prediabetes: Secondary | ICD-10-CM | POA: Diagnosis not present

## 2019-05-17 DIAGNOSIS — I10 Essential (primary) hypertension: Secondary | ICD-10-CM | POA: Diagnosis not present

## 2019-06-12 NOTE — Progress Notes (Signed)
Cardiology Office Note:    Date:  06/13/2019   ID:  [name], DOB Jan 24, 1951, MRN [id number]  PCP:  Maryella Shivers, MD  Cardiologist:  Shirlee More, MD    Referring MD: Maryella Shivers, MD    ASSESSMENT:    1. PAF (paroxysmal atrial fibrillation) ([place])   2. Hypertensive heart disease without heart failure   3. Chronic anticoagulation   4. PFO (patent foramen ovale)   5. Dyslipidemia    PLAN:    In order of problems listed above:  1. Stable no clinical recurrence continue anticoagulation with previous stroke and patent foramen ovale. 2. Stable hypertension continue current treatment BP at target 3. Continue his anticoagulant he has had no bleeding complication 4. Stable continue anticoagulation with documented atrial fibrillation 5. 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  Concerns regarding medicines are outlined above.  No orders of the defined types were placed in this encounter.  No orders of the defined types were placed in this encounter.   Chief Complaint  Patient presents with  . Follow-up    with PFO   . Atrial Fibrillation  . Anticoagulation    History of Present Illness:    Mathew Leach is a 68 y.o. 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  He said no edema shortness of breath or chest pain.  He request a copy of his EKG my office note for pending DOT exam. Past Medical History:  Diagnosis Date  . ASD  (atrial septal defect) 06/10/2015  . Atrial flutter, paroxysmal ([place]) 08/01/2015  . Chronic anticoagulation 08/29/2015  . Chronic pain of right knee 12/07/2014  . Dysrhythmia    AFIB  . Hypertension   . Obstructive sleep apnea syndrome 06/26/2015  . PAF (paroxysmal atrial fibrillation) ([place]) 07/30/2015  . Pericarditis    1999  . Preoperative cardiovascular examination 02/03/2016  . 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 Shortness of breath dyspnea    W/ EXERTION   . Sleep apnea    CPAP  . Stroke [facility])    04-2015    Past Surgical History:  Procedure Laterality Date  . CYST REMOVAL NECK     AGE 89  . INGUINAL HERNIA REPAIR     LEFT AS CHILD  . TOTAL KNEE ARTHROPLASTY Right 03/17/2016   Procedure: TOTAL KNEE ARTHROPLASTY;  Surgeon: [name], MD;  Location: [place];  Service: Orthopedics;  Laterality: Right;    Current Medications: Current Meds  Medication Sig  . amLODipine-benazepril (LOTREL) 10-20 MG capsule Take 1 capsule by mouth daily.  Marland Kitchen ELIQUIS 5 MG TABS tablet TAKE 1 TABLET TWICE A DAY (DISCONTINUE ASPIRIN)  . folic acid (FOLVITE) 1 MG tablet Take 1 mg by mouth daily.   . Omega-3 Fatty Acids (FISH OIL) 1000 MG CAPS Take 1 capsule by mouth daily.  . simvastatin (ZOCOR) 20 MG tablet Take 20 mg by mouth daily.     Allergies:   Lipitor [atorvastatin]  Social History   Socioeconomic History  . Marital status: Married    Spouse name: Not on file  . Number of children: Not on file  . Years of education: Not on file  . Highest education level: Not on file  Occupational History  . Not on file  Social Needs  . Financial resource strain: Not on file  . Food insecurity    Worry: Not on file    Inability: Not on file  . Transportation needs    Medical: Not on file    Non-medical: Not on file  Tobacco Use  . Smoking status: Never Smoker  . Smokeless tobacco: Never Used  Substance and Sexual Activity  . Alcohol use: No  . Drug use: No  . Sexual  activity: Not on file  Lifestyle  . Physical activity    Days per week: Not on file    Minutes per session: Not on file  . Stress: Not on file  Relationships  . Social Herbalist on phone: Not on file    Gets together: Not on file    Attends religious service: Not on file    Active member of club or organization: Not on file    Attends meetings of clubs or organizations: Not on file    Relationship status: Not on file  Other Topics Concern  . Not on file  Social History Narrative  . Not on file     Family History: The patient's family history includes Cancer in his father. ROS:   Please see the history of present illness.    All other systems reviewed and are negative.  EKGs/Labs/Other Studies Reviewed:    The following studies were reviewed today:  EKG:  EKG ordered today and personally reviewed.  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  Recent Labs: 04/25/2019: Cholesterol 150 HDL 46 triglycerides 50 A1c 6.0% hemoglobin 13.0 creatinine 1.22 potassium 4.3  Physical Exam:    VS:  BP 124/74 (BP Location: Right Arm, Patient Position: Sitting, Cuff Size: Normal)   Pulse (!) 59   Temp 98.1 F (36.7 C)   Ht 5\' 8"  (1.727 m)   Wt 178 lb (80.7 kg)   SpO2 97%   BMI 27.06 kg/m     Wt Readings from Last 3 Encounters:  06/13/19 178 lb (80.7 kg)  06/17/18 169 lb (76.7 kg)  06/24/17 177 lb (80.3 kg)     GEN:  Well nourished, well developed in no acute distress HEENT: Normal NECK: No JVD; No carotid bruits LYMPHATICS: No lymphadenopathy CARDIAC: RRR, no murmurs, rubs, gallops RESPIRATORY:  Clear to auscultation without rales, wheezing or rhonchi  ABDOMEN: Soft, non-tender, non-distended MUSCULOSKELETAL:  No edema; No deformity  SKIN: Warm and dry NEUROLOGIC:  Alert and oriented x 3 PSYCHIATRIC:  Normal affect    Signed, Shirlee More, MD  06/13/2019 9:20 AM    [place]

## 2019-06-13 ENCOUNTER — Ambulatory Visit (INDEPENDENT_AMBULATORY_CARE_PROVIDER_SITE_OTHER): Payer: Medicare Other | Admitting: Cardiology

## 2019-06-13 ENCOUNTER — Other Ambulatory Visit: Payer: Self-pay

## 2019-06-13 ENCOUNTER — Encounter: Payer: Self-pay | Admitting: Cardiology

## 2019-06-13 VITALS — BP 124/74 | HR 59 | Temp 98.1°F | Ht 68.0 in | Wt 178.0 lb

## 2019-06-13 DIAGNOSIS — Z7901 Long term (current) use of anticoagulants: Secondary | ICD-10-CM | POA: Diagnosis not present

## 2019-06-13 DIAGNOSIS — Q211 Atrial septal defect: Secondary | ICD-10-CM | POA: Diagnosis not present

## 2019-06-13 DIAGNOSIS — I480 Paroxysmal atrial fibrillation: Secondary | ICD-10-CM | POA: Diagnosis not present

## 2019-06-13 DIAGNOSIS — E785 Hyperlipidemia, unspecified: Secondary | ICD-10-CM | POA: Diagnosis not present

## 2019-06-13 DIAGNOSIS — I119 Hypertensive heart disease without heart failure: Secondary | ICD-10-CM | POA: Diagnosis not present

## 2019-06-13 DIAGNOSIS — Q2112 Patent foramen ovale: Secondary | ICD-10-CM

## 2019-06-13 NOTE — Patient Instructions (Signed)
Medication Instructions:  Your physician recommends that you continue on your current medications as directed. Please refer to the Current Medication list given to you today.  If you need a refill on your cardiac medications before your next appointment, please call your pharmacy.   Lab work: None  If you have labs (blood work) drawn today and your tests are completely normal, you will receive your results only by: Marland Kitchen MyChart Message (if you have MyChart) OR . A paper copy in the mail If you have any lab test that is abnormal or we need to change your treatment, we will call you to review the results.  Testing/Procedures: You had an EKG today.   Follow-Up: At [facility], you and your health needs are our priority.  As part of our continuing mission to provide you with exceptional heart care, we have created designated Provider Care Teams.  These Care Teams include your primary Cardiologist (physician) and Advanced Practice Providers (APPs -  Physician Assistants and Nurse Practitioners) who all work together to provide you with the care you need, when you need it. You will need a follow up appointment in 1 years.  Please call our office 2 months in advance to schedule this appointment.

## 2019-06-17 DIAGNOSIS — I4892 Unspecified atrial flutter: Secondary | ICD-10-CM | POA: Diagnosis not present

## 2019-06-17 DIAGNOSIS — R7303 Prediabetes: Secondary | ICD-10-CM | POA: Diagnosis not present

## 2019-06-17 DIAGNOSIS — I10 Essential (primary) hypertension: Secondary | ICD-10-CM | POA: Diagnosis not present

## 2019-06-17 DIAGNOSIS — E782 Mixed hyperlipidemia: Secondary | ICD-10-CM | POA: Diagnosis not present

## 2019-06-20 ENCOUNTER — Telehealth: Payer: Self-pay | Admitting: Cardiology

## 2019-06-20 NOTE — Telephone Encounter (Signed)
Has questions about his eliquis

## 2019-06-20 NOTE — Telephone Encounter (Signed)
Telephone call to patient. Asking to come off of eliquis and go back on aspirin.Has history off atrial fib. 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 Also states needs either an echo or stress test per DOT paperwork. License runs out Wednesday. Asked to see Dr. Bettina Gavia. Made appointment for 06/21/19 at [place].

## 2019-06-21 ENCOUNTER — Ambulatory Visit (INDEPENDENT_AMBULATORY_CARE_PROVIDER_SITE_OTHER): Payer: Medicare Other | Admitting: Cardiology

## 2019-06-21 ENCOUNTER — Other Ambulatory Visit: Payer: Self-pay

## 2019-06-21 ENCOUNTER — Encounter: Payer: Self-pay | Admitting: Cardiology

## 2019-06-21 VITALS — BP 142/74 | HR 79 | Ht 68.0 in | Wt 179.0 lb

## 2019-06-21 DIAGNOSIS — I480 Paroxysmal atrial fibrillation: Secondary | ICD-10-CM

## 2019-06-21 DIAGNOSIS — Q211 Atrial septal defect: Secondary | ICD-10-CM | POA: Diagnosis not present

## 2019-06-21 DIAGNOSIS — Q2112 Patent foramen ovale: Secondary | ICD-10-CM

## 2019-06-21 DIAGNOSIS — Z7901 Long term (current) use of anticoagulants: Secondary | ICD-10-CM | POA: Diagnosis not present

## 2019-06-21 NOTE — Progress Notes (Signed)
Cardiology Office Note:    Date:  06/21/2019   ID:  [name], DOB [birth year], MRN [id number]  PCP:  Maryella Shivers, MD  Cardiologist:  Shirlee More, MD    Referring MD: Maryella Shivers, MD    ASSESSMENT:    1. PAF (paroxysmal atrial fibrillation) ([place])   2. Chronic anticoagulation   3. PFO (patent foramen ovale)    PLAN:    In order of problems listed above:  1. His issue is the need for anticoagulant therapy with stroke PFO and atrial fibrillation paroxysmal.  He asked about alternatives and after discussion of options referred to [facility] for discussion about closure of PFO and watchman device.  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   Next appointment: 6 months   Medication Adjustments/Labs and Tests Ordered: Current medicines are reviewed at length with the patient today.  Concerns regarding medicines are outlined above.  No orders of the defined types were placed in this encounter.  No orders of the defined types were placed in this encounter.   Chief Complaint  Patient presents with  . Advice Only  . Anticoagulation    can I stop Eliquis    History of Present Illness:    Mathew Leach is a 68 y.o. 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  He has a history of PFO had a very significant right-to-left shunt was noted to have atrial fibrillation.  He has been anticoagulated is had no recurrent neurologic event.  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  He wants to pursue this and I will refer him to Dr. Shelda Jakes, [facility].  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  He has had no TIA bleeding palpitation shortness of breath or chest pain.  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 ASD (atrial septal defect) 06/10/2015  . Atrial flutter, paroxysmal ([place]) 08/01/2015  . Chronic anticoagulation 08/29/2015  . Chronic pain of right knee 12/07/2014  . Dysrhythmia    AFIB  . Hypertension   . Obstructive sleep apnea syndrome 06/26/2015  . PAF (paroxysmal atrial fibrillation) ([place]) 07/30/2015  . Pericarditis    1999  . Preoperative cardiovascular examination 02/03/2016  . 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 Shortness of breath dyspnea    W/ EXERTION   . Sleep apnea    CPAP  . Stroke [facility])    04-2015    Past Surgical History:  Procedure Laterality Date  . CYST REMOVAL NECK     AGE 58  . INGUINAL HERNIA REPAIR     LEFT AS CHILD  . TOTAL KNEE ARTHROPLASTY Right 03/17/2016   Procedure: TOTAL KNEE ARTHROPLASTY;  Surgeon: [name], MD;  Location: [place];  Service: Orthopedics;  Laterality: Right;    Current Medications: Current Meds  Medication Sig  . amLODipine-benazepril (LOTREL) 10-20 MG capsule Take 1 capsule  by mouth daily.  Marland Kitchen ELIQUIS 5 MG TABS tablet TAKE 1 TABLET TWICE A DAY (DISCONTINUE ASPIRIN)  . folic acid (FOLVITE) 1 MG tablet Take 1 mg by mouth daily.   . Omega-3 Fatty Acids (FISH OIL) 1000 MG CAPS Take 1 capsule by mouth daily.  . simvastatin (ZOCOR) 20 MG tablet Take 20 mg by mouth daily.     Allergies:   Lipitor [atorvastatin]   Social History   Socioeconomic History  . Marital status: Married    Spouse name: Not on file  . Number of children: Not on file  . Years of education: Not on file  . Highest education level: Not on file  Occupational History  . Not on file  Social Needs  . Financial resource  strain: Not on file  . Food insecurity    Worry: Not on file    Inability: Not on file  . Transportation needs    Medical: Not on file    Non-medical: Not on file  Tobacco Use  . Smoking status: Never Smoker  . Smokeless tobacco: Never Used  Substance and Sexual Activity  . Alcohol use: No  . Drug use: No  . Sexual activity: Not on file  Lifestyle  . Physical activity    Days per week: Not on file    Minutes per session: Not on file  . Stress: Not on file  Relationships  . Social Herbalist on phone: Not on file    Gets together: Not on file    Attends religious service: Not on file    Active member of club or organization: Not on file    Attends meetings of clubs or organizations: Not on file    Relationship status: Not on file  Other Topics Concern  . Not on file  Social History Narrative  . Not on file     Family History: The patient's family history includes Cancer in his father. ROS:   Please see the history of present illness.    All other systems reviewed and are negative.  EKGs/Labs/Other Studies Reviewed:    The following studies were reviewed today  Recent Labs: No results found for requested labs within last 8760 hours.  Recent Lipid Panel No results found for: CHOL, TRIG, HDL, CHOLHDL, VLDL, LDLCALC, LDLDIRECT  Physical Exam:    VS:  BP (!) 142/74 (BP Location: Left Arm, Patient Position: Sitting, Cuff Size: Normal)   Pulse 79   Ht 5\' 8"  (1.727 m)   Wt 179 lb (81.2 kg)   SpO2 96%   BMI 27.22 kg/m     Wt Readings from Last 3 Encounters:  06/21/19 179 lb (81.2 kg)  06/13/19 178 lb (80.7 kg)  06/17/18 169 lb (76.7 kg)     GEN:  Well nourished, well developed in no acute distress HEENT: Normal NECK: No JVD; No carotid bruits LYMPHATICS: No lymphadenopathy CARDIAC: RRR, no murmurs, rubs, gallops RESPIRATORY:  Clear to auscultation without rales, wheezing or rhonchi  ABDOMEN: Soft, non-tender, non-distended MUSCULOSKELETAL:  No  edema; No deformity  SKIN: Warm and dry NEUROLOGIC:  Alert and oriented x 3 PSYCHIATRIC:  Normal affect    Signed, Shirlee More, MD  06/21/2019 8:24 AM    [place]

## 2019-06-21 NOTE — Patient Instructions (Signed)
Medication Instructions:  Your physician recommends that you continue on your current medications as directed. Please refer to the Current Medication list given to you today.  If you need a refill on your cardiac medications before your next appointment, please call your pharmacy.   Lab work: NOne If you have labs (blood work) drawn today and your tests are completely normal, you will receive your results only by: Marland Kitchen MyChart Message (if you have MyChart) OR . A paper copy in the mail If you have any lab test that is abnormal or we need to change your treatment, we will call you to review the results.  Testing/Procedures: None  Follow-Up: At [facility], you and your health needs are our priority.  As part of our continuing mission to provide you with exceptional heart care, we have created designated Provider Care Teams.  These Care Teams include your primary Cardiologist (physician) and Advanced Practice Providers (APPs -  Physician Assistants and Nurse Practitioners) who all work together to provide you with the care you need, when you need it. You will need a follow up appointment in 6 months.  Any Other Special Instructions Will Be Listed Below (If Applicable).

## 2019-06-30 DIAGNOSIS — Q211 Atrial septal defect: Secondary | ICD-10-CM | POA: Diagnosis not present

## 2019-06-30 DIAGNOSIS — I4892 Unspecified atrial flutter: Secondary | ICD-10-CM | POA: Diagnosis not present

## 2019-06-30 DIAGNOSIS — I480 Paroxysmal atrial fibrillation: Secondary | ICD-10-CM | POA: Diagnosis not present

## 2019-06-30 DIAGNOSIS — I119 Hypertensive heart disease without heart failure: Secondary | ICD-10-CM | POA: Diagnosis not present

## 2019-07-11 DIAGNOSIS — I480 Paroxysmal atrial fibrillation: Secondary | ICD-10-CM | POA: Diagnosis not present

## 2019-07-18 DIAGNOSIS — I10 Essential (primary) hypertension: Secondary | ICD-10-CM | POA: Diagnosis not present

## 2019-07-18 DIAGNOSIS — E782 Mixed hyperlipidemia: Secondary | ICD-10-CM | POA: Diagnosis not present

## 2019-07-18 DIAGNOSIS — I4892 Unspecified atrial flutter: Secondary | ICD-10-CM | POA: Diagnosis not present

## 2019-07-18 DIAGNOSIS — R7303 Prediabetes: Secondary | ICD-10-CM | POA: Diagnosis not present

## 2019-07-19 HISTORY — PX: OTHER SURGICAL HISTORY: SHX169

## 2019-08-01 DIAGNOSIS — Z1159 Encounter for screening for other viral diseases: Secondary | ICD-10-CM | POA: Diagnosis not present

## 2019-08-03 DIAGNOSIS — I10 Essential (primary) hypertension: Secondary | ICD-10-CM | POA: Diagnosis present

## 2019-08-03 DIAGNOSIS — Z9889 Other specified postprocedural states: Secondary | ICD-10-CM | POA: Diagnosis not present

## 2019-08-03 DIAGNOSIS — Q211 Atrial septal defect: Secondary | ICD-10-CM | POA: Diagnosis not present

## 2019-08-03 DIAGNOSIS — I480 Paroxysmal atrial fibrillation: Secondary | ICD-10-CM | POA: Diagnosis not present

## 2019-08-03 DIAGNOSIS — Z20828 Contact with and (suspected) exposure to other viral communicable diseases: Secondary | ICD-10-CM | POA: Diagnosis present

## 2019-08-03 DIAGNOSIS — Z8673 Personal history of transient ischemic attack (TIA), and cerebral infarction without residual deficits: Secondary | ICD-10-CM | POA: Diagnosis not present

## 2019-08-03 DIAGNOSIS — E119 Type 2 diabetes mellitus without complications: Secondary | ICD-10-CM | POA: Diagnosis present

## 2019-08-03 DIAGNOSIS — I4891 Unspecified atrial fibrillation: Secondary | ICD-10-CM | POA: Diagnosis not present

## 2019-08-03 DIAGNOSIS — E785 Hyperlipidemia, unspecified: Secondary | ICD-10-CM | POA: Diagnosis present

## 2019-08-03 DIAGNOSIS — Z006 Encounter for examination for normal comparison and control in clinical research program: Secondary | ICD-10-CM | POA: Diagnosis not present

## 2019-08-17 DIAGNOSIS — E782 Mixed hyperlipidemia: Secondary | ICD-10-CM | POA: Diagnosis not present

## 2019-08-17 DIAGNOSIS — I4892 Unspecified atrial flutter: Secondary | ICD-10-CM | POA: Diagnosis not present

## 2019-08-17 DIAGNOSIS — I10 Essential (primary) hypertension: Secondary | ICD-10-CM | POA: Diagnosis not present

## 2019-08-17 DIAGNOSIS — R7303 Prediabetes: Secondary | ICD-10-CM | POA: Diagnosis not present

## 2019-08-24 DIAGNOSIS — E782 Mixed hyperlipidemia: Secondary | ICD-10-CM | POA: Diagnosis not present

## 2019-08-24 DIAGNOSIS — I10 Essential (primary) hypertension: Secondary | ICD-10-CM | POA: Diagnosis not present

## 2019-08-24 DIAGNOSIS — R7303 Prediabetes: Secondary | ICD-10-CM | POA: Diagnosis not present

## 2019-08-24 DIAGNOSIS — Z125 Encounter for screening for malignant neoplasm of prostate: Secondary | ICD-10-CM | POA: Diagnosis not present

## 2019-08-31 DIAGNOSIS — E782 Mixed hyperlipidemia: Secondary | ICD-10-CM | POA: Diagnosis not present

## 2019-08-31 DIAGNOSIS — R7303 Prediabetes: Secondary | ICD-10-CM | POA: Diagnosis not present

## 2019-08-31 DIAGNOSIS — I482 Chronic atrial fibrillation, unspecified: Secondary | ICD-10-CM | POA: Diagnosis not present

## 2019-08-31 DIAGNOSIS — I10 Essential (primary) hypertension: Secondary | ICD-10-CM | POA: Diagnosis not present

## 2019-09-13 DIAGNOSIS — Z01812 Encounter for preprocedural laboratory examination: Secondary | ICD-10-CM | POA: Diagnosis not present

## 2019-09-13 DIAGNOSIS — Z20828 Contact with and (suspected) exposure to other viral communicable diseases: Secondary | ICD-10-CM | POA: Diagnosis not present

## 2019-09-15 DIAGNOSIS — Z7982 Long term (current) use of aspirin: Secondary | ICD-10-CM | POA: Diagnosis not present

## 2019-09-15 DIAGNOSIS — I6300 Cerebral infarction due to thrombosis of unspecified precerebral artery: Secondary | ICD-10-CM | POA: Diagnosis not present

## 2019-09-15 DIAGNOSIS — I10 Essential (primary) hypertension: Secondary | ICD-10-CM | POA: Diagnosis not present

## 2019-09-15 DIAGNOSIS — I480 Paroxysmal atrial fibrillation: Secondary | ICD-10-CM | POA: Diagnosis not present

## 2019-09-16 DIAGNOSIS — I10 Essential (primary) hypertension: Secondary | ICD-10-CM | POA: Diagnosis not present

## 2019-09-16 DIAGNOSIS — R7303 Prediabetes: Secondary | ICD-10-CM | POA: Diagnosis not present

## 2019-09-16 DIAGNOSIS — E782 Mixed hyperlipidemia: Secondary | ICD-10-CM | POA: Diagnosis not present

## 2019-09-16 DIAGNOSIS — I482 Chronic atrial fibrillation, unspecified: Secondary | ICD-10-CM | POA: Diagnosis not present

## 2019-09-21 DIAGNOSIS — D1800 Hemangioma unspecified site: Secondary | ICD-10-CM | POA: Diagnosis not present

## 2019-09-21 DIAGNOSIS — Z1283 Encounter for screening for malignant neoplasm of skin: Secondary | ICD-10-CM | POA: Diagnosis not present

## 2019-09-21 DIAGNOSIS — D2362 Other benign neoplasm of skin of left upper limb, including shoulder: Secondary | ICD-10-CM | POA: Diagnosis not present

## 2019-09-21 DIAGNOSIS — Z86018 Personal history of other benign neoplasm: Secondary | ICD-10-CM | POA: Diagnosis not present

## 2019-09-21 DIAGNOSIS — L570 Actinic keratosis: Secondary | ICD-10-CM | POA: Diagnosis not present

## 2019-09-21 DIAGNOSIS — D2230 Melanocytic nevi of unspecified part of face: Secondary | ICD-10-CM | POA: Diagnosis not present

## 2019-09-21 DIAGNOSIS — L820 Inflamed seborrheic keratosis: Secondary | ICD-10-CM | POA: Diagnosis not present

## 2019-09-21 DIAGNOSIS — L578 Other skin changes due to chronic exposure to nonionizing radiation: Secondary | ICD-10-CM | POA: Diagnosis not present

## 2019-09-21 DIAGNOSIS — L814 Other melanin hyperpigmentation: Secondary | ICD-10-CM | POA: Diagnosis not present

## 2019-10-24 DIAGNOSIS — L570 Actinic keratosis: Secondary | ICD-10-CM | POA: Diagnosis not present

## 2019-12-19 DIAGNOSIS — L578 Other skin changes due to chronic exposure to nonionizing radiation: Secondary | ICD-10-CM | POA: Diagnosis not present

## 2019-12-19 DIAGNOSIS — L820 Inflamed seborrheic keratosis: Secondary | ICD-10-CM | POA: Diagnosis not present

## 2019-12-19 DIAGNOSIS — L570 Actinic keratosis: Secondary | ICD-10-CM | POA: Diagnosis not present

## 2020-01-12 ENCOUNTER — Encounter: Payer: Self-pay | Admitting: Nurse Practitioner

## 2020-01-12 ENCOUNTER — Other Ambulatory Visit (INDEPENDENT_AMBULATORY_CARE_PROVIDER_SITE_OTHER): Payer: Medicare Other

## 2020-01-12 ENCOUNTER — Other Ambulatory Visit: Payer: Self-pay

## 2020-01-12 ENCOUNTER — Ambulatory Visit (INDEPENDENT_AMBULATORY_CARE_PROVIDER_SITE_OTHER): Payer: Medicare Other | Admitting: Nurse Practitioner

## 2020-01-12 VITALS — BP 116/60 | HR 81 | Temp 97.7°F | Ht 68.0 in | Wt 185.5 lb

## 2020-01-12 DIAGNOSIS — K921 Melena: Secondary | ICD-10-CM

## 2020-01-12 DIAGNOSIS — R58 Hemorrhage, not elsewhere classified: Secondary | ICD-10-CM | POA: Diagnosis not present

## 2020-01-12 DIAGNOSIS — R197 Diarrhea, unspecified: Secondary | ICD-10-CM | POA: Insufficient documentation

## 2020-01-12 DIAGNOSIS — Z01818 Encounter for other preprocedural examination: Secondary | ICD-10-CM

## 2020-01-12 LAB — CBC
HCT: 39.7 % (ref 39.0–52.0)
Hemoglobin: 13.5 g/dL (ref 13.0–17.0)
MCHC: 33.9 g/dL (ref 30.0–36.0)
MCV: 89.1 fl (ref 78.0–100.0)
Platelets: 297 10*3/uL (ref 150.0–400.0)
RBC: 4.45 Mil/uL (ref 4.22–5.81)
RDW: 13.8 % (ref 11.5–15.5)
WBC: 6.5 10*3/uL (ref 4.0–10.5)

## 2020-01-12 MED ORDER — CLENPIQ 10-3.5-12 MG-GM -GM/160ML PO SOLN: 1.0000 | ORAL | 0 refills | Status: DC

## 2020-01-12 NOTE — Progress Notes (Signed)
01/12/2020 Mathew Leach [id number] 27-Aug-1951   CHIEF COMPLAINT: Rectal bleeding   HISTORY OF PRESENT ILLNESS:  Mathew Leach is a 69 year old male past medical history significant for hypertension, CVA 2016, patent foramen ovale, paroxysmal atrial fibrillation on Plavix, atrial septal defect, pericarditis 1999, OSA uses Cpap, esophageal ulcers and GERD.  S/P total right knee replacement 2017.  He presents today accompanied with his wife for further evaluation regarding rectal bleeding. He passed a normal bowel movement on Monday 2/22 and he saw bright red and darker red blood on the toilet tissue and in the toilet water. No associated abdominal or rectal pain. He passed a 2nd BM later in the day with bright red and darker blood. Tuesday he passed 2 BMs with blood. Wed no BM or blood per the rectum. Today, he passed a normal BM without any blood. No unusual fatigue, chest pain or shortness of breath. He is taking ASA 81mg  once daily and Plavix 75mg  once daily. History of afib and a CVA. He underwent a watchman procedure for atrial fibrillation by Dr. Denman Leach at [facility] 07/2019. He will take his last dose of Plavix tomorrow then remain on ASA 81mg  daily.   He also reports having intermittent diarrhea, sometimes explosive diarrhea once weekly. He takes Imodium one tab as needed. No bloody diarrhea. He drinks 1 cup of coffee daily with 2 packets of Splenda. He eats ice cream most nights. No recent antibiotics.    EGD 07/31/2011 by Dr. Lyndel Leach:  1. Healed esophageal erosions with irregular Z-line, asymptomatic esophageal stricture with 2 small esophageal proximal diverticula. Biopsies chronically squamous and gastric type mucosa.  Negative for Barrett's mucosa. 2. Small hiatal hernia.  EGD 04/10/2011:  1.There were 2 distinct distal esophageal ulcers measuring 1 cm each at the gastroesophageal junction with a benign-appearing esophageal stricture.  2. Small hiatal hernia.  Colonoscopy 04/10/2011 by Dr.  Lyndel Leach:  Moderately predominantly sigmoid diverticulosis. Small internal hemorrhoids.  Repeat colonoscopy in 5 years.   Past Medical History:  Diagnosis Date  . ASD (atrial septal defect) 06/10/2015  . Atrial flutter, paroxysmal ([place]) 08/01/2015  . Chronic anticoagulation 08/29/2015  . Chronic pain of right knee 12/07/2014  . Dysrhythmia    AFIB  . Hypertension   . Obstructive sleep apnea syndrome 06/26/2015  . PAF (paroxysmal atrial fibrillation) ([place]) 07/30/2015  . Pericarditis    1999  . Preoperative cardiovascular examination 02/03/2016  . 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 Shortness of breath dyspnea    W/ EXERTION   . Sleep apnea    CPAP  . Stroke [facility])    04-2015   Past Surgical History:  Procedure Laterality Date  . CYST REMOVAL NECK     AGE 31  . INGUINAL HERNIA REPAIR     LEFT AS CHILD  . TOTAL KNEE ARTHROPLASTY Right 03/17/2016   Procedure: TOTAL KNEE ARTHROPLASTY;  Surgeon: Mathew Huger, MD;  Location: [facility];  Service: Orthopedics;  Laterality: Right;    Family History:  Father had ? cancer.   Social History: Married. Semi-retired/works part time. Nonsmoker. No alcohol use.    Outpatient Encounter Medications as of 01/12/2020  Medication Sig  . amLODipine-benazepril (LOTREL) 10-20 MG capsule Take 1 capsule by mouth daily.  Marland Kitchen ELIQUIS 5 MG TABS tablet TAKE 1 TABLET TWICE A DAY (DISCONTINUE ASPIRIN)  . folic acid (FOLVITE) 1 MG tablet Take 1 mg by mouth daily.   . Omega-3 Fatty Acids (FISH OIL) 1000 MG CAPS Take  1 capsule by mouth daily.  . simvastatin (ZOCOR) 20 MG tablet Take 20 mg by mouth daily.   No facility-administered encounter medications on file as of 01/12/2020.   Allergies  Allergen Reactions  . Lipitor [Atorvastatin]     Other reaction(s): Other (See Comments) Double vision DOUBLE VISION Other reaction(s): Other DOUBLE VISION DOUBLE VISION    REVIEW OF SYSTEMS:  Gen: Denies fever, sweats or chills. No weight loss.    CV: Denies chest pain, palpitations or edema. Resp: Denies cough, shortness of breath of hemoptysis.  GI: Denies heartburn, dysphagia, stomach or lower abdominal pain. No diarrhea or constipation.  GU : Denies urinary burning, blood in urine, increased urinary frequency or incontinence. MS: Denies joint pain, muscles aches or weakness. Derm: Denies rash, itchiness, skin lesions or unhealing ulcers. Psych: Denies depression, anxiety, memory loss, suicidal ideation and confusion. Heme: Denies bruising, bleeding. Neuro:  Denies headaches, dizziness or paresthesias. Endo:  Denies any problems with DM, thyroid or adrenal function.  PHYSICAL EXAM: BP 116/60   Pulse 81   Temp 97.7 F (36.5 C)   Ht 5\' 8"  (1.727 m)   Wt 185 lb 8 oz (84.1 kg)   BMI 28.21 kg/m  General: Well developed 69 year old male in no acute distress. Head: Normocephalic and atraumatic. Eyes:  Sclerae non-icteric, conjunctive pink. Ears: Normal auditory acuity. Mouth: Dentition intact. No ulcers or lesions.  Neck: Supple, no lymphadenopathy or thyromegaly.  Lungs: Clear bilaterally to auscultation without wheezes, crackles or rhonchi. Heart: Regular rate and rhythm. No murmur, rub or gallop appreciated.  Abdomen: Soft, nontender, non distended. No masses. No hepatosplenomegaly. Normoactive bowel sounds x 4 quadrants.  Rectal: Posterior anal tag. No external hemorrhoids. Internal hemorrhoids palpated R > L. No blood or stool in the rectal vault.  Musculoskeletal: Symmetrical with no gross deformities. Skin: Warm and dry. No rash or lesions on visible extremities. Extremities: No edema. Neurological: Alert oriented x 4, no focal deficits.  Psychological:  Alert and cooperative. Normal mood and affect.  ASSESSMENT AND PLAN:  64. 69 year old male with hematochezia, possible hemorrhoidal verses diverticular bleed.  -Colonoscopy to rule out colorectal cancer after 2 days of hematochezia. Colonoscopy benefits and risks  discussed including risk with sedation, risk of bleeding, perforation and infection  -CBC -Desitin PRN for hemorrhoidal care  -Patient to go to the local ER if he develops severe rectal bleeding   2. Intermittent diarrhea. IBS. Lactose intolerance.  -Lactaid 1 to 2 tabs with each dairy product. Imodium 1 tab PRN.  -Colonoscopy to assess for colitis, see plan #1 -Patient to call office if diarrhea worsens.   3. History of PFO. Paroxysmal atrial fibrillation. CHADSVASC4 (stoke, htn, age). S/P Watchman device procedure 07/2019. Currently on ASA on Plavix. Plavix to be discontinued 01/13/2020.   4 History of a CVA 2016  5. OSA uses Cpap     CC:  Maryella Shivers, MD

## 2020-01-12 NOTE — Patient Instructions (Addendum)
If you are age 71 or older, your body mass index should be between 23-30. Your Body mass index is 28.21 kg/m. If this is out of the aforementioned range listed, please consider follow up with your Primary Care Provider.  If you are age 41 or younger, your body mass index should be between 19-25. Your Body mass index is 28.21 kg/m. If this is out of the aformentioned range listed, please consider follow up with your Primary Care Provider.   Your provider has requested that you go to the basement level for lab work before leaving today. Press "B" on the elevator. The lab is located at the first door on the left as you exit the elevator.  We have sent the following medications to your pharmacy for you to pick up at your convenience:  Clenpiq  1.Call our office if you have further rectal bleeding. 2. Go to the Emergency room if you have severe rectal bleeding. 3.Lactaid tablets 1-2 daily with dairy products. 4. Apply Desitin a small pea size amount inside the anal opening and to the external area.  Due to recent changes in healthcare laws, you may see the results of your imaging and laboratory studies on MyChart before your provider has had a chance to review them.  We understand that in some cases there may be results that are confusing or concerning to you. Not all laboratory results come back in the same time frame and the provider may be waiting for multiple results in order to interpret others.  Please give [place] 48 hours in order for your provider to thoroughly review all the results before contacting the office for clarification of your results.   Thank you for choosing [facility] Noralyn Pick, CRNP

## 2020-01-13 DIAGNOSIS — Z23 Encounter for immunization: Secondary | ICD-10-CM | POA: Diagnosis not present

## 2020-01-15 NOTE — Progress Notes (Signed)
Agree with the plan RG 

## 2020-01-26 ENCOUNTER — Ambulatory Visit (INDEPENDENT_AMBULATORY_CARE_PROVIDER_SITE_OTHER): Payer: Medicare Other

## 2020-01-26 ENCOUNTER — Other Ambulatory Visit: Payer: Self-pay | Admitting: Gastroenterology

## 2020-01-26 ENCOUNTER — Other Ambulatory Visit: Payer: Self-pay

## 2020-01-26 DIAGNOSIS — Z1159 Encounter for screening for other viral diseases: Secondary | ICD-10-CM | POA: Diagnosis not present

## 2020-01-27 LAB — SARS CORONAVIRUS 2 (TAT 6-24 HRS): SARS Coronavirus 2: NEGATIVE

## 2020-01-30 ENCOUNTER — Encounter: Payer: Self-pay | Admitting: Gastroenterology

## 2020-01-30 ENCOUNTER — Other Ambulatory Visit: Payer: Self-pay

## 2020-01-30 ENCOUNTER — Ambulatory Visit (AMBULATORY_SURGERY_CENTER): Payer: Medicare Other | Admitting: Gastroenterology

## 2020-01-30 VITALS — BP 121/71 | HR 61 | Temp 96.6°F | Resp 11 | Ht 68.0 in | Wt 185.0 lb

## 2020-01-30 DIAGNOSIS — D122 Benign neoplasm of ascending colon: Secondary | ICD-10-CM

## 2020-01-30 DIAGNOSIS — K625 Hemorrhage of anus and rectum: Secondary | ICD-10-CM | POA: Diagnosis not present

## 2020-01-30 DIAGNOSIS — D128 Benign neoplasm of rectum: Secondary | ICD-10-CM | POA: Diagnosis not present

## 2020-01-30 DIAGNOSIS — K5730 Diverticulosis of large intestine without perforation or abscess without bleeding: Secondary | ICD-10-CM | POA: Diagnosis not present

## 2020-01-30 DIAGNOSIS — D120 Benign neoplasm of cecum: Secondary | ICD-10-CM | POA: Diagnosis not present

## 2020-01-30 DIAGNOSIS — K921 Melena: Secondary | ICD-10-CM | POA: Diagnosis not present

## 2020-01-30 DIAGNOSIS — K648 Other hemorrhoids: Secondary | ICD-10-CM | POA: Diagnosis not present

## 2020-01-30 DIAGNOSIS — I4891 Unspecified atrial fibrillation: Secondary | ICD-10-CM | POA: Diagnosis not present

## 2020-01-30 DIAGNOSIS — D123 Benign neoplasm of transverse colon: Secondary | ICD-10-CM

## 2020-01-30 DIAGNOSIS — G4733 Obstructive sleep apnea (adult) (pediatric): Secondary | ICD-10-CM | POA: Diagnosis not present

## 2020-01-30 HISTORY — PX: COLONOSCOPY: SHX174

## 2020-01-30 MED ORDER — HYDROCORTISONE (PERIANAL) 2.5 % EX CREA: 1.0000 "application " | TOPICAL_CREAM | Freq: Two times a day (BID) | CUTANEOUS | 2 refills | Status: AC

## 2020-01-30 MED ORDER — SODIUM CHLORIDE 0.9 % IV SOLN: 500.0000 mL | Freq: Once | INTRAVENOUS | Status: DC

## 2020-01-30 NOTE — Patient Instructions (Signed)
YOU HAD AN ENDOSCOPIC PROCEDURE TODAY AT [facility]:   Refer to the procedure report that was given to you for any specific questions about what was found during the examination.  If the procedure report does not answer your questions, please call your gastroenterologist to clarify.  If you requested that your care partner not be given the details of your procedure findings, then the procedure report has been included in a sealed envelope for you to review at your convenience later.  YOU SHOULD EXPECT: Some feelings of bloating in the abdomen. Passage of more gas than usual.  Walking can help get rid of the air that was put into your GI tract during the procedure and reduce the bloating. If you had a lower endoscopy (such as a colonoscopy or flexible sigmoidoscopy) you may notice spotting of blood in your stool or on the toilet paper. If you underwent a bowel prep for your procedure, you may not have a normal bowel movement for a few days.  Please Note:  You might notice some irritation and congestion in your nose or some drainage.  This is from the oxygen used during your procedure.  There is no need for concern and it should clear up in a day or so.  SYMPTOMS TO REPORT IMMEDIATELY:   Following lower endoscopy (colonoscopy or flexible sigmoidoscopy):  Excessive amounts of blood in the stool  Significant tenderness or worsening of abdominal pains  Swelling of the abdomen that is new, acute  Fever of 100F or higher   For urgent or emergent issues, a gastroenterologist can be reached at any hour by calling [phone]. Do not use MyChart messaging for urgent concerns.    DIET:  We do recommend a small meal at first, but then you may proceed to your regular diet.  Drink plenty of fluids but you should avoid alcoholic beverages for 24 hours. Follow a High Fiber Diet (see handout given to you by your recovery nurse).  MEDICATIONS: Continue present medications. No Aspirin,  Ibuprofen, Naproxen, or other non-steroidal anti-inflammatory drugs for 5 days after polyp removal. Use HC cream 2.5%: Apply externally twice daily for 10 days (2 refills).  Follow up with Dr. Lyndel Safe in his office as needed.  Please see handouts given to you by your recovery nurse.  ACTIVITY:  You should plan to take it easy for the rest of today and you should NOT DRIVE or use heavy machinery until tomorrow (because of the sedation medicines used during the test).    FOLLOW UP: Our staff will call the number listed on your records 48-72 hours following your procedure to check on you and address any questions or concerns that you may have regarding the information given to you following your procedure. If we do not reach you, we will leave a message.  We will attempt to reach you two times.  During this call, we will ask if you have developed any symptoms of COVID 19. If you develop any symptoms (ie: fever, flu-like symptoms, shortness of breath, cough etc.) before then, please call [phone].  If you test positive for Covid 19 in the 2 weeks post procedure, please call and report this information to [place].    If any biopsies were taken you will be contacted by phone or by letter within the next 1-3 weeks.  Please call us at [phone] if you have not heard about the biopsies in 3 weeks.   Thank you for allowing [place] to provide  for your healthcare needs today.   SIGNATURES/CONFIDENTIALITY: You and/or your care partner have signed paperwork which will be entered into your electronic medical record.  These signatures attest to the fact that that the information above on your After Visit Summary has been reviewed and is understood.  Full responsibility of the confidentiality of this discharge information lies with you and/or your care-partner.

## 2020-01-30 NOTE — Progress Notes (Signed)
Pt's states no medical or surgical changes since previsit or office visit.  Temp -  JB Vitals - DT  

## 2020-01-30 NOTE — Progress Notes (Signed)
Report given to PACU, vss 

## 2020-01-30 NOTE — Progress Notes (Signed)
Called to room to assist during endoscopic procedure.  Patient ID and intended procedure confirmed with present staff. Received instructions for my participation in the procedure from the performing physician.  

## 2020-01-30 NOTE — Op Note (Signed)
[facility] Patient Name: Mathew Leach Procedure Date: 01/30/2020 2:03 PM MRN: [id number] Endoscopist: Jackquline Denmark , MD Age: 69 Referring MD:  Date of Birth: Jan 08, 1951 Gender: Male Account #: [id number] Procedure:                Colonoscopy Indications:              Rectal bleeding Medicines:                Monitored Anesthesia Care Procedure:                Pre-Anesthesia Assessment:                           - Prior to the procedure, a History and Physical                            was performed, and patient medications and                            allergies were reviewed. The patient's tolerance of                            previous anesthesia was also reviewed. The risks                            and benefits of the procedure and the sedation                            options and risks were discussed with the patient.                            All questions were answered, and informed consent                            was obtained. Prior Anticoagulants: The patient has                            taken no previous anticoagulant or antiplatelet                            agents. ASA Grade Assessment: II - A patient with                            mild systemic disease. After reviewing the risks                            and benefits, the patient was deemed in                            satisfactory condition to undergo the procedure.                           After obtaining informed consent, the colonoscope  was passed under direct vision. Throughout the                            procedure, the patient's blood pressure, pulse, and                            oxygen saturations were monitored continuously. The                            Colonoscope was introduced through the anus and                            advanced to the the cecum, identified by                            appendiceal orifice and ileocecal valve. The                colonoscopy was performed without difficulty. The                            patient tolerated the procedure well. The quality                            of the bowel preparation was good. The ileocecal                            valve, appendiceal orifice, and rectum were                            photographed. Scope In: 2:14:16 PM Scope Out: 2:30:56 PM Scope Withdrawal Time: 0 hours 13 minutes 49 seconds  Total Procedure Duration: 0 hours 16 minutes 40 seconds  Findings:                 A 10 mm polyp was found in the cecum. The polyp was                            sessile. The polyp was removed with a hot snare.                            Resection and retrieval were complete. Estimated                            blood loss: none.                           Four sessile polyps were found in the rectum,                            transverse colon (2) and ascending colon. The                            polyps were 4 to 6 mm in size. These polyps were  removed with a cold snare. Resection and retrieval                            were complete.                           Multiple small and large-mouthed diverticula were                            found in the sigmoid colon, descending colon and                            few in ascending colon.                           Non-bleeding external and internal hemorrhoids were                            found during retroflexion and during perianal exam.                            The hemorrhoids were small.                           The exam was otherwise without abnormality on                            direct and retroflexion views. Complications:            No immediate complications. Estimated Blood Loss:     Estimated blood loss: none. Impression:               -Colonic polyps s/p polypectomy.                           -Pancolonic diverticulosis predominantly in the                            left  colon.                           -Non-bleeding external and internal hemorrhoids.                           -Otherwise normal colonoscopy. Recommendation:           - Patient has a contact number available for                            emergencies. The signs and symptoms of potential                            delayed complications were discussed with the                            patient. Return to normal activities tomorrow.  Written discharge instructions were provided to the                            patient.                           - High fiber diet.                           - Continue present medications.                           - Await pathology results.                           - Repeat colonoscopy for surveillance based on                            pathology results.                           - No aspirin, ibuprofen, naproxen, or other                            non-steroidal anti-inflammatory drugs for 5 days                            after polyp removal.                           - Use HC Cream 2.5%: Apply externally BID for 10                            days. 2 refills.                           - Return to GI clinic PRN.                           - D/W [name]. Jackquline Denmark, MD 01/30/2020 2:37:25 PM This report has been signed electronically.

## 2020-02-01 ENCOUNTER — Telehealth: Payer: Self-pay

## 2020-02-01 NOTE — Telephone Encounter (Signed)
  Follow up Call-  Call back number 01/30/2020  Post procedure Call Back phone  # [phone]  Permission to leave phone message Yes  Some recent data might be hidden     Patient questions:  Do you have a fever, pain , or abdominal swelling? No. Pain Score  0 *  Have you tolerated food without any problems? Yes.    Have you been able to return to your normal activities? Yes.    Do you have any questions about your discharge instructions: Diet   No. Medications  No. Follow up visit  No.  Do you have questions or concerns about your Care? No.  Actions: * If pain score is 4 or above: No action needed, pain <4.  1. Have you developed a fever since your procedure? No  2.   Have you had an respiratory symptoms (SOB or cough) since your procedure? No 3.   Have you tested positive for COVID 19 since your procedure No 4.   Have you had any family members/close contacts diagnosed with the COVID 19 since your procedure?  No   If yes to any of these questions please route to Joylene John, RN and Alphonsa Gin, RN.

## 2020-02-03 ENCOUNTER — Encounter: Payer: Self-pay | Admitting: Gastroenterology

## 2020-02-12 NOTE — Progress Notes (Signed)
Cardiology Office Note:    Date:  02/13/2020   ID:  Mathew Leach, DOB 1951-11-06, MRN [id number]  PCP:  Maryella Shivers, MD  Cardiologist:  Shirlee More, MD    Referring MD: Maryella Shivers, MD    ASSESSMENT:    1. Presence of Watchman left atrial appendage closure device   2. PAF (paroxysmal atrial fibrillation) ([name])   3. PFO (patent foramen ovale)   4. Hypertensive heart disease without heart failure   5. Hyperlipidemia, unspecified hyperlipidemia type    PLAN:    In order of problems listed above:  1. Follow-up TEE showed good parameters off anticoagulants pleased with the quality of his life 2. Stable maintaining sinus rhythm no longer anticoagulated 3. Small features are not favorable and indicative for  closure.  Continue aspirin unless he has recurrent TIA or stroke and then will require consideration of closure 4. BP at target no antihypertensive agents 5. Continue statin check labs including lipid profile and CMP   Next appointment: 1 year   Medication Adjustments/Labs and Tests Ordered: Current medicines are reviewed at length with the patient today.  Concerns regarding medicines are outlined above.  Orders Placed This Encounter  Procedures  . Comp Met (CMET)  . Lipid Profile   No orders of the defined types were placed in this encounter.   No chief complaint on file.   History of Present Illness:    Mathew Leach is a 69 y.o. male with a hx of atrial fibrillation, stroke with PFO and right to left shunt, hypertension and dyslipidemia last seen 06/21/2019.  He had left atrial appendage occlusion [name] last year echocardiogram transesophageal 09/15/2019 showed a small patent foramen ovale.  It was not judged to require closure.    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  No palpitation chest pain shortness of breath syncope  TIA or bleeding complications.  From review of his records his PFO was not high risk and a decision was made not to close send a note to Dr. [name] at [name] and ask for him  to review and contact me and he is concerned that we need to consider closure. Past Medical History:  Diagnosis Date  . Arthritis   . ASD (atrial septal defect) 06/10/2015  . Atrial flutter, paroxysmal ([name]) 08/01/2015  . Chronic anticoagulation 08/29/2015  . Chronic pain of right knee 12/07/2014  . Dysrhythmia    AFIB  . Hypertension   . Kidney stones   . Obstructive sleep apnea syndrome 06/26/2015  . PAF (paroxysmal atrial fibrillation) ([name]) 07/30/2015  . Pericarditis    1999  . PFO (patent foramen ovale) 06/16/2018  . Preoperative cardiovascular examination 02/03/2016  . 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 Shortness of breath dyspnea    W/ EXERTION   . Sleep apnea    CPAP  . Stroke [name])    04-2015    Past Surgical History:  Procedure Laterality Date  . COLONOSCOPY  04/10/2011   Moderate predominantly sigmoid diverticulosis. Small internal hemorrhoids.   . COLONOSCOPY  01/30/2020  . CYST REMOVAL NECK     AGE 16  . ESOPHAGOGASTRODUODENOSCOPY  04/10/2011   Erosive esophagitis with esophageal stricture (asymptomatic strictures since the patient not having any dysphagia). LA grade D esophagitis.  . INGUINAL HERNIA REPAIR     LEFT AS CHILD  . TOTAL KNEE  ARTHROPLASTY Right 03/17/2016   Procedure: TOTAL KNEE ARTHROPLASTY;  Surgeon: [name], MD;  Location: [name];  Service: Orthopedics;  Laterality: Right;  . watchman procedure  07/2019    Current Medications: Current Meds  Medication Sig  . amLODipine-benazepril (LOTREL) 10-20 MG capsule Take 1 capsule by mouth daily.  . ASPIRIN 81 PO Take 1 tablet by mouth daily.  . folic acid (FOLVITE) 1 MG tablet Take 1 mg by mouth daily.   . hydrocortisone (ANUSOL-HC) 2.5 % rectal cream Place 1 application rectally 2 (two) times daily.  Apply externally twice daily for 10 days.  . Omega-3 Fatty Acids (FISH OIL) 1000 MG CAPS Take 1 capsule by mouth daily.  . Pediatric Multiple Vit-C-FA (CHEWABLE VITE CHILDRENS) CHEW Chew by mouth.  . simvastatin (ZOCOR) 20 MG tablet Take 20 mg by mouth daily.  . Vitamin D, Cholecalciferol, 50 MCG (2000 UT) CAPS Take 1 tablet by mouth daily.   Current Facility-Administered Medications for the 02/13/20 encounter (Office Visit) with [name], MD  Medication  . 0.9 %  sodium chloride infusion     Allergies:   Lipitor [atorvastatin]   Social History   Socioeconomic History  . Marital status: Married    Spouse name: Not on file  . Number of children: Not on file  . Years of education: Not on file  . Highest education level: Not on file  Occupational History  . Not on file  Tobacco Use  . Smoking status: Never Smoker  . Smokeless tobacco: Never Used  Substance and Sexual Activity  . Alcohol use: No  . Drug use: No  . Sexual activity: Not on file  Other Topics Concern  . Not on file  Social History Narrative  . Not on file   Social Determinants of Health   Financial Resource Strain:   . Difficulty of Paying Living Expenses:   Food Insecurity:   . Worried About Charity fundraiser in the Last Year:   . Arboriculturist in the Last Year:   Transportation Needs:   . Film/video editor (Medical):   Marland Kitchen Lack of Transportation (Non-Medical):   Physical Activity:   . Days of Exercise per Week:   . Minutes of Exercise per Session:   Stress:   . Feeling of Stress :   Social Connections:   . Frequency of Communication with Friends and Family:   . Frequency of Social Gatherings with Friends and Family:   . Attends Religious Services:   . Active Member of Clubs or Organizations:   . Attends Archivist Meetings:   Marland Kitchen Marital Status:      Family History: The patient's family history includes Lung cancer in his father. There is no history of Colon cancer,  Esophageal cancer, Colon polyps, Rectal cancer, or Stomach cancer. ROS:   Please see the history of present illness.    All other systems reviewed and are negative.  EKGs/Labs/Other Studies Reviewed:    The following studies were reviewed today:  EKG:  EKG ordered today and personally reviewed.  The ekg ordered today demonstrates sinus rhythm first-degree AV block otherwise normal  Recent Labs: 01/12/2020: Hemoglobin 13.5; Platelets 297.0  Recent Lipid Panel No results found for: CHOL, TRIG, HDL, CHOLHDL, VLDL, LDLCALC, LDLDIRECT  Physical Exam:    VS:  BP 122/60   Pulse 76   Temp 97.7 F (36.5 C)   Ht _0  (1.727 m)   Wt 185 lb 3.2 oz (84 kg)  SpO2 100%   BMI 28.16 kg/m     Wt Readings from Last 3 Encounters:  02/13/20 185 lb 3.2 oz (84 kg)  01/30/20 185 lb (83.9 kg)  01/12/20 185 lb 8 oz (84.1 kg)     GEN:  Well nourished, well developed in no acute distress HEENT: Normal NECK: No JVD; No carotid bruits LYMPHATICS: No lymphadenopathy CARDIAC: RRR, no murmurs, rubs, gallops RESPIRATORY:  Clear to auscultation without rales, wheezing or rhonchi  ABDOMEN: Soft, non-tender, non-distended MUSCULOSKELETAL:  No edema; No deformity  SKIN: Warm and dry NEUROLOGIC:  Alert and oriented x 3 PSYCHIATRIC:  Normal affect    Signed, Shirlee More, MD  02/13/2020 10:27 AM    [facility]

## 2020-02-13 ENCOUNTER — Other Ambulatory Visit: Payer: Self-pay

## 2020-02-13 ENCOUNTER — Ambulatory Visit (INDEPENDENT_AMBULATORY_CARE_PROVIDER_SITE_OTHER): Payer: Medicare Other | Admitting: Cardiology

## 2020-02-13 ENCOUNTER — Telehealth: Payer: Self-pay | Admitting: Cardiology

## 2020-02-13 ENCOUNTER — Encounter: Payer: Self-pay | Admitting: Cardiology

## 2020-02-13 VITALS — BP 122/60 | HR 76 | Temp 97.7°F | Ht 68.0 in | Wt 185.2 lb

## 2020-02-13 DIAGNOSIS — I480 Paroxysmal atrial fibrillation: Secondary | ICD-10-CM | POA: Diagnosis not present

## 2020-02-13 DIAGNOSIS — E785 Hyperlipidemia, unspecified: Secondary | ICD-10-CM

## 2020-02-13 DIAGNOSIS — I119 Hypertensive heart disease without heart failure: Secondary | ICD-10-CM | POA: Diagnosis not present

## 2020-02-13 DIAGNOSIS — Q2112 Patent foramen ovale: Secondary | ICD-10-CM

## 2020-02-13 DIAGNOSIS — Q211 Atrial septal defect: Secondary | ICD-10-CM | POA: Diagnosis not present

## 2020-02-13 DIAGNOSIS — Z95818 Presence of other cardiac implants and grafts: Secondary | ICD-10-CM | POA: Diagnosis not present

## 2020-02-13 LAB — COMPREHENSIVE METABOLIC PANEL
ALT: 24 IU/L (ref 0–44)
AST: 28 IU/L (ref 0–40)
Albumin/Globulin Ratio: 1.8 (ref 1.2–2.2)
Albumin: 4.3 g/dL (ref 3.8–4.8)
Alkaline Phosphatase: 105 IU/L (ref 39–117)
BUN/Creatinine Ratio: 12 (ref 10–24)
BUN: 13 mg/dL (ref 8–27)
Bilirubin Total: 0.4 mg/dL (ref 0.0–1.2)
CO2: 23 mmol/L (ref 20–29)
Calcium: 9.6 mg/dL (ref 8.6–10.2)
Chloride: 104 mmol/L (ref 96–106)
Creatinine, Ser: 1.12 mg/dL (ref 0.76–1.27)
GFR calc Af Amer: 77 mL/min/{1.73_m2} (ref >59)
GFR calc non Af Amer: 66 mL/min/{1.73_m2} (ref >59)
Globulin, Total: 2.4 g/dL (ref 1.5–4.5)
Glucose: 107 mg/dL — ABNORMAL HIGH (ref 65–99)
Potassium: 4.6 mmol/L (ref 3.5–5.2)
Sodium: 143 mmol/L (ref 134–144)
Total Protein: 6.7 g/dL (ref 6.0–8.5)

## 2020-02-13 LAB — LIPID PANEL
Chol/HDL Ratio: 3.7 ratio (ref 0.0–5.0)
Cholesterol, Total: 163 mg/dL (ref 100–199)
HDL: 44 mg/dL (ref >39)
LDL Chol Calc (NIH): 106 mg/dL — ABNORMAL HIGH (ref 0–99)
Triglycerides: 66 mg/dL (ref 0–149)
VLDL Cholesterol Cal: 13 mg/dL (ref 5–40)

## 2020-02-13 NOTE — Patient Instructions (Signed)
Medication Instructions:  Your physician recommends that you continue on your current medications as directed. Please refer to the Current Medication list given to you today.  *If you need a refill on your cardiac medications before your next appointment, please call your pharmacy*   Lab Work: Your physician recommends that you return for lab work in: TODAY CMP, Lipids If you have labs (blood work) drawn today and your tests are completely normal, you will receive your results only by: . MyChart Message (if you have MyChart) OR . A paper copy in the mail If you have any lab test that is abnormal or we need to change your treatment, we will call you to review the results.   Testing/Procedures: None   Follow-Up: At [facility], you and your health needs are our priority.  As part of our continuing mission to provide you with exceptional heart care, we have created designated Provider Care Teams.  These Care Teams include your primary Cardiologist (physician) and Advanced Practice Providers (APPs -  Physician Assistants and Nurse Practitioners) who all work together to provide you with the care you need, when you need it.  We recommend signing up for the patient portal called "MyChart".  Sign up information is provided on this After Visit Summary.  MyChart is used to connect with patients for Virtual Visits (Telemedicine).  Patients are able to view lab/test results, encounter notes, upcoming appointments, etc.  Non-urgent messages can be sent to your provider as well.   To learn more about what you can do with MyChart, go to [email or web address].    Your next appointment:   1 year(s)  The format for your next appointment:   In Person  Provider:   Brian Munley, MD   Other Instructions   

## 2020-02-13 NOTE — Telephone Encounter (Signed)
Spoke with pt and did not see what pt is talking about and to disregard Pt agrees ./cy

## 2020-02-13 NOTE — Addendum Note (Signed)
Addended by: Cherly Erno, Jonelle Sidle L on: 02/13/2020 01:05 PM   Modules accepted: Orders

## 2020-02-13 NOTE — Telephone Encounter (Signed)
Patient had visit today with Dr Bettina Gavia he has a question about his AVS, it has on there sodium chloride.  Patient states he does not take that medication.  He wants to know what this is about.

## 2020-04-30 ENCOUNTER — Ambulatory Visit: Payer: Medicare Other | Admitting: Dermatology

## 2020-07-13 DIAGNOSIS — M1990 Unspecified osteoarthritis, unspecified site: Secondary | ICD-10-CM | POA: Insufficient documentation

## 2020-07-13 DIAGNOSIS — I319 Disease of pericardium, unspecified: Secondary | ICD-10-CM | POA: Insufficient documentation

## 2020-07-13 DIAGNOSIS — I10 Essential (primary) hypertension: Secondary | ICD-10-CM | POA: Insufficient documentation

## 2020-07-13 DIAGNOSIS — N200 Calculus of kidney: Secondary | ICD-10-CM | POA: Insufficient documentation

## 2020-07-13 DIAGNOSIS — G4730 Sleep apnea, unspecified: Secondary | ICD-10-CM | POA: Insufficient documentation

## 2020-07-13 DIAGNOSIS — I499 Cardiac arrhythmia, unspecified: Secondary | ICD-10-CM | POA: Insufficient documentation

## 2020-07-13 DIAGNOSIS — L570 Actinic keratosis: Secondary | ICD-10-CM | POA: Insufficient documentation

## 2020-07-15 NOTE — Progress Notes (Signed)
Cardiology Office Note:    Date:  07/16/2020   ID:  Mathew Leach, DOB [birth year], MRN [id number]  PCP:  Mathew Shivers, MD  Cardiologist:  Mathew More, MD    Referring MD: Mathew Shivers, MD    ASSESSMENT:    1. PAF (paroxysmal atrial fibrillation) ([place])   2. Presence of Watchman left atrial appendage closure device   3. PFO (patent foramen ovale)   4. Hypertensive heart disease without heart failure   5. Hyperlipidemia, unspecified hyperlipidemia type    PLAN:    In order of problems listed above:  1. He has done well with his atrial fibrillation no clinical recurrence and watchman device in place with antiplatelet therapy with aspirin. 2. I reviewed records his PFO was quite small and no indication for closure 3. Stable BP at target continue combination ACE antihypertensive amlodipine 4. Lipids at target continue statin   Next appointment: 1 year   Medication Adjustments/Labs and Tests Ordered: Current medicines are reviewed at length with the patient today.  Concerns regarding medicines are outlined above.  No orders of the defined types were placed in this encounter.  No orders of the defined types were placed in this encounter.   No chief complaint on file.   History of Present Illness:    Mathew Leach is a 69 y.o. male with a hx of stroke PFO with right-to-left shunt hypertension dyslipidemia and left atrial occlusion watchman device [facility] 2020 he had a TEE that that time that showed a low risk small patent foramen ovale.  He was last seen 02/13/2020.  The driver for his watchman device was an exclusion from [place] licensure. Compliance with diet, lifestyle and medications: Yes  He continues to home male between [place] and [place].  He has had no neurologic symptoms shortness of breath chest pain palpitation or syncope.  After watchman device he takes low-dose aspirin.  Most recent labs performed March showed lipids at target  cholesterol 163 LDL 94 HDL 44 normal creatinine. Past Medical History:  Diagnosis Date  . Actinic keratosis    scalp  . Arthritis   . ASD (atrial septal defect) 06/10/2015  . Atrial flutter, paroxysmal ([place]) 08/01/2015  . Chronic anticoagulation 08/29/2015  . Chronic pain of right knee 12/07/2014  . Dysplastic nevus 01/11/2015   upper back spinal  . Dysplastic nevus 01/29/2017   right superior calf inferior to popliteal  . Dysrhythmia    AFIB  . Hypertension   . Kidney stones   . Obstructive sleep apnea syndrome 06/26/2015  . PAF (paroxysmal atrial fibrillation) ([place]) 07/30/2015  . Pericarditis    1999  . PFO (patent foramen ovale) 06/16/2018  . Preoperative cardiovascular examination 02/03/2016  . 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 Shortness of breath dyspnea    W/ EXERTION   . Sleep apnea    CPAP  . Stroke [facility])    04-2015    Past Surgical History:  Procedure Laterality Date  . COLONOSCOPY  04/10/2011   Moderate predominantly sigmoid diverticulosis. Small internal hemorrhoids.   . COLONOSCOPY  01/30/2020  . CYST REMOVAL NECK     AGE 46  . ESOPHAGOGASTRODUODENOSCOPY  04/10/2011   Erosive esophagitis with esophageal stricture (asymptomatic strictures since the patient not having any dysphagia). LA grade D esophagitis.  . INGUINAL HERNIA REPAIR     LEFT AS CHILD  . TOTAL KNEE ARTHROPLASTY Right 03/17/2016   Procedure: TOTAL KNEE ARTHROPLASTY;  Surgeon: Mathew Huger, MD;  Location: [place];  Service: Orthopedics;  Laterality: Right;  . watchman procedure  07/2019    Current Medications: Current Meds  Medication Sig  . amLODipine-benazepril (LOTREL) 10-20 MG capsule Take 1 capsule by mouth daily.  . ASPIRIN 81 PO Take 1 tablet by mouth daily.  . folic acid (FOLVITE) 1 MG tablet Take 1 mg by mouth daily.   . Omega-3 Fatty Acids (FISH OIL) 1000 MG CAPS Take 1 capsule by mouth daily.  . simvastatin (ZOCOR) 20 MG tablet Take 20 mg by mouth daily.  .  Vitamin D, Cholecalciferol, 50 MCG (2000 UT) CAPS Take 1 tablet by mouth daily.   Current Facility-Administered Medications for the 07/16/20 encounter (Office Visit) with [name], MD  Medication  . 0.9 %  sodium chloride infusion     Allergies:   Lipitor [atorvastatin]   Social History   Socioeconomic History  . Marital status: Married    Spouse name: Not on file  . Number of children: Not on file  . Years of education: Not on file  . Highest education level: Not on file  Occupational History  . Not on file  Tobacco Use  . Smoking status: Never Smoker  . Smokeless tobacco: Never Used  Vaping Use  . Vaping Use: Never used  Substance and Sexual Activity  . Alcohol use: No  . Drug use: No  . Sexual activity: Not on file  Other Topics Concern  . Not on file  Social History Narrative  . Not on file   Social Determinants of Health   Financial Resource Strain:   . Difficulty of Paying Living Expenses: Not on file  Food Insecurity:   . Worried About Charity fundraiser in the Last Year: Not on file  . Ran Out of Food in the Last Year: Not on file  Transportation Needs:   . Lack of Transportation (Medical): Not on file  . Lack of Transportation (Non-Medical): Not on file  Physical Activity:   . Days of Exercise per Week: Not on file  . Minutes of Exercise per Session: Not on file  Stress:   . Feeling of Stress : Not on file  Social Connections:   . Frequency of Communication with Friends and Family: Not on file  . Frequency of Social Gatherings with Friends and Family: Not on file  . Attends Religious Services: Not on file  . Active Member of Clubs or Organizations: Not on file  . Attends Archivist Meetings: Not on file  . Marital Status: Not on file     Family History: The patient's family history includes Lung cancer in his father. There is no history of Colon cancer, Esophageal cancer, Colon polyps, Rectal cancer, or Stomach cancer. ROS:     Please see the history of present illness.    All other systems reviewed and are negative.  EKGs/Labs/Other Studies Reviewed:    The following studies were reviewed today:  EKG:  EKG ordered today and personally reviewed.  The ekg ordered today demonstrates sinus rhythm first-degree AV block otherwise normal EKG  Recent Labs: 01/12/2020: Hemoglobin 13.5; Platelets 297.0 02/13/2020: ALT 24; BUN 13; Creatinine, Ser 1.12; Potassium 4.6; Sodium 143  Recent Lipid Panel    Component Value Date/Time   CHOL 163 02/13/2020 1037   TRIG 66 02/13/2020 1037   HDL 44 02/13/2020 1037   CHOLHDL 3.7 02/13/2020 1037   LDLCALC 106 (H) 02/13/2020 1037    Physical Exam:    VS:  BP 108/60   Pulse (!) 58   Ht 5\' 8"  (1.727 m)   Wt 184 lb (83.5 kg)   SpO2 98%   BMI 27.98 kg/m     Wt Readings from Last 3 Encounters:  07/16/20 184 lb (83.5 kg)  02/13/20 185 lb 3.2 oz (84 kg)  01/30/20 185 lb (83.9 kg)     GEN:  Well nourished, well developed in no acute distress HEENT: Normal NECK: No JVD; No carotid bruits LYMPHATICS: No lymphadenopathy CARDIAC: RRR, no murmurs, rubs, gallops RESPIRATORY:  Clear to auscultation without rales, wheezing or rhonchi  ABDOMEN: Soft, non-tender, non-distended MUSCULOSKELETAL:  No edema; No deformity  SKIN: Warm and dry NEUROLOGIC:  Alert and oriented x 3 PSYCHIATRIC:  Normal affect    Signed, Mathew More, MD  07/16/2020 10:16 AM    [place]

## 2020-07-16 ENCOUNTER — Encounter: Payer: Self-pay | Admitting: Cardiology

## 2020-07-16 ENCOUNTER — Other Ambulatory Visit: Payer: Self-pay

## 2020-07-16 ENCOUNTER — Ambulatory Visit (INDEPENDENT_AMBULATORY_CARE_PROVIDER_SITE_OTHER): Payer: Medicare Other | Admitting: Cardiology

## 2020-07-16 VITALS — BP 108/60 | HR 58 | Ht 68.0 in | Wt 184.0 lb

## 2020-07-16 DIAGNOSIS — Q211 Atrial septal defect: Secondary | ICD-10-CM

## 2020-07-16 DIAGNOSIS — I480 Paroxysmal atrial fibrillation: Secondary | ICD-10-CM | POA: Diagnosis not present

## 2020-07-16 DIAGNOSIS — E785 Hyperlipidemia, unspecified: Secondary | ICD-10-CM

## 2020-07-16 DIAGNOSIS — Z95818 Presence of other cardiac implants and grafts: Secondary | ICD-10-CM

## 2020-07-16 DIAGNOSIS — I119 Hypertensive heart disease without heart failure: Secondary | ICD-10-CM | POA: Diagnosis not present

## 2020-07-16 DIAGNOSIS — Q2112 Patent foramen ovale: Secondary | ICD-10-CM

## 2020-07-16 MED ORDER — SIMVASTATIN 20 MG PO TABS: 20.0000 mg | ORAL_TABLET | Freq: Every day | ORAL | 3 refills | Status: AC

## 2020-07-16 MED ORDER — AMLODIPINE BESY-BENAZEPRIL HCL 10-20 MG PO CAPS: 1.0000 | ORAL_CAPSULE | Freq: Every day | ORAL | 3 refills | Status: DC

## 2020-07-16 NOTE — Patient Instructions (Signed)

## 2020-07-24 DIAGNOSIS — E782 Mixed hyperlipidemia: Secondary | ICD-10-CM | POA: Diagnosis not present

## 2020-07-24 DIAGNOSIS — I4891 Unspecified atrial fibrillation: Secondary | ICD-10-CM | POA: Diagnosis not present

## 2020-07-24 DIAGNOSIS — Z1331 Encounter for screening for depression: Secondary | ICD-10-CM | POA: Diagnosis not present

## 2020-07-24 DIAGNOSIS — Z0000 Encounter for general adult medical examination without abnormal findings: Secondary | ICD-10-CM | POA: Diagnosis not present

## 2020-07-24 DIAGNOSIS — Z136 Encounter for screening for cardiovascular disorders: Secondary | ICD-10-CM | POA: Diagnosis not present

## 2020-07-24 DIAGNOSIS — Z1339 Encounter for screening examination for other mental health and behavioral disorders: Secondary | ICD-10-CM | POA: Diagnosis not present

## 2020-07-24 DIAGNOSIS — Z125 Encounter for screening for malignant neoplasm of prostate: Secondary | ICD-10-CM | POA: Diagnosis not present

## 2020-07-24 DIAGNOSIS — Z139 Encounter for screening, unspecified: Secondary | ICD-10-CM | POA: Diagnosis not present

## 2020-07-24 DIAGNOSIS — R7303 Prediabetes: Secondary | ICD-10-CM | POA: Diagnosis not present

## 2020-08-15 DIAGNOSIS — I10 Essential (primary) hypertension: Secondary | ICD-10-CM | POA: Diagnosis not present

## 2020-08-15 DIAGNOSIS — I482 Chronic atrial fibrillation, unspecified: Secondary | ICD-10-CM | POA: Diagnosis not present

## 2020-08-15 DIAGNOSIS — E782 Mixed hyperlipidemia: Secondary | ICD-10-CM | POA: Diagnosis not present

## 2020-08-15 DIAGNOSIS — R7303 Prediabetes: Secondary | ICD-10-CM | POA: Diagnosis not present

## 2020-09-12 ENCOUNTER — Inpatient Hospital Stay (HOSPITAL_COMMUNITY)
Admission: EM | Admit: 2020-09-12 | Discharge: 2020-09-23 | DRG: 025 | Disposition: A | Discharge status: Home Health | Payer: Medicare Other | Attending: Internal Medicine | Admitting: Internal Medicine

## 2020-09-12 ENCOUNTER — Encounter (HOSPITAL_COMMUNITY): Payer: Self-pay

## 2020-09-12 ENCOUNTER — Emergency Department (HOSPITAL_COMMUNITY): Payer: Medicare Other

## 2020-09-12 DIAGNOSIS — G939 Disorder of brain, unspecified: Secondary | ICD-10-CM

## 2020-09-12 DIAGNOSIS — G9389 Other specified disorders of brain: Secondary | ICD-10-CM

## 2020-09-12 DIAGNOSIS — Z8673 Personal history of transient ischemic attack (TIA), and cerebral infarction without residual deficits: Secondary | ICD-10-CM

## 2020-09-12 DIAGNOSIS — N281 Cyst of kidney, acquired: Secondary | ICD-10-CM | POA: Diagnosis present

## 2020-09-12 DIAGNOSIS — N179 Acute kidney failure, unspecified: Secondary | ICD-10-CM | POA: Diagnosis not present

## 2020-09-12 DIAGNOSIS — G8929 Other chronic pain: Secondary | ICD-10-CM | POA: Diagnosis present

## 2020-09-12 DIAGNOSIS — H9190 Unspecified hearing loss, unspecified ear: Secondary | ICD-10-CM | POA: Diagnosis present

## 2020-09-12 DIAGNOSIS — Z20822 Contact with and (suspected) exposure to covid-19: Secondary | ICD-10-CM | POA: Diagnosis present

## 2020-09-12 DIAGNOSIS — R569 Unspecified convulsions: Secondary | ICD-10-CM | POA: Diagnosis not present

## 2020-09-12 DIAGNOSIS — E872 Acidosis, unspecified: Secondary | ICD-10-CM

## 2020-09-12 DIAGNOSIS — C711 Malignant neoplasm of frontal lobe: Secondary | ICD-10-CM | POA: Diagnosis not present

## 2020-09-12 DIAGNOSIS — I480 Paroxysmal atrial fibrillation: Secondary | ICD-10-CM | POA: Diagnosis present

## 2020-09-12 DIAGNOSIS — C719 Malignant neoplasm of brain, unspecified: Secondary | ICD-10-CM

## 2020-09-12 DIAGNOSIS — S199XXA Unspecified injury of neck, initial encounter: Secondary | ICD-10-CM | POA: Diagnosis not present

## 2020-09-12 DIAGNOSIS — I10 Essential (primary) hypertension: Secondary | ICD-10-CM | POA: Diagnosis present

## 2020-09-12 DIAGNOSIS — R000 Tachycardia, unspecified: Secondary | ICD-10-CM | POA: Diagnosis not present

## 2020-09-12 DIAGNOSIS — G936 Cerebral edema: Secondary | ICD-10-CM | POA: Diagnosis present

## 2020-09-12 DIAGNOSIS — Z96651 Presence of right artificial knee joint: Secondary | ICD-10-CM | POA: Diagnosis present

## 2020-09-12 DIAGNOSIS — D72829 Elevated white blood cell count, unspecified: Secondary | ICD-10-CM | POA: Diagnosis not present

## 2020-09-12 DIAGNOSIS — Z87442 Personal history of urinary calculi: Secondary | ICD-10-CM

## 2020-09-12 DIAGNOSIS — Q211 Atrial septal defect: Secondary | ICD-10-CM | POA: Diagnosis not present

## 2020-09-12 DIAGNOSIS — Z8719 Personal history of other diseases of the digestive system: Secondary | ICD-10-CM

## 2020-09-12 DIAGNOSIS — R4020 Unspecified coma: Secondary | ICD-10-CM | POA: Diagnosis not present

## 2020-09-12 DIAGNOSIS — L570 Actinic keratosis: Secondary | ICD-10-CM | POA: Diagnosis present

## 2020-09-12 DIAGNOSIS — M1990 Unspecified osteoarthritis, unspecified site: Secondary | ICD-10-CM | POA: Diagnosis present

## 2020-09-12 DIAGNOSIS — Z95818 Presence of other cardiac implants and grafts: Secondary | ICD-10-CM

## 2020-09-12 DIAGNOSIS — G4733 Obstructive sleep apnea (adult) (pediatric): Secondary | ICD-10-CM | POA: Diagnosis present

## 2020-09-12 DIAGNOSIS — Z7189 Other specified counseling: Secondary | ICD-10-CM

## 2020-09-12 DIAGNOSIS — G40409 Other generalized epilepsy and epileptic syndromes, not intractable, without status epilepticus: Secondary | ICD-10-CM | POA: Diagnosis present

## 2020-09-12 DIAGNOSIS — E785 Hyperlipidemia, unspecified: Secondary | ICD-10-CM | POA: Diagnosis present

## 2020-09-12 DIAGNOSIS — Z7982 Long term (current) use of aspirin: Secondary | ICD-10-CM

## 2020-09-12 DIAGNOSIS — N2889 Other specified disorders of kidney and ureter: Secondary | ICD-10-CM | POA: Diagnosis present

## 2020-09-12 DIAGNOSIS — E119 Type 2 diabetes mellitus without complications: Secondary | ICD-10-CM | POA: Diagnosis present

## 2020-09-12 DIAGNOSIS — Z79899 Other long term (current) drug therapy: Secondary | ICD-10-CM

## 2020-09-12 DIAGNOSIS — R5383 Other fatigue: Secondary | ICD-10-CM | POA: Diagnosis not present

## 2020-09-12 DIAGNOSIS — Z888 Allergy status to other drugs, medicaments and biological substances status: Secondary | ICD-10-CM

## 2020-09-12 DIAGNOSIS — R404 Transient alteration of awareness: Secondary | ICD-10-CM | POA: Diagnosis not present

## 2020-09-12 LAB — CBC WITH DIFFERENTIAL/PLATELET
Abs Immature Granulocytes: 0.13 10*3/uL — ABNORMAL HIGH (ref 0.00–0.07)
Basophils Absolute: 0.1 10*3/uL (ref 0.0–0.1)
Basophils Relative: 1 %
Eosinophils Absolute: 0.3 10*3/uL (ref 0.0–0.5)
Eosinophils Relative: 4 %
HCT: 46.6 % (ref 39.0–52.0)
Hemoglobin: 14.8 g/dL (ref 13.0–17.0)
Immature Granulocytes: 1 %
Lymphocytes Relative: 36 %
Lymphs Abs: 3.3 10*3/uL (ref 0.7–4.0)
MCH: 30.1 pg (ref 26.0–34.0)
MCHC: 31.8 g/dL (ref 30.0–36.0)
MCV: 94.7 fL (ref 80.0–100.0)
Monocytes Absolute: 0.7 10*3/uL (ref 0.1–1.0)
Monocytes Relative: 8 %
Neutro Abs: 4.5 10*3/uL (ref 1.7–7.7)
Neutrophils Relative %: 50 %
Platelets: 323 10*3/uL (ref 150–400)
RBC: 4.92 MIL/uL (ref 4.22–5.81)
RDW: 12.9 % (ref 11.5–15.5)
WBC: 9 10*3/uL (ref 4.0–10.5)
nRBC: 0 % (ref 0.0–0.2)

## 2020-09-12 LAB — COMPREHENSIVE METABOLIC PANEL
ALT: 20 U/L (ref 0–44)
AST: 32 U/L (ref 15–41)
Albumin: 4 g/dL (ref 3.5–5.0)
Alkaline Phosphatase: 70 U/L (ref 38–126)
Anion gap: 23 — ABNORMAL HIGH (ref 5–15)
BUN: 16 mg/dL (ref 8–23)
CO2: 12 mmol/L — ABNORMAL LOW (ref 22–32)
Calcium: 9.5 mg/dL (ref 8.9–10.3)
Chloride: 107 mmol/L (ref 98–111)
Creatinine, Ser: 1.67 mg/dL — ABNORMAL HIGH (ref 0.61–1.24)
GFR, Estimated: 43 mL/min — ABNORMAL LOW (ref >60)
Glucose, Bld: 141 mg/dL — ABNORMAL HIGH (ref 70–99)
Potassium: 4.1 mmol/L (ref 3.5–5.1)
Sodium: 142 mmol/L (ref 135–145)
Total Bilirubin: 0.9 mg/dL (ref 0.3–1.2)
Total Protein: 6.8 g/dL (ref 6.5–8.1)

## 2020-09-12 LAB — PROTIME-INR
INR: 1.1 (ref 0.8–1.2)
Prothrombin Time: 14.1 seconds (ref 11.4–15.2)

## 2020-09-12 LAB — RAPID URINE DRUG SCREEN, HOSP PERFORMED
Amphetamines: NOT DETECTED
Barbiturates: NOT DETECTED
Benzodiazepines: POSITIVE — AB
Cocaine: NOT DETECTED
Opiates: NOT DETECTED
Tetrahydrocannabinol: NOT DETECTED

## 2020-09-12 LAB — CBG MONITORING, ED: Glucose-Capillary: 132 mg/dL — ABNORMAL HIGH (ref 70–99)

## 2020-09-12 LAB — APTT: aPTT: 20 seconds — ABNORMAL LOW (ref 24–36)

## 2020-09-12 LAB — URINALYSIS, ROUTINE W REFLEX MICROSCOPIC
Bilirubin Urine: NEGATIVE
Glucose, UA: NEGATIVE mg/dL
Hgb urine dipstick: NEGATIVE
Ketones, ur: 5 mg/dL — AB
Leukocytes,Ua: NEGATIVE
Nitrite: NEGATIVE
Protein, ur: NEGATIVE mg/dL
Specific Gravity, Urine: 1.015 (ref 1.005–1.030)
pH: 5 (ref 5.0–8.0)

## 2020-09-12 LAB — RESPIRATORY PANEL BY RT PCR (FLU A&B, COVID)
Influenza A by PCR: NEGATIVE
Influenza B by PCR: NEGATIVE
SARS Coronavirus 2 by RT PCR: NEGATIVE

## 2020-09-12 LAB — ETHANOL: Alcohol, Ethyl (B): <10 mg/dL (ref <10)

## 2020-09-12 MED ORDER — DEXAMETHASONE SODIUM PHOSPHATE 10 MG/ML IJ SOLN
10.0000 mg | Freq: Once | INTRAMUSCULAR | Status: AC
  Administered 2020-09-12: 10 mg via INTRAVENOUS
  Filled 2020-09-12: qty 1

## 2020-09-12 MED ORDER — ACETAMINOPHEN 650 MG RE SUPP: 650.0000 mg | Freq: Four times a day (QID) | RECTAL | Status: DC | PRN

## 2020-09-12 MED ORDER — ASPIRIN EC 81 MG PO TBEC: 81.0000 mg | DELAYED_RELEASE_TABLET | Freq: Every day | ORAL | Status: DC

## 2020-09-12 MED ORDER — DEXAMETHASONE SODIUM PHOSPHATE 4 MG/ML IJ SOLN
4.0000 mg | Freq: Four times a day (QID) | INTRAMUSCULAR | Status: DC
  Administered 2020-09-13 – 2020-09-22 (×36): 4 mg via INTRAVENOUS
  Filled 2020-09-12 (×37): qty 1

## 2020-09-12 MED ORDER — SIMVASTATIN 20 MG PO TABS
20.0000 mg | ORAL_TABLET | Freq: Every day | ORAL | Status: DC
  Administered 2020-09-13 – 2020-09-22 (×9): 20 mg via ORAL
  Filled 2020-09-12 (×9): qty 1

## 2020-09-12 MED ORDER — HEPARIN SODIUM (PORCINE) 5000 UNIT/ML IJ SOLN: 5000.0000 [IU] | Freq: Three times a day (TID) | INTRAMUSCULAR | Status: DC

## 2020-09-12 MED ORDER — SODIUM CHLORIDE 0.9 % IV BOLUS
500.0000 mL | Freq: Once | INTRAVENOUS | Status: AC
  Administered 2020-09-12: 500 mL via INTRAVENOUS

## 2020-09-12 MED ORDER — LEVETIRACETAM 500 MG PO TABS
500.0000 mg | ORAL_TABLET | Freq: Two times a day (BID) | ORAL | Status: DC
  Administered 2020-09-13: 500 mg via ORAL
  Filled 2020-09-12: qty 1

## 2020-09-12 MED ORDER — AMLODIPINE BESYLATE 10 MG PO TABS
10.0000 mg | ORAL_TABLET | Freq: Every day | ORAL | Status: DC
  Administered 2020-09-13 – 2020-09-23 (×10): 10 mg via ORAL
  Filled 2020-09-12 (×6): qty 1
  Filled 2020-09-12: qty 2
  Filled 2020-09-12 (×3): qty 1

## 2020-09-12 MED ORDER — LEVETIRACETAM IN NACL 1000 MG/100ML IV SOLN
1000.0000 mg | Freq: Once | INTRAVENOUS | Status: AC
  Administered 2020-09-12: 1000 mg via INTRAVENOUS
  Filled 2020-09-12: qty 100

## 2020-09-12 MED ORDER — SODIUM CHLORIDE 0.9 % IV SOLN
100.0000 mL/h | INTRAVENOUS | Status: DC
  Administered 2020-09-12 – 2020-09-19 (×7): 100 mL/h via INTRAVENOUS

## 2020-09-12 MED ORDER — ACETAMINOPHEN 325 MG PO TABS
650.0000 mg | ORAL_TABLET | Freq: Four times a day (QID) | ORAL | Status: DC | PRN
  Administered 2020-09-19 – 2020-09-23 (×3): 650 mg via ORAL
  Filled 2020-09-12 (×3): qty 2
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
## 2020-09-12 NOTE — ED Notes (Signed)
Pt's CBG result was 132. Informed Annie Main - RN.
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
## 2020-09-12 NOTE — ED Notes (Signed)
Patient transported to CT 
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
## 2020-09-13 ENCOUNTER — Encounter (HOSPITAL_COMMUNITY): Payer: Self-pay | Admitting: Internal Medicine

## 2020-09-13 ENCOUNTER — Observation Stay (HOSPITAL_COMMUNITY): Payer: Medicare Other

## 2020-09-13 ENCOUNTER — Inpatient Hospital Stay (HOSPITAL_COMMUNITY): Payer: Medicare Other

## 2020-09-13 DIAGNOSIS — Z20822 Contact with and (suspected) exposure to covid-19: Secondary | ICD-10-CM | POA: Diagnosis not present

## 2020-09-13 DIAGNOSIS — Z8673 Personal history of transient ischemic attack (TIA), and cerebral infarction without residual deficits: Secondary | ICD-10-CM | POA: Diagnosis not present

## 2020-09-13 DIAGNOSIS — I480 Paroxysmal atrial fibrillation: Secondary | ICD-10-CM | POA: Diagnosis not present

## 2020-09-13 DIAGNOSIS — Z87442 Personal history of urinary calculi: Secondary | ICD-10-CM | POA: Diagnosis not present

## 2020-09-13 DIAGNOSIS — G939 Disorder of brain, unspecified: Secondary | ICD-10-CM

## 2020-09-13 DIAGNOSIS — K5730 Diverticulosis of large intestine without perforation or abscess without bleeding: Secondary | ICD-10-CM | POA: Diagnosis not present

## 2020-09-13 DIAGNOSIS — G9389 Other specified disorders of brain: Secondary | ICD-10-CM | POA: Diagnosis not present

## 2020-09-13 DIAGNOSIS — H9190 Unspecified hearing loss, unspecified ear: Secondary | ICD-10-CM | POA: Diagnosis present

## 2020-09-13 DIAGNOSIS — L570 Actinic keratosis: Secondary | ICD-10-CM | POA: Diagnosis present

## 2020-09-13 DIAGNOSIS — R569 Unspecified convulsions: Secondary | ICD-10-CM | POA: Diagnosis not present

## 2020-09-13 DIAGNOSIS — N179 Acute kidney failure, unspecified: Secondary | ICD-10-CM | POA: Diagnosis not present

## 2020-09-13 DIAGNOSIS — I639 Cerebral infarction, unspecified: Secondary | ICD-10-CM | POA: Diagnosis not present

## 2020-09-13 DIAGNOSIS — C711 Malignant neoplasm of frontal lobe: Secondary | ICD-10-CM | POA: Diagnosis not present

## 2020-09-13 DIAGNOSIS — D72829 Elevated white blood cell count, unspecified: Secondary | ICD-10-CM | POA: Diagnosis not present

## 2020-09-13 DIAGNOSIS — G936 Cerebral edema: Secondary | ICD-10-CM | POA: Diagnosis not present

## 2020-09-13 DIAGNOSIS — C729 Malignant neoplasm of central nervous system, unspecified: Secondary | ICD-10-CM | POA: Diagnosis not present

## 2020-09-13 DIAGNOSIS — I6930 Unspecified sequelae of cerebral infarction: Secondary | ICD-10-CM | POA: Diagnosis not present

## 2020-09-13 DIAGNOSIS — E785 Hyperlipidemia, unspecified: Secondary | ICD-10-CM | POA: Diagnosis present

## 2020-09-13 DIAGNOSIS — N2889 Other specified disorders of kidney and ureter: Secondary | ICD-10-CM | POA: Diagnosis not present

## 2020-09-13 DIAGNOSIS — I10 Essential (primary) hypertension: Secondary | ICD-10-CM | POA: Diagnosis not present

## 2020-09-13 DIAGNOSIS — Z8719 Personal history of other diseases of the digestive system: Secondary | ICD-10-CM | POA: Diagnosis not present

## 2020-09-13 DIAGNOSIS — E119 Type 2 diabetes mellitus without complications: Secondary | ICD-10-CM | POA: Diagnosis present

## 2020-09-13 DIAGNOSIS — G40409 Other generalized epilepsy and epileptic syndromes, not intractable, without status epilepticus: Secondary | ICD-10-CM | POA: Diagnosis present

## 2020-09-13 DIAGNOSIS — D430 Neoplasm of uncertain behavior of brain, supratentorial: Secondary | ICD-10-CM | POA: Diagnosis not present

## 2020-09-13 DIAGNOSIS — Z96651 Presence of right artificial knee joint: Secondary | ICD-10-CM | POA: Diagnosis present

## 2020-09-13 DIAGNOSIS — D496 Neoplasm of unspecified behavior of brain: Secondary | ICD-10-CM | POA: Diagnosis not present

## 2020-09-13 DIAGNOSIS — N281 Cyst of kidney, acquired: Secondary | ICD-10-CM | POA: Diagnosis not present

## 2020-09-13 DIAGNOSIS — K449 Diaphragmatic hernia without obstruction or gangrene: Secondary | ICD-10-CM | POA: Diagnosis not present

## 2020-09-13 DIAGNOSIS — Z95818 Presence of other cardiac implants and grafts: Secondary | ICD-10-CM | POA: Diagnosis not present

## 2020-09-13 DIAGNOSIS — G8929 Other chronic pain: Secondary | ICD-10-CM | POA: Diagnosis present

## 2020-09-13 DIAGNOSIS — E872 Acidosis: Secondary | ICD-10-CM | POA: Diagnosis not present

## 2020-09-13 DIAGNOSIS — Q211 Atrial septal defect: Secondary | ICD-10-CM | POA: Diagnosis not present

## 2020-09-13 DIAGNOSIS — M1990 Unspecified osteoarthritis, unspecified site: Secondary | ICD-10-CM | POA: Diagnosis present

## 2020-09-13 DIAGNOSIS — G4733 Obstructive sleep apnea (adult) (pediatric): Secondary | ICD-10-CM | POA: Diagnosis present

## 2020-09-13 LAB — BASIC METABOLIC PANEL
Anion gap: 9 (ref 5–15)
BUN: 15 mg/dL (ref 8–23)
CO2: 22 mmol/L (ref 22–32)
Calcium: 9 mg/dL (ref 8.9–10.3)
Chloride: 111 mmol/L (ref 98–111)
Creatinine, Ser: 1.18 mg/dL (ref 0.61–1.24)
GFR, Estimated: >60 mL/min (ref >60)
Glucose, Bld: 180 mg/dL — ABNORMAL HIGH (ref 70–99)
Potassium: 4.2 mmol/L (ref 3.5–5.1)
Sodium: 142 mmol/L (ref 135–145)

## 2020-09-13 LAB — GLUCOSE, CAPILLARY
Glucose-Capillary: 126 mg/dL — ABNORMAL HIGH (ref 70–99)
Glucose-Capillary: 141 mg/dL — ABNORMAL HIGH (ref 70–99)

## 2020-09-13 LAB — CBG MONITORING, ED: Glucose-Capillary: 143 mg/dL — ABNORMAL HIGH (ref 70–99)

## 2020-09-13 LAB — LACTIC ACID, PLASMA: Lactic Acid, Venous: 1.6 mmol/L (ref 0.5–1.9)

## 2020-09-13 MED ORDER — INSULIN ASPART 100 UNIT/ML ~~LOC~~ SOLN
0.0000 [IU] | Freq: Every day | SUBCUTANEOUS | Status: DC
  Administered 2020-09-18: 2 [IU] via SUBCUTANEOUS

## 2020-09-13 MED ORDER — LEVETIRACETAM 500 MG PO TABS
1000.0000 mg | ORAL_TABLET | Freq: Two times a day (BID) | ORAL | Status: DC
  Administered 2020-09-13 – 2020-09-23 (×19): 1000 mg via ORAL
  Filled 2020-09-13 (×20): qty 2

## 2020-09-13 MED ORDER — LEVETIRACETAM IN NACL 1000 MG/100ML IV SOLN
1000.0000 mg | Freq: Once | INTRAVENOUS | Status: AC
  Administered 2020-09-13: 1000 mg via INTRAVENOUS
  Filled 2020-09-13: qty 100

## 2020-09-13 MED ORDER — INSULIN ASPART 100 UNIT/ML ~~LOC~~ SOLN
0.0000 [IU] | Freq: Three times a day (TID) | SUBCUTANEOUS | Status: DC
  Administered 2020-09-13 – 2020-09-17 (×7): 2 [IU] via SUBCUTANEOUS
  Administered 2020-09-17 – 2020-09-19 (×2): 3 [IU] via SUBCUTANEOUS
  Administered 2020-09-19 – 2020-09-20 (×3): 2 [IU] via SUBCUTANEOUS
  Administered 2020-09-20: 3 [IU] via SUBCUTANEOUS
  Administered 2020-09-21 – 2020-09-22 (×4): 2 [IU] via SUBCUTANEOUS
  Administered 2020-09-22: 3 [IU] via SUBCUTANEOUS
  Administered 2020-09-23: 2 [IU] via SUBCUTANEOUS

## 2020-09-13 MED ORDER — GADOBUTROL 1 MMOL/ML IV SOLN
10.0000 mL | Freq: Once | INTRAVENOUS | Status: AC | PRN
  Administered 2020-09-13: 10 mL via INTRAVENOUS

## 2020-09-13 MED ORDER — IOHEXOL 300 MG/ML  SOLN
100.0000 mL | Freq: Once | INTRAMUSCULAR | Status: AC | PRN
  Administered 2020-09-13: 100 mL via INTRAVENOUS

## 2020-09-13 NOTE — Progress Notes (Signed)
No further seizures since admission.  He continues to have mild right-sided weakness, but is awake, alert, interactive.  He is being evaluated by neurosurgery for his mass.  He has slow periodic epileptiform discharges, though there is one breif episode where there may be some evolution in amplitude. I would favor increasing keppra dose.   Will follow.   Roland Rack, MD [facility] [phone]  If 7pm- 7am, please page neurology on call as listed in [place].

## 2020-09-13 NOTE — Progress Notes (Signed)
Was notified by NT Mimi that patient has vomited. Pt was assessed. Vomit was dark brown-redish and clot like in nature. Abd is soft/round, no complaints of pain. Hospitalist Dr. Reesa Chew notified. Patient is soon to go down for CT of abd.

## 2020-09-13 NOTE — Progress Notes (Signed)
EEG completed, results pending. 

## 2020-09-13 NOTE — ED Notes (Signed)
Pt in MRI.

## 2020-09-13 NOTE — ED Notes (Signed)
Pt to 3W 

## 2020-09-13 NOTE — ED Notes (Signed)
EEG at bedside.

## 2020-09-13 NOTE — ED Notes (Signed)
Report to Z RN

## 2020-09-13 NOTE — ED Notes (Signed)
Pt resting in bed. NADN 
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
## 2020-09-13 NOTE — Procedures (Addendum)
Patient Name: Mathew Leach  MRN: [id number]  Epilepsy Attending: Lora Havens  Referring Physician/Provider: Dr Derrick Ravel Date: 09/13/2020 Duration: 23.29 mins  Patient history: y.o. 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 EEG to evaluate for seizure  Level of alertness: Awake, asleep  AEDs during EEG study: LEV  Technical aspects: This EEG study was done with scalp electrodes positioned according to the 10-20 International system of electrode placement. Electrical activity was acquired at a sampling rate of 500Hz  and reviewed with a high frequency filter of 70Hz  and a low frequency filter of 1Hz . EEG data were recorded continuously and digitally stored.   Description: The posterior dominant rhythm consists of 9 Hz activity of moderate voltage (25-35 uV) seen predominantly in posterior head regions, asymmetric ( L<R) and reactive to eye opening and eye closing. Sleep was characterized by vertex waves, sleep spindles (12 to 14 Hz), maximal frontocentral region.  EEG showed continuous 3 to 5 Hz theta-delta slowing in left hemisphere, maximal left frontal region.  Sharp waves were also noted in left frontal region. 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 Physiologic photic driving was not seen during photic stimulation.  Hyperventilation was not performed.     ABNORMALITY -Periodic epileptiform discharges, left frontal region -Sharp waves, left frontal region -Continuous slow, left hemisphere, maximal left frontal region  IMPRESSION: This study showed evidence of epileptogenicity and cortical dysfunction in left frontal region likely secondary to underlying mass.  No seizures were seen throughout the recording.  [name]

## 2020-09-13 NOTE — ED Notes (Signed)
Patient transported to MRI 

## 2020-09-13 NOTE — Consult Note (Addendum)
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 stimulation.  Hyperventilation was not performed.   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  No seizures were seen throughout the recording. Priyanka O Yadav   Assessment and Plan:  1.  Left frontal brain mass 2.  Seizure secondary to #1 3.  AKI 4.  Hypertension 5.  Paroxysmal atrial fibrillation 6.  Hyperlipidemia  -MRI results were discussed with the patient and his wife.  We discussed that the mass in his brain is concerning for malignancy.  Unclear if this mass is a primary brain tumor versus metastatic deposit.  Discussed that we need additional information including a CT of the chest/abdomen/pelvis to further evaluate for other potential sites for malignancy.  CT scan ordered and currently pending. -Awaiting final review of MRI of the  brain from neurosurgery.  Continue dexamethasone and Keppra. -Continue IV hydration for AKI.  Monitor renal function closely. -Management of chronic medical conditions per hospitalist.  Thank you for this referral.   Mikey Bussing, DNP, AGPCNP-BC, AOCNP   ADDENDUM: I saw and examined Mathew Leach.  He is a true [employer] hero.  He was in the [employer].  I thanked him for his service.  It looks like he has a fairly large left frontal mass.  This is necrotic.  It measures 4.8 x 4.7 x 4.4 cm.  There is a satellite lesion which is along the left lateral ventricle.  This measures 5 mm.  There is some mass-effect.  He has not had his CAT scans yet.  He does not smoke.  He has never had any history of cancer.  I talked to his wife on the phone.  She said that he has not had any skin lesions or skin cancer taken off.  I would like to hope that this mass can be removed.  I realize that it is quite large.  However, this is clearly causing his symptoms.  His labs look okay.  I know something with the labs that would suggest a primary site.  Again, I suspect this is going to come down to whether or not this tumor can be resected.  Clearly, with resection, we can get specimen for histology and also molecular studies.  There is clearly could be a primary glioblastoma.  If that is the case, then molecular markers are critical.  He seems to be in good shape.  He works part-time.  I do not see any reason why aggressive intervention cannot be undertaken for him.  Again I talked to his wife on the phone.  She is very nice.  I told her that we just do not know what is going on as of yet until we get the CAT scans done, but ultimately will get a specimen to examine under the microscope.  She understands all of this.  I really cannot discuss prognosis at this point since we do not have any pathologic diagnosis.  I suppose this could be a primary CNS lymphoma.  I watch her of these really  are necrotic.  A metastatic focus is always possible although he has nothing on his exam that would suggest a primary malignancy.  We will follow along closely and make recommendations as we obtain more data.    Lattie Haw, MD  Psalm 30:2

## 2020-09-13 NOTE — ED Notes (Signed)
Wife requests that MD call her w MRI results, number in chart
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
## 2020-09-13 NOTE — ED Notes (Signed)
Pt to MRI

## 2020-09-13 NOTE — ED Notes (Signed)
First contact. Change of shift. Pt resting in bed. NADN 

## 2020-09-13 NOTE — Consult Note (Signed)
Neurology Consultation  CC: Seizure  History is obtained from: patent and wife  HPI: Mathew Leach is a 69 y.o. male PMHx as reviewed below was fine at work ~1600 and only felt a little fatigued. He only remembers falling then waking up on the floor confused and being told he had a seizure. His wife reports there was urinary incontinence. EMS activated and during transport he had witnessed generalized seizure and received midazolam IV.    Denies EtOH/illicits; denies headache/dizziness, fevers, cough, shortness of breath, chest pain, nausea, vomiting, abdominal pain, diarrhea.    He has never had seizure in the past and no history of brain mass.  The patient reportedly had stroke about 5 years ago presenting as slumped over with right hemibody numbness followed by full recovery when in [place].   ROS: A complete ROS was performed and is negative except as noted in the HPI.   Past Medical History:  Diagnosis Date  . Actinic keratosis    scalp  . Arthritis   . ASD (atrial septal defect) 06/10/2015  . Atrial flutter, paroxysmal ([place]) 08/01/2015  . Chronic anticoagulation 08/29/2015  . Chronic pain of right knee 12/07/2014  . Dysplastic nevus 01/11/2015   upper back spinal  . Dysplastic nevus 01/29/2017   right superior calf inferior to popliteal  . Dysrhythmia    AFIB  . Hypertension   . Kidney stones   . Obstructive sleep apnea syndrome 06/26/2015  . PAF (paroxysmal atrial fibrillation) ([place]) 07/30/2015  . Pericarditis    1999  . PFO (patent foramen ovale) 06/16/2018  . Preoperative cardiovascular examination 02/03/2016  . 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 Shortness of breath dyspnea    W/ EXERTION   . Sleep apnea    CPAP  . Stroke [facility])    04-2015    Family History  Problem Relation Age of Onset  . Lung cancer Father   . Colon cancer Neg Hx   . Esophageal cancer Neg Hx   . Colon polyps Neg Hx   . Rectal cancer Neg Hx   . Stomach cancer Neg  Hx    Social History:  reports that he has never smoked. He has never used smokeless tobacco. He reports that he does not drink alcohol and does not use drugs.  Prior to Admission medications   Medication Sig Start Date End Date Taking? Authorizing Provider  amLODipine-benazepril (LOTREL) 10-20 MG capsule Take 1 capsule by mouth daily. 07/16/20  Yes [name], MD  aspirin (BAYER LOW DOSE) 81 MG EC tablet Take 81 mg by mouth daily. Swallow whole.   Yes [provider]  Cholecalciferol (VITAMIN D3) 50 MCG (2000 UT) TABS Take 2,000 Units by mouth daily with breakfast.   Yes [provider]  folic acid (FOLVITE) 161 MCG tablet Take 800 mcg by mouth daily.   Yes [provider]  Omega-3 Fatty Acids (FISH OIL) 1000 MG CAPS Take 1,000 mg by mouth daily.    Yes [provider]  PRESCRIPTION MEDICATION See admin instructions. CPAP- At bedtime   Yes [provider]  simvastatin (ZOCOR) 20 MG tablet Take 1 tablet (20 mg total) by mouth daily. Patient taking differently: Take 20 mg by mouth daily with lunch.  07/16/20  Yes [name], MD   Exam: Current vital signs: BP 126/73   Pulse 87   Temp 97.6 F (36.4 C) (Temporal)   Resp (!) 25   SpO2 97%   Physical  Exam  Constitutional: Appears well-developed and well-nourished.  Psych: Affect appropriate to situation Eyes: No scleral injection HENT: No OP obstrucion Head: Normocephalic.  Cardiovascular: Normal rate and regular rhythm.  Respiratory: Effort normal and breath sounds normal to anterior ascultation GI: Soft.  No distension. There is no tenderness.  Skin: WDI  Neuro: Mental Status: Patient is awake, alert, oriented to person, place, month, year, and situation. Patient is able to give a clear and coherent history. No signs of aphasia or neglect Cranial Nerves: II: Visual Fields are full. Pupils are equal, round, and reactive to light.  III,IV, VI: EOMI without ptosis or diploplia.   V: Facial sensation is symmetric to temperature VII: Facial movement is symmetric.  VIII: hearing is intact to voice X: Uvula elevates symmetrically XI: Shoulder shrug is symmetric. XII: tongue is midline without atrophy or fasciculations.  Motor: Tone is normal. Bulk is normal. 5/5 strength was present in all four extremities.  Sensory: Sensation is symmetric to light touch and temperature in the arms and legs.  Deep Tendon Reflexes: 2+ and symmetric in the biceps and patellae.  Plantars: Toes are downgoing bilaterally.  Cerebellar: FNF and HKS are intact bilaterally.   I have reviewed the images obtained: NCT head showed hypodense lesion in left frontal lobe ~4 x 3.6 x 4 cm with surrounding edema and chronic infarct of the left thalamus.  Assessment: Mathew Leach is a 69 y.o. 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 Seizure was likely provoked and will need anti-seizure medication and further evaluation of the brain mass.  The patient had 15mm Watchman implantation 08/04/2019 at [facility] and device is MRI conditional 6 (safe) in static magnetic field up to 3T.  Impression:  Provoked seizure. Left frontal lobe brain mass. Perilesonal vasogenic edema  Chronic left thalamic stroke.  Plan: - Continue dexamethasone - half-life is such that once to twice daily dosing may be adequate, and monitor for steroid responsiveness (~2-3 days) and titrate as needed. - MRI brain with contrast - Ideally Dynamic contrast-enhanced susceptibility weighted perfusion with MR Spectroscopy. - rEEG to eval for any epileptogenic discharges. - Continue levetiracetam 500mg  two times daily maintenance and titrate as needed. - Consider neurosurgery and neurooncology consultation. - Neurology will continue to follow.  Discussed [place] law regarding driving - Do not drive until seizure-free for 6 months. Use caution when using heavy equipment, power tools, avoid  working on ladders or at heights. Take showers instead of baths. Do not go swimming alone. Do not lock yourself in a room alone (i.e. bathroom). When caring for infants or small children, sit down when holding, feeding, or changing them to minimize risk of injury to the child in the event you have a seizure. Maintain good sleep hygiene.    Electronically signed by: Dr. Lynnae Sandhoff Pager: [phone] 09/13/2020, 1:37 AM

## 2020-09-14 ENCOUNTER — Inpatient Hospital Stay (HOSPITAL_COMMUNITY): Payer: Medicare Other

## 2020-09-14 ENCOUNTER — Other Ambulatory Visit: Payer: Self-pay | Admitting: Neurosurgery

## 2020-09-14 DIAGNOSIS — R569 Unspecified convulsions: Secondary | ICD-10-CM

## 2020-09-14 LAB — CBC
HCT: 40.4 % (ref 39.0–52.0)
Hemoglobin: 13.6 g/dL (ref 13.0–17.0)
MCH: 30.6 pg (ref 26.0–34.0)
MCHC: 33.7 g/dL (ref 30.0–36.0)
MCV: 91 fL (ref 80.0–100.0)
Platelets: 279 10*3/uL (ref 150–400)
RBC: 4.44 MIL/uL (ref 4.22–5.81)
RDW: 13 % (ref 11.5–15.5)
WBC: 19.7 10*3/uL — ABNORMAL HIGH (ref 4.0–10.5)
nRBC: 0 % (ref 0.0–0.2)

## 2020-09-14 LAB — GLUCOSE, CAPILLARY
Glucose-Capillary: 135 mg/dL — ABNORMAL HIGH (ref 70–99)
Glucose-Capillary: 128 mg/dL — ABNORMAL HIGH (ref 70–99)
Glucose-Capillary: 118 mg/dL — ABNORMAL HIGH (ref 70–99)
Glucose-Capillary: 131 mg/dL — ABNORMAL HIGH (ref 70–99)

## 2020-09-14 LAB — BASIC METABOLIC PANEL
Anion gap: 7 (ref 5–15)
BUN: 18 mg/dL (ref 8–23)
CO2: 21 mmol/L — ABNORMAL LOW (ref 22–32)
Calcium: 8.8 mg/dL — ABNORMAL LOW (ref 8.9–10.3)
Chloride: 113 mmol/L — ABNORMAL HIGH (ref 98–111)
Creatinine, Ser: 1.15 mg/dL (ref 0.61–1.24)
GFR, Estimated: >60 mL/min (ref >60)
Glucose, Bld: 159 mg/dL — ABNORMAL HIGH (ref 70–99)
Potassium: 4.1 mmol/L (ref 3.5–5.1)
Sodium: 141 mmol/L (ref 135–145)

## 2020-09-14 LAB — HEMOGLOBIN A1C
Hgb A1c MFr Bld: 5.8 % — ABNORMAL HIGH (ref 4.8–5.6)
Mean Plasma Glucose: 120 mg/dL

## 2020-09-14 MED ORDER — GADOBUTROL 1 MMOL/ML IV SOLN
8.0000 mL | Freq: Once | INTRAVENOUS | Status: AC | PRN
  Administered 2020-09-19: 8 mL via INTRAVENOUS

## 2020-09-14 MED ORDER — GADOBUTROL 1 MMOL/ML IV SOLN
8.0000 mL | Freq: Once | INTRAVENOUS | Status: AC | PRN
  Administered 2020-09-14: 8 mL via INTRAVENOUS

## 2020-09-14 NOTE — Progress Notes (Signed)
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  With no further seizures, I would continue his current Keppra dose.  Further management per oncology/neurosurgery.  Recommendations: 1) continue Keppra 1 g twice daily 2) neurology will be available if he has any further seizures.  Please call with any further questions or concerns.  Roland Rack, MD [facility] [phone]  If 7pm- 7am, please page neurology on call as listed in [place].

## 2020-09-14 NOTE — Progress Notes (Signed)
  NEUROSURGERY PROGRESS NOTE   No issues overnight. Pt feels well, no further seizures.  EXAM:  BP 120/67 (BP Location: Right Arm)   Pulse 72   Temp 97.6 F (36.4 C) (Oral)   Resp 18   Ht 5\' 8"  (1.727 m)   Wt 81 kg   SpO2 93%   BMI 27.15 kg/m   Awake, alert, oriented  Speech fluent, appropriate  CN grossly intact  Good strenght BUE/BLE, slight RUE weakness.  IMAGING: MRI brain reviewed and is significantly motion degraded. It nonetheless demonstrates a peripherally enhancing cystic lesion in the left frontal lesion measuring >4cm. There is significant local mass effect/effacement of the left lateral ventricle and associated edema. No HCP. No other enhancing lesions are seen.  CT C/A/P does demonstrate a lesion within the right kidney concerning for neoplasm and associated iliac lucencies concerning for possible metastatic disease.  IMPRESSION:  69 y.o. 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 Regardless, with the size of the lesion it will need to be resected.  PLAN: - Will plan on proceeding with left frontal craniotomy for resection of tumor on Tuesday 11/2 at 1p - Cont Keppra - Cont dexamethasone  I have reviewed the situation with the patient. We discussed the need for surgery and the associated risks including possible weakness, numbness, paralysis, and speech difficulty. Other risks include worsening SZ, bleeding, infection, hydrocephalus, and CSF leak. Pt appeared to understand our discussion and agreed to proceed with surgery.  All his questions today were answered. Pt requested I speak with his wife, I will call her later this afternoon.   Consuella Lose, MD [facility]

## 2020-09-14 NOTE — Progress Notes (Signed)
The CT scans that [name] had done might show that there could be a primary in the right kidney.  He has bilateral renal cysts.  In the inferior pole of the right kidney, there is a cyst measuring 11 cm.  There are some intramural nodules along the periphery of the cyst which might suggest a neoplastic process.  It is certainly conceivable that this might be a metastatic renal cell carcinoma to the brain.  Regardless, I still think that he is going need to have this mass resected.  Renal cell carcinoma is generally radioresistant.  As such, I think that the tumor should be "debulked" and we could easily get pathology from this.  We will get an LDH on him also.  Sometimes with metastatic kidney cancer, there is hypercalcemia.  We do not have that right now.  Again, I still think that the main tumor in the frontal lobe really needs to be resected.  We will see with the MRI of the kidney show.  We will see if this gives [place] any more detail as to whether or not we are dealing with a primary kidney cancer.   Lattie Haw, MD  Philippians 4:4

## 2020-09-14 NOTE — Progress Notes (Signed)
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 with a high frequency filter of 70Hz  and a low frequency filter of 1Hz . EEG data were recorded continuously and digitally stored. Description: The posterior dominant rhythm consists of 9 Hz activity of moderate voltage (25-35 uV) seen predominantly in posterior head regions, asymmetric ( L<R) and reactive to eye opening and eye closing. Sleep was characterized by vertex waves, sleep spindles (12 to 14 Hz), maximal frontocentral region.  EEG showed continuous 3 to 5 Hz theta-delta slowing in left hemisphere, maximal left frontal region.  Sharp waves were also noted in left frontal region. 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 Physiologic photic driving was not seen during photic stimulation.  Hyperventilation was not performed.   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  No seizures were seen throughout the recording. Priyanka Barbra Sarks         Scheduled Meds: . amLODipine  10 mg Oral Daily  . dexamethasone (DECADRON) injection  4 mg Intravenous Q6H  . insulin aspart  0-15 Units Subcutaneous TID WC  . insulin aspart  0-5 Units Subcutaneous QHS  . levETIRAcetam  1,000 mg Oral BID  . simvastatin  20 mg Oral q1800   Continuous Infusions: . sodium chloride 100 mL/hr (09/14/20 0611)     LOS: 1 day    Time spent: 45 mins    Emeterio Reeve, DO [facility]  If 7PM-7AM, please contact night-coverage [email or web address] 09/14/2020, 12:36 PM

## 2020-09-15 DIAGNOSIS — R569 Unspecified convulsions: Secondary | ICD-10-CM | POA: Diagnosis not present

## 2020-09-15 LAB — GLUCOSE, CAPILLARY
Glucose-Capillary: 130 mg/dL — ABNORMAL HIGH (ref 70–99)
Glucose-Capillary: 130 mg/dL — ABNORMAL HIGH (ref 70–99)
Glucose-Capillary: 121 mg/dL — ABNORMAL HIGH (ref 70–99)
Glucose-Capillary: 123 mg/dL — ABNORMAL HIGH (ref 70–99)

## 2020-09-15 LAB — MRSA PCR SCREENING: MRSA by PCR: NEGATIVE

## 2020-09-15 LAB — LACTATE DEHYDROGENASE: LDH: 127 U/L (ref 98–192)

## 2020-09-15 NOTE — Progress Notes (Signed)
Mathew Leach had the MRI of the abdomen yesterday.  There is still suggestion that the large cyst in the right kidney is malignant.  There is some contrast-enhancement.  There is thickened nodular wall and internal septations.  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  This will be on Tuesday.  The LDH is 127.  Typically, metastatic renal cell carcinoma, the LDH will be elevated.  We will continue to follow along.  We have I want to be able to do or say too much until we have the pathology from the resected brain met.  Lattie Haw, MD  Psalm 41:1

## 2020-09-15 NOTE — Progress Notes (Signed)
Subjective: Patient reports No complaints this morning  Objective: Vital signs in last 24 hours: Temp:  [97.6 F (36.4 C)-99 F (37.2 C)] 97.8 F (36.6 C) (10/30 0729) Pulse Rate:  [58-72] 58 (10/30 0729) Resp:  [18-20] 18 (10/30 0729) BP: (105-120)/(60-91) 111/75 (10/30 0729) SpO2:  [93 %-100 %] 96 % (10/30 0729)  Intake/Output from previous day: 10/29 0701 - 10/30 0700 In: 240 [P.O.:240] Out: 400 [Urine:400] Intake/Output this shift: No intake/output data recorded.  Neurologically nonfocal  Lab Results: Recent Labs    09/12/20 1822 09/14/20 0229  WBC 9.0 19.7*  HGB 14.8 13.6  HCT 46.6 40.4  PLT 323 279   BMET Recent Labs    09/13/20 0354 26/83/41 9622  NA DUPLICATE  297 989  K DUPLICATE  4.2 4.1  CL DUPLICATE  211 941*  CO2 DUPLICATE  22 21*  GLUCOSE DUPLICATE  740* 814*  BUN DUPLICATE  15 18  CREATININE DUPLICATE  4.81 8.56  CALCIUM DUPLICATE  9.0 8.8*    Studies/Results: CT ABDOMEN PELVIS W WO CONTRAST  Result Date: 09/13/2020 CLINICAL DATA:  69 year old male with brain mass. Staging. EXAM: CT CHEST, ABDOMEN, AND PELVIS WITH CONTRAST TECHNIQUE: Multidetector CT imaging of the chest, abdomen and pelvis was performed following the standard protocol during bolus administration of intravenous contrast. CONTRAST:  182mL OMNIPAQUE IOHEXOL 300 MG/ML  SOLN COMPARISON:  Chest radiograph dated 09/12/2020. FINDINGS: CT CHEST FINDINGS Cardiovascular: There is no cardiomegaly or pericardial effusion. Advanced 3 vessel coronary vascular calcification. The thoracic aorta is unremarkable. The origins of the great vessels of the aortic arch appear patent. The central pulmonary arteries are patent as visualized. Mediastinum/Nodes: There is no hilar or mediastinal adenopathy. Small amount of retained ingested content within the distal esophagus, likely reflux or delayed clearance. The esophagus and the thyroid gland are otherwise unremarkable. No mediastinal fluid  collection. Lungs/Pleura: Minimal bibasilar dependent atelectasis. No consolidative changes. There is no pleural effusion or pneumothorax. There is a faint 5 mm ground-glass focus in the right apex (27/4) which is indeterminate and may represent an area of atelectasis. Attention on follow-up imaging recommended. The central airways are patent. Musculoskeletal: There is a faint subcentimeter lytic lesion in the lateral aspect of the right seventh rib (sagittal 30/7 and axial 99/4). There is osteopenia with degenerative changes of the spine. No acute osseous pathology. CT ABDOMEN PELVIS FINDINGS No intra-abdominal free air or free fluid. Hepatobiliary: There is a 2 cm indeterminate hypodense lesion in the inferior aspect of the right lobe of the liver. There is background of fatty infiltration of the liver. No intrahepatic biliary ductal dilatation. The gallbladder is unremarkable Pancreas: Unremarkable. No pancreatic ductal dilatation or surrounding inflammatory changes. Spleen: Normal in size without focal abnormality. Adrenals/Urinary Tract: The adrenal glands are unremarkable. There are bilateral renal cysts measuring up to 11 cm in the inferior pole of the right kidney. The right renal inferior pole cyst has thin calcified septation. There is a 5.5 cm cyst in the interpolar aspect of the right kidney. There are intramural nodules along the periphery of the cyst concerning for a neoplastic process. Further evaluation with renal mass protocol MRI is advised. There is no hydronephrosis on either side. There is symmetric enhancement and excretion of contrast by both kidneys. The visualized ureters and urinary bladder appear unremarkable. Stomach/Bowel: Small hiatal hernia. There is sigmoid diverticulosis without active inflammatory changes. There is no bowel obstruction or active inflammation. The appendix is normal. Vascular/Lymphatic: Moderate aortoiliac atherosclerotic disease. The IVC is unremarkable.  No portal  venous gas. There is no adenopathy. Reproductive: The prostate and seminal vesicles are grossly unremarkable. No pelvic mass. Other: Small fat containing umbilical hernia. Musculoskeletal: There is osteopenia with degenerative changes of the spine and mild levoscoliosis. Small lucent lesions involving the left iliac crest may represent metastatic due to osteopenia. Additional scattered small lucent lesions noted throughout the pelvis. IMPRESSION: 1. Findings concerning for renal neoplasm arising from the interpolar aspect of the right kidney. Further evaluation with renal mass protocol MRI recommended. 2. Indeterminate 2 cm hypodense lesion in the inferior aspect of the right lobe of the liver. 3. Several small osseous lucent lesions concerning for metastatic disease. 4. Faint small ground-glass nodule versus atelectasis in the right apex. 5. Sigmoid diverticulosis. No bowel obstruction. Normal appendix. 6. Aortic Atherosclerosis (ICD10-I70.0). Electronically Signed   By: Anner Crete M.D.   On: 09/13/2020 21:41   CT CHEST W CONTRAST  Result Date: 09/13/2020 CLINICAL DATA:  69 year old male with brain mass. Staging. EXAM: CT CHEST, ABDOMEN, AND PELVIS WITH CONTRAST TECHNIQUE: Multidetector CT imaging of the chest, abdomen and pelvis was performed following the standard protocol during bolus administration of intravenous contrast. CONTRAST:  151mL OMNIPAQUE IOHEXOL 300 MG/ML  SOLN COMPARISON:  Chest radiograph dated 09/12/2020. FINDINGS: CT CHEST FINDINGS Cardiovascular: There is no cardiomegaly or pericardial effusion. Advanced 3 vessel coronary vascular calcification. The thoracic aorta is unremarkable. The origins of the great vessels of the aortic arch appear patent. The central pulmonary arteries are patent as visualized. Mediastinum/Nodes: There is no hilar or mediastinal adenopathy. Small amount of retained ingested content within the distal esophagus, likely reflux or delayed clearance. The esophagus  and the thyroid gland are otherwise unremarkable. No mediastinal fluid collection. Lungs/Pleura: Minimal bibasilar dependent atelectasis. No consolidative changes. There is no pleural effusion or pneumothorax. There is a faint 5 mm ground-glass focus in the right apex (27/4) which is indeterminate and may represent an area of atelectasis. Attention on follow-up imaging recommended. The central airways are patent. Musculoskeletal: There is a faint subcentimeter lytic lesion in the lateral aspect of the right seventh rib (sagittal 30/7 and axial 99/4). There is osteopenia with degenerative changes of the spine. No acute osseous pathology. CT ABDOMEN PELVIS FINDINGS No intra-abdominal free air or free fluid. Hepatobiliary: There is a 2 cm indeterminate hypodense lesion in the inferior aspect of the right lobe of the liver. There is background of fatty infiltration of the liver. No intrahepatic biliary ductal dilatation. The gallbladder is unremarkable Pancreas: Unremarkable. No pancreatic ductal dilatation or surrounding inflammatory changes. Spleen: Normal in size without focal abnormality. Adrenals/Urinary Tract: The adrenal glands are unremarkable. There are bilateral renal cysts measuring up to 11 cm in the inferior pole of the right kidney. The right renal inferior pole cyst has thin calcified septation. There is a 5.5 cm cyst in the interpolar aspect of the right kidney. There are intramural nodules along the periphery of the cyst concerning for a neoplastic process. Further evaluation with renal mass protocol MRI is advised. There is no hydronephrosis on either side. There is symmetric enhancement and excretion of contrast by both kidneys. The visualized ureters and urinary bladder appear unremarkable. Stomach/Bowel: Small hiatal hernia. There is sigmoid diverticulosis without active inflammatory changes. There is no bowel obstruction or active inflammation. The appendix is normal. Vascular/Lymphatic: Moderate  aortoiliac atherosclerotic disease. The IVC is unremarkable. No portal venous gas. There is no adenopathy. Reproductive: The prostate and seminal vesicles are grossly unremarkable. No pelvic mass. Other: Small fat  containing umbilical hernia. Musculoskeletal: There is osteopenia with degenerative changes of the spine and mild levoscoliosis. Small lucent lesions involving the left iliac crest may represent metastatic due to osteopenia. Additional scattered small lucent lesions noted throughout the pelvis. IMPRESSION: 1. Findings concerning for renal neoplasm arising from the interpolar aspect of the right kidney. Further evaluation with renal mass protocol MRI recommended. 2. Indeterminate 2 cm hypodense lesion in the inferior aspect of the right lobe of the liver. 3. Several small osseous lucent lesions concerning for metastatic disease. 4. Faint small ground-glass nodule versus atelectasis in the right apex. 5. Sigmoid diverticulosis. No bowel obstruction. Normal appendix. 6. Aortic Atherosclerosis (ICD10-I70.0). Electronically Signed   By: Anner Crete M.D.   On: 09/13/2020 21:41   MR Brain W and Wo Contrast  Result Date: 09/13/2020 CLINICAL DATA:  Seizure, brain mass on CT EXAM: MRI HEAD WITHOUT AND WITH CONTRAST TECHNIQUE: Multiplanar, multiecho pulse sequences of the brain and surrounding structures were obtained without and with intravenous contrast. CONTRAST:  67mL GADAVIST GADOBUTROL 1 MMOL/ML IV SOLN COMPARISON:  Correlation made with prior CT FINDINGS: Motion artifact is present. Brain: In the left frontal lobe, there is a peripherally enhancing and centrally necrotic lesion measuring approximately 4.8 x 4.7 x 4.4 cm. Susceptibility is present at the margins likely reflecting chronic blood products. There is no reduced diffusion centrally. There is a second or satellite lesion posteriorly along the margin of the left lateral ventricle also demonstrating peripheral enhancement. This measures  approximately 5 mm (series 11, image 143). There is T2 hyperintensity about mass with regional mass effect including partial effacement of the ventral left lateral ventricle and mild subfalcine herniation. Dural thickening or thin subdural collection (2 mm) along right cerebral convexity at the vertex (for example series 701, image 364). There is no acute infarction or intracranial hemorrhage. Chronic left thalamic infarct. No hydrocephalus or extra-axial collection. Vascular: Major vessel flow voids at the skull base are preserved. Skull and upper cervical spine: Normal marrow signal is preserved. Sinuses/Orbits: Chronic right sphenoid sinusitis. Orbits are unremarkable. Other: Sella is unremarkable.  Mastoid air cells are clear. IMPRESSION: Motion degraded study. 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 Second/satellite subcentimeter adjacent lesion. Differential considerations are metastasis and primary high-grade neoplasm. Dural thickening or thin subdural collection along the right cerebral convexity at the vertex. Electronically Signed   By: Macy Mis M.D.   On: 09/13/2020 13:16   MR ABDOMEN W WO CONTRAST  Result Date: 09/14/2020 CLINICAL DATA:  Brain lesion, possible renal mass identified on prior CT EXAM: MRI ABDOMEN WITHOUT AND WITH CONTRAST TECHNIQUE: Multiplanar multisequence MR imaging of the abdomen was performed both before and after the administration of intravenous contrast. CONTRAST:  64mL GADAVIST GADOBUTROL 1 MMOL/ML IV SOLN COMPARISON:  CT abdomen pelvis, 09/13/2020 FINDINGS: Examination is generally limited by motion artifact throughout, particularly on postcontrast sequences. Lower chest: No acute findings. Hepatobiliary: No mass or other parenchymal abnormality identified. Nonenhancing lobulated cyst of the posterior right lobe of the liver (series 5, image 3) Pancreas: No mass, inflammatory changes, or other parenchymal abnormality identified. Spleen:   Within normal limits in size and appearance. Adrenals/Urinary Tract: Numerous renal cysts cysts bilaterally, particularly large exophytic cysts arising from the inferior poles of the bilateral kidneys. There is a cyst of the lateral midportion of the right kidney with a very thickened, nodular wall and thickened internal septations (series 4, image 8). Post-contrast sequences are unfortunately significantly limited by breath motion artifact, however there  does appear to be some contrast enhancement associated with mural components. No evidence of hydronephrosis. Stomach/Bowel: Descending colonic diverticulosis. Vascular/Lymphatic: No pathologically enlarged lymph nodes identified. No abdominal aortic aneurysm demonstrated. Other:  None. Musculoskeletal: No suspicious bone lesions identified. IMPRESSION: 1. Numerous renal cysts bilaterally. As seen on prior CT examination, there is an exophytic cyst of the lateral midportion of the right kidney with a very thickened, nodular wall and thickened internal septations. Post-contrast sequences on this examination are significantly limited by breath motion artifact, however there does appear to be some contrast enhancement associated with mural components. This is highly concerning for a renal cell carcinoma, however, this lesion is not a likely source of brain mass identified by MR given the absence of lymphadenopathy or other findings of metastatic disease in the abdomen. 2. As above, no evidence of lymphadenopathy or metastatic disease in the abdomen. No evidence of osseous metastatic disease on this examination which is not tailored for evaluation of the osseous structures. 3. Lesion of the posterior right lobe of the liver identified by prior CT is a benign, lobulated cyst. Electronically Signed   By: Eddie Candle M.D.   On: 09/14/2020 13:44   EEG adult  Result Date: 09/13/2020 Lora Havens, MD     09/13/2020 10:01 AM Patient Name: Mathew Leach MRN:  [id number] Epilepsy Attending: Lora Havens Referring Physician/Provider: Dr Derrick Ravel Date: 09/13/2020 Duration: 23.29 mins Patient history: y.o. 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 EEG to evaluate for seizure Level of alertness: Awake, asleep AEDs during EEG study: LEV Technical aspects: This EEG study was done with scalp electrodes positioned according to the 10-20 International system of electrode placement. Electrical activity was acquired at a sampling rate of 500Hz  and reviewed with a high frequency filter of 70Hz  and a low frequency filter of 1Hz . EEG data were recorded continuously and digitally stored. Description: The posterior dominant rhythm consists of 9 Hz activity of moderate voltage (25-35 uV) seen predominantly in posterior head regions, asymmetric ( L<R) and reactive to eye opening and eye closing. Sleep was characterized by vertex waves, sleep spindles (12 to 14 Hz), maximal frontocentral region.  EEG showed continuous 3 to 5 Hz theta-delta slowing in left hemisphere, maximal left frontal region.  Sharp waves were also noted in left frontal region. 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 Physiologic photic driving was not seen during photic stimulation.  Hyperventilation was not performed.   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  No seizures were seen throughout the recording. Priyanka Barbra Sarks    Assessment/Plan: Plan per Dr. Kathyrn Sheriff awaiting surgery probably on Tuesday  LOS: 2 days     Elaina Hoops 09/15/2020, 8:20 AM

## 2020-09-15 NOTE — Progress Notes (Signed)
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 ABDOMEN WITHOUT AND WITH CONTRAST TECHNIQUE: Multiplanar multisequence MR imaging of the abdomen was performed both before and after the administration of intravenous contrast. CONTRAST:  43m GADAVIST GADOBUTROL 1 MMOL/ML IV SOLN COMPARISON:  CT abdomen pelvis, 09/13/2020 FINDINGS: Examination is generally limited by motion artifact throughout, particularly on postcontrast sequences. Lower chest: No acute findings. Hepatobiliary: No mass or other parenchymal abnormality identified. Nonenhancing lobulated cyst of the posterior right lobe of the liver (series 5, image 3) Pancreas: No mass, inflammatory changes, or other parenchymal abnormality identified. Spleen:  Within normal limits in size and appearance. Adrenals/Urinary Tract: Numerous renal cysts cysts bilaterally, particularly large  exophytic cysts arising from the inferior poles of the bilateral kidneys. There is a cyst of the lateral midportion of the right kidney with a very thickened, nodular wall and thickened internal septations (series 4, image 8). Post-contrast sequences are unfortunately significantly limited by breath motion artifact, however there does appear to be some contrast enhancement associated with mural components. No evidence of hydronephrosis. Stomach/Bowel: Descending colonic diverticulosis. Vascular/Lymphatic: No pathologically enlarged lymph nodes identified. No abdominal aortic aneurysm demonstrated. Other:  None. Musculoskeletal: No suspicious bone lesions identified. IMPRESSION: 1. Numerous renal cysts bilaterally. As seen on prior CT examination, there is an exophytic cyst of the lateral midportion of the right kidney with a very thickened, nodular wall and thickened internal septations. Post-contrast sequences on this examination are significantly limited by breath motion artifact, however there does appear to be some contrast enhancement associated with mural components. This is highly concerning for a renal cell carcinoma, however, this lesion is not a likely source of brain mass identified by MR given the absence of lymphadenopathy or other findings of metastatic disease in the abdomen. 2. As above, no evidence of lymphadenopathy or metastatic disease in the abdomen. No evidence of osseous metastatic disease on this examination which is not tailored for evaluation of the osseous structures. 3. Lesion of the posterior right lobe of the liver identified by prior CT is a benign, lobulated cyst. Electronically Signed   By: AEddie CandleM.D.   On: 09/14/2020 13:44   DG Chest Portable 1 View  Result Date: 09/12/2020 CLINICAL DATA:  Lethargic EXAM: PORTABLE CHEST 1 VIEW COMPARISON:  03/06/2016 FINDINGS: The heart size and mediastinal contours are within  normal limits. Both lungs are clear. The visualized skeletal  structures are unremarkable. IMPRESSION: No active disease. Electronically Signed   By: Donavan Foil M.D.   On: 09/12/2020 18:43   EEG adult  Result Date: 09/13/2020 Lora Havens, MD     09/13/2020 10:01 AM Patient Name: Mathew Leach MRN: [id number] Epilepsy Attending: Lora Havens Referring Physician/Provider: Dr Derrick Ravel Date: 09/13/2020 Duration: 23.29 mins Patient history: y.o. 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 EEG to evaluate for seizure Level of alertness: Awake, asleep AEDs during EEG study: LEV Technical aspects: This EEG study was done with scalp electrodes positioned according to the 10-20 International system of electrode placement. Electrical activity was acquired at a sampling rate of 500Hz and reviewed with a high frequency filter of 70Hz and a low frequency filter of 1Hz. EEG data were recorded continuously and digitally stored. Description: The posterior dominant rhythm consists of 9 Hz activity of moderate voltage (25-35 uV) seen predominantly in posterior head regions, asymmetric ( L<R) and reactive to eye opening and eye closing. Sleep was characterized by vertex waves, sleep spindles (12 to 14 Hz), maximal frontocentral region.  EEG showed continuous 3 to 5 Hz theta-delta slowing in left hemisphere, maximal left frontal region.  Sharp waves were also noted in left frontal region. 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 Physiologic photic driving was not seen during photic stimulation.  Hyperventilation was not performed.   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  No seizures were seen throughout the  recording. Priyanka Barbra Sarks         Scheduled Meds: . amLODipine  10 mg Oral Daily  . dexamethasone (DECADRON) injection  4 mg Intravenous Q6H  . insulin aspart  0-15 Units Subcutaneous TID WC  . insulin aspart  0-5 Units Subcutaneous QHS  . levETIRAcetam  1,000 mg Oral BID  . simvastatin  20 mg Oral q1800   Continuous Infusions: . sodium chloride 100 mL/hr (09/15/20 0922)     LOS: 2 days    Time spent: 80 mins    Emeterio Reeve, DO [facility]  If 7PM-7AM, please contact night-coverage [email or web address] 09/15/2020, 4:15 PM

## 2020-09-16 DIAGNOSIS — R569 Unspecified convulsions: Secondary | ICD-10-CM | POA: Diagnosis not present

## 2020-09-16 LAB — BASIC METABOLIC PANEL
Anion gap: 10 (ref 5–15)
BUN: 24 mg/dL — ABNORMAL HIGH (ref 8–23)
CO2: 21 mmol/L — ABNORMAL LOW (ref 22–32)
Calcium: 8.5 mg/dL — ABNORMAL LOW (ref 8.9–10.3)
Chloride: 109 mmol/L (ref 98–111)
Creatinine, Ser: 1.07 mg/dL (ref 0.61–1.24)
GFR, Estimated: >60 mL/min (ref >60)
Glucose, Bld: 131 mg/dL — ABNORMAL HIGH (ref 70–99)
Potassium: 4 mmol/L (ref 3.5–5.1)
Sodium: 140 mmol/L (ref 135–145)

## 2020-09-16 LAB — GLUCOSE, CAPILLARY
Glucose-Capillary: 116 mg/dL — ABNORMAL HIGH (ref 70–99)
Glucose-Capillary: 132 mg/dL — ABNORMAL HIGH (ref 70–99)
Glucose-Capillary: 130 mg/dL — ABNORMAL HIGH (ref 70–99)
Glucose-Capillary: 136 mg/dL — ABNORMAL HIGH (ref 70–99)

## 2020-09-16 LAB — CBC
HCT: 39.1 % (ref 39.0–52.0)
Hemoglobin: 12.9 g/dL — ABNORMAL LOW (ref 13.0–17.0)
MCH: 29.6 pg (ref 26.0–34.0)
MCHC: 33 g/dL (ref 30.0–36.0)
MCV: 89.7 fL (ref 80.0–100.0)
Platelets: 260 10*3/uL (ref 150–400)
RBC: 4.36 MIL/uL (ref 4.22–5.81)
RDW: 12.7 % (ref 11.5–15.5)
WBC: 13.8 10*3/uL — ABNORMAL HIGH (ref 4.0–10.5)
nRBC: 0 % (ref 0.0–0.2)

## 2020-09-16 LAB — LACTATE DEHYDROGENASE: LDH: 154 U/L (ref 98–192)

## 2020-09-16 NOTE — Progress Notes (Signed)
NEUROSURGERY PROGRESS NOTE  Doing well, no acute events overnight  Temp:  [97.7 F (36.5 C)-98.4 F (36.9 C)] 98 F (36.7 C) (10/31 0724) Pulse Rate:  [62-71] 62 (10/31 0724) Resp:  [18-22] 18 (10/31 0724) BP: (110-153)/(59-80) 132/74 (10/31 0724) SpO2:  [91 %-95 %] 91 % (10/31 0724)  Plan: Plan for crani on Tuesday for resection of tumor.   Eleonore Chiquito, NP 09/16/2020 8:21 AM

## 2020-09-16 NOTE — Progress Notes (Signed)
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   By: Macy Mis M.D.   On: 09/13/2020 13:16   MR ABDOMEN W WO CONTRAST  Result Date: 09/14/2020 CLINICAL DATA:  Brain lesion, possible renal mass identified on prior CT EXAM: MRI ABDOMEN WITHOUT AND WITH CONTRAST TECHNIQUE: Multiplanar multisequence MR imaging of the abdomen was performed both before and after the administration of intravenous contrast. CONTRAST:  11m GADAVIST GADOBUTROL 1 MMOL/ML IV SOLN COMPARISON:  CT abdomen pelvis, 09/13/2020 FINDINGS: Examination is generally limited by motion artifact throughout, particularly on postcontrast sequences. Lower chest: No acute findings. Hepatobiliary: No mass or other parenchymal abnormality identified. Nonenhancing lobulated cyst of the posterior right lobe of the liver (series 5, image 3) Pancreas: No mass, inflammatory  changes, or other parenchymal abnormality identified. Spleen:  Within normal limits in size and appearance. Adrenals/Urinary Tract: Numerous renal cysts cysts bilaterally, particularly large exophytic cysts arising from the inferior poles of the bilateral kidneys. There is a cyst of the lateral midportion of the right kidney with a very thickened, nodular wall and thickened internal septations (series 4, image 8). Post-contrast sequences are unfortunately significantly limited by breath motion artifact, however there does appear to be some contrast enhancement associated with mural components. No evidence of hydronephrosis. Stomach/Bowel: Descending colonic diverticulosis. Vascular/Lymphatic: No pathologically enlarged lymph nodes identified. No abdominal aortic aneurysm demonstrated. Other:  None. Musculoskeletal: No suspicious bone lesions identified. IMPRESSION: 1. Numerous renal cysts bilaterally. As seen on prior CT examination, there is an exophytic cyst of the lateral midportion of the right kidney with a very thickened, nodular wall and thickened internal septations. Post-contrast sequences on this examination are significantly limited by breath motion artifact, however there does appear to be some contrast enhancement associated with mural components. This is highly concerning for a renal cell carcinoma, however, this lesion is not a likely source of brain mass identified by MR given the absence of lymphadenopathy or other findings of metastatic disease in the abdomen. 2. As above, no evidence of lymphadenopathy or metastatic disease in the abdomen. No evidence of osseous metastatic disease on this examination which is not tailored for evaluation of the osseous structures. 3. Lesion of the posterior right lobe of the liver identified by prior CT is a benign, lobulated cyst. Electronically Signed   By: AEddie CandleM.D.   On: 09/14/2020  13:44   DG Chest Portable 1 View  Result Date: 09/12/2020 CLINICAL  DATA:  Lethargic EXAM: PORTABLE CHEST 1 VIEW COMPARISON:  03/06/2016 FINDINGS: The heart size and mediastinal contours are within normal limits. Both lungs are clear. The visualized skeletal structures are unremarkable. IMPRESSION: No active disease. Electronically Signed   By: Donavan Foil M.D.   On: 09/12/2020 18:43   EEG adult  Result Date: 09/13/2020 Lora Havens, MD     09/13/2020 10:01 AM Patient Name: Mathew Leach MRN: [id number] Epilepsy Attending: Lora Havens Referring Physician/Provider: Dr Derrick Ravel Date: 09/13/2020 Duration: 23.29 mins Patient history: y.o. 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 EEG to evaluate for seizure Level of alertness: Awake, asleep AEDs during EEG study: LEV Technical aspects: This EEG study was done with scalp electrodes positioned according to the 10-20 International system of electrode placement. Electrical activity was acquired at a sampling rate of '500Hz'  and reviewed with a high frequency filter of '70Hz'  and a low frequency filter of '1Hz' . EEG data were recorded continuously and digitally stored. Description: The posterior dominant rhythm consists of 9 Hz activity of moderate voltage (25-35 uV) seen predominantly in posterior head regions, asymmetric ( L<R) and reactive to eye opening and eye closing. Sleep was characterized by vertex waves, sleep spindles (12 to 14 Hz), maximal frontocentral region.  EEG showed continuous 3 to 5 Hz theta-delta slowing in left hemisphere, maximal left frontal region.  Sharp waves were also noted in left frontal region. 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 Physiologic photic driving was not seen during photic stimulation.  Hyperventilation was not performed.   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  No seizures were seen throughout the recording. Priyanka Barbra Sarks         Scheduled Meds: . amLODipine  10 mg Oral Daily  . dexamethasone (DECADRON) injection  4 mg Intravenous Q6H  . insulin aspart  0-15 Units Subcutaneous TID WC  . insulin aspart  0-5 Units Subcutaneous QHS  . levETIRAcetam  1,000 mg Oral BID  . simvastatin  20 mg Oral q1800   Continuous Infusions: . sodium chloride 100 mL/hr (09/15/20 0922)     LOS: 3 days    Time spent: 15 mins    Emeterio Reeve, DO [facility]  If 7PM-7AM, please contact night-coverage [email or web address] 09/16/2020, 8:44 AM

## 2020-09-16 NOTE — Progress Notes (Signed)
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 M.D.   On: 09/13/2020 13:16   MR ABDOMEN W WO CONTRAST  Result Date: 09/14/2020 CLINICAL DATA:  Brain lesion, possible renal mass identified on prior CT EXAM: MRI ABDOMEN WITHOUT AND WITH CONTRAST TECHNIQUE: Multiplanar multisequence MR imaging of the abdomen was performed both before and after the administration of intravenous contrast. CONTRAST:  62m GADAVIST GADOBUTROL 1 MMOL/ML IV SOLN COMPARISON:  CT abdomen pelvis, 09/13/2020 FINDINGS: Examination is generally limited by motion artifact throughout, particularly on postcontrast sequences. Lower chest: No acute findings. Hepatobiliary: No mass or other parenchymal abnormality identified. Nonenhancing lobulated cyst of the posterior right lobe of the liver (series 5, image 3) Pancreas: No mass, inflammatory  changes, or other parenchymal abnormality identified. Spleen:  Within normal limits in size and appearance. Adrenals/Urinary Tract: Numerous renal cysts cysts bilaterally, particularly large exophytic cysts arising from the inferior poles of the bilateral kidneys. There is a cyst of the lateral midportion of the right kidney with a very thickened, nodular wall and thickened internal septations (series 4, image 8). Post-contrast sequences are unfortunately significantly limited by breath motion artifact, however there does appear to be some contrast enhancement associated with mural components. No evidence of hydronephrosis. Stomach/Bowel: Descending colonic diverticulosis. Vascular/Lymphatic: No pathologically enlarged lymph nodes identified. No abdominal aortic aneurysm demonstrated. Other:  None. Musculoskeletal: No suspicious bone lesions identified. IMPRESSION: 1. Numerous renal cysts bilaterally. As seen on prior CT examination, there is an exophytic cyst of the lateral midportion of the right kidney with a very thickened, nodular wall and thickened internal septations. Post-contrast sequences on this examination are significantly limited by breath motion artifact, however there does appear to be some contrast enhancement associated with mural components. This is highly concerning for a renal cell carcinoma, however, this lesion is not a likely source of brain mass identified by MR given the absence of lymphadenopathy or other findings of metastatic disease in the abdomen. 2. As above, no evidence of lymphadenopathy or metastatic disease in the abdomen. No evidence of osseous metastatic disease on this examination which is not tailored for evaluation of the osseous structures. 3. Lesion of the posterior right lobe of the liver identified by prior CT is a benign, lobulated cyst. Electronically Signed   By: AEddie CandleM.D.   On: 09/14/2020 13:44  DG Chest Portable 1 View  Result Date: 09/12/2020 CLINICAL  DATA:  Lethargic EXAM: PORTABLE CHEST 1 VIEW COMPARISON:  03/06/2016 FINDINGS: The heart size and mediastinal contours are within normal limits. Both lungs are clear. The visualized skeletal structures are unremarkable. IMPRESSION: No active disease. Electronically Signed   By: Donavan Foil M.D.   On: 09/12/2020 18:43   EEG adult  Result Date: 09/13/2020 Lora Havens, MD     09/13/2020 10:01 AM Patient Name: Mathew Leach MRN: [id number] Epilepsy Attending: Lora Havens Referring Physician/Provider: Dr Derrick Ravel Date: 09/13/2020 Duration: 23.29 mins Patient history: y.o. 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 EEG to evaluate for seizure Level of alertness: Awake, asleep AEDs during EEG study: LEV Technical aspects: This EEG study was done with scalp electrodes positioned according to the 10-20 International system of electrode placement. Electrical activity was acquired at a sampling rate of _0  and reviewed with a high frequency filter of _1  and a low frequency filter of _2 . EEG data were recorded continuously and digitally stored. Description: The posterior dominant rhythm consists of 9 Hz activity of moderate voltage (25-35 uV) seen predominantly in posterior head regions, asymmetric ( L<R) and reactive to eye opening and eye closing. Sleep was characterized by vertex waves, sleep spindles (12 to 14 Hz), maximal frontocentral region.  EEG showed continuous 3 to 5 Hz theta-delta slowing in left hemisphere, maximal left frontal region.  Sharp waves were also noted in left frontal region. 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 Physiologic photic driving was not seen during photic stimulation.  Hyperventilation was not performed.   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  No seizures were seen throughout the recording. Priyanka Barbra Sarks         Scheduled Meds: . amLODipine  10 mg Oral Daily  . dexamethasone (DECADRON) injection  4 mg Intravenous Q6H  . insulin aspart  0-15 Units Subcutaneous TID WC  . insulin aspart  0-5 Units Subcutaneous QHS  . levETIRAcetam  1,000 mg Oral BID  . simvastatin  20 mg Oral q1800   Continuous Infusions: . sodium chloride 100 mL/hr (09/15/20 0922)     LOS: 3 days    Time spent: 25 mins    Emeterio Reeve, DO [facility]  If 7PM-7AM, please contact night-coverage [email or web address] 09/16/2020, 1:22 PM

## 2020-09-17 ENCOUNTER — Encounter (HOSPITAL_COMMUNITY): Payer: Self-pay | Admitting: Internal Medicine

## 2020-09-17 DIAGNOSIS — I480 Paroxysmal atrial fibrillation: Secondary | ICD-10-CM | POA: Diagnosis not present

## 2020-09-17 DIAGNOSIS — Z7189 Other specified counseling: Secondary | ICD-10-CM

## 2020-09-17 HISTORY — DX: Other specified counseling: Z71.89

## 2020-09-17 LAB — GLUCOSE, CAPILLARY
Glucose-Capillary: 118 mg/dL — ABNORMAL HIGH (ref 70–99)
Glucose-Capillary: 184 mg/dL — ABNORMAL HIGH (ref 70–99)
Glucose-Capillary: 125 mg/dL — ABNORMAL HIGH (ref 70–99)
Glucose-Capillary: 139 mg/dL — ABNORMAL HIGH (ref 70–99)

## 2020-09-17 LAB — LACTATE DEHYDROGENASE: LDH: 150 U/L (ref 98–192)

## 2020-09-17 MED ORDER — CEFAZOLIN SODIUM-DEXTROSE 2-4 GM/100ML-% IV SOLN
2.0000 g | INTRAVENOUS | Status: AC
  Administered 2020-09-18: 2 g via INTRAVENOUS

## 2020-09-17 MED ORDER — CEFAZOLIN SODIUM-DEXTROSE 2-4 GM/100ML-% IV SOLN: 2.0000 g | INTRAVENOUS | Status: DC

## 2020-09-17 MED ORDER — CHLORHEXIDINE GLUCONATE CLOTH 2 % EX PADS
6.0000 | MEDICATED_PAD | Freq: Once | CUTANEOUS | Status: AC
  Administered 2020-09-18: 6 via TOPICAL

## 2020-09-17 MED ORDER — CHLORHEXIDINE GLUCONATE CLOTH 2 % EX PADS
6.0000 | MEDICATED_PAD | Freq: Once | CUTANEOUS | Status: AC
  Administered 2020-09-17: 6 via TOPICAL

## 2020-09-17 NOTE — Progress Notes (Addendum)
  NEUROSURGERY PROGRESS NOTE   No issues overnight.  No concerns this am  EXAM:  BP 125/74 (BP Location: Left Arm)   Pulse (!) 59   Temp 98.2 F (36.8 C) (Oral)   Resp 18   Ht 5\' 8"  (1.727 m)   Wt 81 kg   SpO2 95%   BMI 27.15 kg/m   Awake, alert, oriented  Speech fluent, appropriate  CN grossly intact  5/5 BUE/BLE, minimal proximal RUE weakness  IMPRESSION/PLAN 69 y.o. 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 OR tomorrow afternoon for resection. - Pre op orders entered - NPO at midnight - Transfer to ICU post op   I have seen and examined Mathew Leach and agree with the exam, impression, and plan as documented in the note by Ferne Reus, PA-C.  Large cystic left frontal mass will require resection. Dx remains unclear, possible high-grade glioma vs. Metastasis. SZ appears to be well controlled on Keppra.   I had a lengthy discussion with the patient's wife and son today regarding the imaging findings and possible diagnoses. We discussed the need for surgery to relieve mass effect and obtain tissue diagnosis. We discussed the potential risks of surgery including postoperative speech difficulty, weakness/paralysis, risk of intraoperative bleeding, infection, worsening SZ, hydrocephalus, and perioperative risk of heart attack, stroke, and blood clots. We discussed the expected postoperative course and recovery including possible need for speech, physical, and occupational therapy based on any postoperative deficits. We reviewed the possible need for adjuvant treatment including chemotherapy and radiation therapy based on the pathology results. All their questions today were answered.  Consuella Lose, MD [facility]

## 2020-09-17 NOTE — Progress Notes (Signed)
PROGRESS NOTE    Davine Coba  FBP:[id number] DOB: 23-May-1951 DOA: 09/12/2020 PCP: Maryella Shivers, MD   Chief Complaint  Patient presents with  . Loss of Consciousness  . Seizures    Brief Narrative: 69 year old male with history of stroke, PFO, PAF, HTN, OSA presented with altered mental status and seizure.  He was seen by his coworker last around 4 PM and appeared fatigued, later was found lying down on the ground and EMS was called, had witnessed seizure during transport.  He was seen in the ED and was admitted 09/12/20, underwent further extensive work-up, seen by neurology. Found to have large cystic left frontal lesion-seen by neurosurgery planning for resection 11/2  Subjective: Alert awake this morning no new complaints. Reports he saw neurosurgery and is going for surgery tomorrow  Assessment & Plan:  New onset seizure in the setting of large cystic left frontal lesion, appreciate neurosurgery input going for resection 09/18/2028.  Continue on Keppra 100 mg BID, as per neurology.  Continue seizure precaution, n.p.o. past midnight.  Continue Decadron IV-currently at 4 mg every 6 hours per neuro. oncology waiting on pathology from brain mass to determine primary.  Possible malignant renal mass seen in the MRI abdomen-reported as exophytic cyst on the lateral midportion of the right kidney with a very thickened nodular wall thickening internal septation limited postcontrast sequences but appears to be some contrast enhancement with mobile components, concerning for renal cell carcinoma, however PER REPORT this lesion is not likely source of brain mass identified by MRI given the absence of lymphadenopathy or other findings of metastatic disease in the abdomen. Await w/i as #1 and wil ld/w oncology based on biopsy.  PAF: Currently in sinus rhythm seen by cardiology outpatient per note no clinical recurrence, watchman device in place on antiplatelet therapy with aspirin at home.   Monitor on telemetry closely.  Aspirin on hold for surgery.  Essential hypertension blood pressure is well controlled on amlodipine.  Continue same.  ACE inhibitor on hold due to AKI.  Prediabetes well-controlled A1c 5.8.  Continue sliding scale insulin  Anion gap metabolic acidosis in the setting of seizure.  Resolved AKI resolved. Recent Labs  Lab 09/12/20 1801 09/13/20 0354 09/14/20 0229 09/16/20 8242  BUN 16 DUPLICATE  15 18 24*  CREATININE 3.53* DUPLICATE  6.14 4.31 5.40    Nutrition: Diet Order            Diet NPO time specified  Diet effective midnight           Diet heart healthy/carb modified Room service appropriate? Yes; Fluid consistency: Thin  Diet effective now                  Body mass index is 27.15 kg/m.  DVT prophylaxis: Place and maintain sequential compression device Start: 09/12/20 2357 Code Status:   Code Status: Full Code  Family Communication: plan of care discussed with patient at bedside.  Status is: Inpatient  Remains inpatient appropriate because:Ongoing diagnostic testing needed not appropriate for outpatient work up, IV treatments appropriate due to intensity of illness or inability to take PO and Inpatient level of care appropriate due to severity of illness   Dispo: The patient is from: Home              Anticipated d/c is to: Home              Anticipated d/c date is: > 3 days  Patient currently is not medically stable to d/c.  Consultants:see note  Procedures:see note  Culture/Microbiology    Component Value Date/Time   SDES URINE, CLEAN CATCH 03/06/2016 1140   SPECREQUEST NONE 03/06/2016 1140   CULT NO GROWTH 1 DAY 03/06/2016 1140   REPTSTATUS 03/07/2016 FINAL 03/06/2016 1140    Other culture-see note  Medications: Scheduled Meds: . amLODipine  10 mg Oral Daily  . dexamethasone (DECADRON) injection  4 mg Intravenous Q6H  . insulin aspart  0-15 Units Subcutaneous TID WC  . insulin aspart  0-5 Units  Subcutaneous QHS  . levETIRAcetam  1,000 mg Oral BID  . simvastatin  20 mg Oral q1800   Continuous Infusions: . sodium chloride 100 mL/hr (09/15/20 2694)    Antimicrobials: Anti-infectives (From admission, onward)   None     Objective: Vitals: Today's Vitals   09/16/20 2358 09/17/20 0414 09/17/20 0717 09/17/20 0736  BP: 131/75 119/69  125/74  Pulse: 60 60  (!) 59  Resp: 18 20  18   Temp: 98.5 F (36.9 C) 98 F (36.7 C)  98.2 F (36.8 C)  TempSrc: Oral Oral  Oral  SpO2: 95% 93%  95%  Weight:      Height:      PainSc:   0-No pain     Intake/Output Summary (Last 24 hours) at 09/17/2020 0846 Last data filed at 09/16/2020 1511 Gross per 24 hour  Intake 1042 ml  Output 750 ml  Net 292 ml   Filed Weights   09/13/20 1425  Weight: 81 kg   Weight change:   Intake/Output from previous day: 10/31 0701 - 11/01 0700 In: 8546 [P.O.:660; I.V.:702] Out: 950 [Urine:950] Intake/Output this shift: No intake/output data recorded.  Examination: General exam: AAO ,NAD, weak appearing. HEENT:Oral mucosa moist, Ear/Nose WNL grossly,dentition normal. Respiratory system: bilaterally clear,no wheezing or crackles,no use of accessory muscle, non tender. Cardiovascular system: S1 & S2 +, regular, No JVD. Gastrointestinal system: Abdomen soft, NT,ND, BS+. Nervous System:Alert, awake, moving extremities and grossly nonfocal Extremities: No edema, distal peripheral pulses palpable.  Skin: No rashes,no icterus. MSK: Normal muscle bulk,tone, power  Data Reviewed: I have personally reviewed following labs and imaging studies CBC: Recent Labs  Lab 09/12/20 1822 09/14/20 0229 09/16/20 0134  WBC 9.0 19.7* 13.8*  NEUTROABS 4.5  --   --   HGB 14.8 13.6 12.9*  HCT 46.6 40.4 39.1  MCV 94.7 91.0 89.7  PLT 323 279 270   Basic Metabolic Panel: Recent Labs  Lab 09/12/20 1801 09/13/20 0354 09/14/20 0229 09/16/20 3500  NA 938 DUPLICATE  182 993 716  K 4.1 DUPLICATE  4.2 4.1 4.0  CL  967 DUPLICATE  893 810* 175  CO2 12* DUPLICATE  22 21* 21*  GLUCOSE 102* DUPLICATE  585* 277* 824*  BUN 16 DUPLICATE  15 18 24*  CREATININE 2.35* DUPLICATE  3.61 4.43 1.54  CALCIUM 9.5 DUPLICATE  9.0 8.8* 8.5*   GFR: Estimated Creatinine Clearance: 61.3 mL/min (by C-G formula based on SCr of 1.07 mg/dL). Liver Function Tests: Recent Labs  Lab 09/12/20 1801  AST 32  ALT 20  ALKPHOS 70  BILITOT 0.9  PROT 6.8  ALBUMIN 4.0   No results for input(s): LIPASE, AMYLASE in the last 168 hours. No results for input(s): AMMONIA in the last 168 hours. Coagulation Profile: Recent Labs  Lab 09/12/20 1801  INR 1.1   Cardiac Enzymes: No results for input(s): CKTOTAL, CKMB, CKMBINDEX, TROPONINI in the last 168 hours. BNP (last 3 results)  No results for input(s): PROBNP in the last 8760 hours. HbA1C: No results for input(s): HGBA1C in the last 72 hours. CBG: Recent Labs  Lab 09/16/20 0618 09/16/20 1201 09/16/20 1624 09/16/20 2159 09/17/20 0640  GLUCAP 116* 132* 130* 136* 118*   Lipid Profile: No results for input(s): CHOL, HDL, LDLCALC, TRIG, CHOLHDL, LDLDIRECT in the last 72 hours. Thyroid Function Tests: No results for input(s): TSH, T4TOTAL, FREET4, T3FREE, THYROIDAB in the last 72 hours. Anemia Panel: No results for input(s): VITAMINB12, FOLATE, FERRITIN, TIBC, IRON, RETICCTPCT in the last 72 hours. Sepsis Labs: Recent Labs  Lab 09/13/20 0354  LATICACIDVEN 1.6    Recent Results (from the past 240 hour(s))  Respiratory Panel by RT PCR (Flu A&B, Covid) - Nasopharyngeal Swab     Status: None   Collection Time: 09/12/20 10:11 PM   Specimen: Nasopharyngeal Swab  Result Value Ref Range Status   SARS Coronavirus 2 by RT PCR NEGATIVE NEGATIVE Final    Comment: (NOTE) SARS-CoV-2 target nucleic acids are NOT DETECTED.  The SARS-CoV-2 RNA is generally detectable in upper respiratoy specimens during the acute phase of infection. The lowest concentration of SARS-CoV-2  viral copies this assay can detect is 131 copies/mL. A negative result does not preclude SARS-Cov-2 infection and should not be used as the sole basis for treatment or other patient management decisions. A negative result may occur with  improper specimen collection/handling, submission of specimen other than nasopharyngeal swab, presence of viral mutation(s) within the areas targeted by this assay, and inadequate number of viral copies (<131 copies/mL). A negative result must be combined with clinical observations, patient history, and epidemiological information. The expected result is Negative.  Fact Sheet for Patients:  PinkCheek.be  Fact Sheet for Healthcare Providers:  GravelBags.it  This test is no t yet approved or cleared by the [place] FDA and  has been authorized for detection and/or diagnosis of SARS-CoV-2 by FDA under an Emergency Use Authorization (EUA). This EUA will remain  in effect (meaning this test can be used) for the duration of the COVID-19 declaration under Section 564(b)(1) of the Act, 21 U.S.C. section 360bbb-3(b)(1), unless the authorization is terminated or revoked sooner.     Influenza A by PCR NEGATIVE NEGATIVE Final   Influenza B by PCR NEGATIVE NEGATIVE Final    Comment: (NOTE) The Xpert Xpress SARS-CoV-2/FLU/RSV assay is intended as an aid in  the diagnosis of influenza from Nasopharyngeal swab specimens and  should not be used as a sole basis for treatment. Nasal washings and  aspirates are unacceptable for Xpert Xpress SARS-CoV-2/FLU/RSV  testing.  Fact Sheet for Patients: PinkCheek.be  Fact Sheet for Healthcare Providers: GravelBags.it  This test is not yet approved or cleared by the [place] FDA and  has been authorized for detection and/or diagnosis of SARS-CoV-2 by  FDA under an Emergency Use Authorization (EUA).  This EUA will remain  in effect (meaning this test can be used) for the duration of the  Covid-19 declaration under Section 564(b)(1) of the Act, 21  U.S.C. section 360bbb-3(b)(1), unless the authorization is  terminated or revoked. Performed at [facility], [place] [address] [place], [place] [address]   MRSA PCR Screening     Status: None   Collection Time: 09/15/20  9:27 AM   Specimen: Nasopharyngeal  Result Value Ref Range Status   MRSA by PCR NEGATIVE NEGATIVE Final    Comment:        The GeneXpert MRSA Assay (FDA approved for  NASAL specimens only), is one component of a comprehensive MRSA colonization surveillance program. It is not intended to diagnose MRSA infection nor to guide or monitor treatment for MRSA infections. Performed at [facility], [place] [address], [place], [place] [address]      Radiology Studies: No results found.   LOS: 4 days   Antonieta Pert, MD [facility]  09/17/2020, 8:46 AM

## 2020-09-18 ENCOUNTER — Encounter (HOSPITAL_COMMUNITY)
Admission: EM | Disposition: A | Discharge status: Home Health | Payer: Self-pay | Source: Home / Self Care | Attending: Internal Medicine

## 2020-09-18 ENCOUNTER — Inpatient Hospital Stay (HOSPITAL_COMMUNITY): Payer: Medicare Other | Admitting: Certified Registered"

## 2020-09-18 ENCOUNTER — Encounter (HOSPITAL_COMMUNITY): Payer: Self-pay | Admitting: Internal Medicine

## 2020-09-18 DIAGNOSIS — I480 Paroxysmal atrial fibrillation: Secondary | ICD-10-CM | POA: Diagnosis not present

## 2020-09-18 HISTORY — PX: CRANIOTOMY: SHX93

## 2020-09-18 HISTORY — PX: APPLICATION OF CRANIAL NAVIGATION: SHX6578

## 2020-09-18 LAB — GLUCOSE, CAPILLARY
Glucose-Capillary: 111 mg/dL — ABNORMAL HIGH (ref 70–99)
Glucose-Capillary: 122 mg/dL — ABNORMAL HIGH (ref 70–99)
Glucose-Capillary: 201 mg/dL — ABNORMAL HIGH (ref 70–99)

## 2020-09-18 LAB — LACTATE DEHYDROGENASE: LDH: 170 U/L (ref 98–192)

## 2020-09-18 LAB — TYPE AND SCREEN
ABO/RH(D): O POS
Antibody Screen: NEGATIVE

## 2020-09-18 SURGERY — CRANIOTOMY TUMOR EXCISION
Anesthesia: General | Site: Head | Laterality: Left

## 2020-09-18 MED ORDER — BACITRACIN ZINC 500 UNIT/GM EX OINT
TOPICAL_OINTMENT | CUTANEOUS | Status: DC | PRN
  Administered 2020-09-18: 1 via TOPICAL

## 2020-09-18 MED ORDER — PANTOPRAZOLE SODIUM 40 MG IV SOLR
40.0000 mg | Freq: Every day | INTRAVENOUS | Status: DC
  Administered 2020-09-18: 40 mg via INTRAVENOUS
  Filled 2020-09-18: qty 40

## 2020-09-18 MED ORDER — FENTANYL CITRATE (PF) 250 MCG/5ML IJ SOLN
INTRAMUSCULAR | Status: DC | PRN
  Administered 2020-09-18: 50 ug via INTRAVENOUS
  Administered 2020-09-18: 100 ug via INTRAVENOUS

## 2020-09-18 MED ORDER — THROMBIN 5000 UNITS EX SOLR
OROMUCOSAL | Status: DC | PRN
  Administered 2020-09-18: 5 mL via TOPICAL

## 2020-09-18 MED ORDER — LACTATED RINGERS IV SOLN: INTRAVENOUS | Status: DC | PRN

## 2020-09-18 MED ORDER — LIDOCAINE 2% (20 MG/ML) 5 ML SYRINGE
INTRAMUSCULAR | Status: DC | PRN
  Administered 2020-09-18: 100 mg via INTRAVENOUS

## 2020-09-18 MED ORDER — HEMOSTATIC AGENTS (NO CHARGE) OPTIME
TOPICAL | Status: DC | PRN
  Administered 2020-09-18 (×2): 1 via TOPICAL

## 2020-09-18 MED ORDER — LEVETIRACETAM IN NACL 1000 MG/100ML IV SOLN
1000.0000 mg | INTRAVENOUS | Status: DC
  Filled 2020-09-18: qty 100

## 2020-09-18 MED ORDER — LIDOCAINE-EPINEPHRINE 1 %-1:100000 IJ SOLN
INTRAMUSCULAR | Status: AC
  Filled 2020-09-18: qty 1

## 2020-09-18 MED ORDER — ROCURONIUM BROMIDE 10 MG/ML (PF) SYRINGE
PREFILLED_SYRINGE | INTRAVENOUS | Status: DC | PRN
  Administered 2020-09-18: 70 mg via INTRAVENOUS
  Administered 2020-09-18: 30 mg via INTRAVENOUS
  Administered 2020-09-18: 20 mg via INTRAVENOUS

## 2020-09-18 MED ORDER — PROPOFOL 10 MG/ML IV BOLUS
INTRAVENOUS | Status: AC
  Filled 2020-09-18: qty 20

## 2020-09-18 MED ORDER — THROMBIN 5000 UNITS EX SOLR
CUTANEOUS | Status: AC
  Filled 2020-09-18: qty 5000

## 2020-09-18 MED ORDER — 0.9 % SODIUM CHLORIDE (POUR BTL) OPTIME
TOPICAL | Status: DC | PRN
  Administered 2020-09-18 (×3): 1000 mL

## 2020-09-18 MED ORDER — THROMBIN 20000 UNITS EX SOLR
CUTANEOUS | Status: AC
  Filled 2020-09-18: qty 20000

## 2020-09-18 MED ORDER — CHLORHEXIDINE GLUCONATE 0.12 % MT SOLN
15.0000 mL | Freq: Once | OROMUCOSAL | Status: AC
  Administered 2020-09-18: 15 mL via OROMUCOSAL
  Filled 2020-09-18: qty 15

## 2020-09-18 MED ORDER — MICROFIBRILLAR COLL HEMOSTAT EX PADS
MEDICATED_PAD | CUTANEOUS | Status: DC | PRN
  Administered 2020-09-18: 1 via TOPICAL

## 2020-09-18 MED ORDER — ONDANSETRON HCL 4 MG/2ML IJ SOLN: 4.0000 mg | INTRAMUSCULAR | Status: DC | PRN

## 2020-09-18 MED ORDER — FENTANYL CITRATE (PF) 250 MCG/5ML IJ SOLN
INTRAMUSCULAR | Status: AC
  Filled 2020-09-18: qty 5

## 2020-09-18 MED ORDER — SODIUM CHLORIDE 0.9 % IV SOLN: INTRAVENOUS | Status: DC | PRN

## 2020-09-18 MED ORDER — MIDAZOLAM HCL 5 MG/5ML IJ SOLN
INTRAMUSCULAR | Status: DC | PRN
  Administered 2020-09-18: 2 mg via INTRAVENOUS

## 2020-09-18 MED ORDER — PHENYLEPHRINE HCL-NACL 10-0.9 MG/250ML-% IV SOLN
INTRAVENOUS | Status: DC | PRN
  Administered 2020-09-18: 20 ug/min via INTRAVENOUS

## 2020-09-18 MED ORDER — HYDROMORPHONE HCL 1 MG/ML IJ SOLN
INTRAMUSCULAR | Status: AC
  Filled 2020-09-18: qty 0.5

## 2020-09-18 MED ORDER — ONDANSETRON HCL 4 MG PO TABS: 4.0000 mg | ORAL_TABLET | ORAL | Status: DC | PRN

## 2020-09-18 MED ORDER — HYDROMORPHONE HCL 1 MG/ML IJ SOLN
INTRAMUSCULAR | Status: DC | PRN
  Administered 2020-09-18: .5 mg via INTRAVENOUS

## 2020-09-18 MED ORDER — CEFAZOLIN SODIUM-DEXTROSE 2-4 GM/100ML-% IV SOLN
2.0000 g | Freq: Three times a day (TID) | INTRAVENOUS | Status: AC
  Administered 2020-09-18 – 2020-09-19 (×2): 2 g via INTRAVENOUS
  Filled 2020-09-18 (×2): qty 100

## 2020-09-18 MED ORDER — MIDAZOLAM HCL 2 MG/2ML IJ SOLN
INTRAMUSCULAR | Status: AC
  Filled 2020-09-18: qty 2

## 2020-09-18 MED ORDER — BUPIVACAINE HCL (PF) 0.5 % IJ SOLN
INTRAMUSCULAR | Status: DC | PRN
  Administered 2020-09-18: 4 mL

## 2020-09-18 MED ORDER — SODIUM CHLORIDE 0.9 % IV SOLN
0.0125 ug/kg/min | INTRAVENOUS | Status: AC
  Administered 2020-09-18: .1 ug/kg/min via INTRAVENOUS
  Filled 2020-09-18 (×2): qty 2000

## 2020-09-18 MED ORDER — LIDOCAINE-EPINEPHRINE 1 %-1:100000 IJ SOLN
INTRAMUSCULAR | Status: DC | PRN
  Administered 2020-09-18: 4 mL

## 2020-09-18 MED ORDER — LABETALOL HCL 5 MG/ML IV SOLN
10.0000 mg | INTRAVENOUS | Status: DC | PRN
  Administered 2020-09-19: 10 mg via INTRAVENOUS
  Filled 2020-09-18: qty 4

## 2020-09-18 MED ORDER — NALOXONE HCL 0.4 MG/ML IJ SOLN: 0.0800 mg | INTRAMUSCULAR | Status: DC | PRN

## 2020-09-18 MED ORDER — HYDROMORPHONE HCL 1 MG/ML IJ SOLN
0.5000 mg | INTRAMUSCULAR | Status: DC | PRN
  Filled 2020-09-18: qty 1

## 2020-09-18 MED ORDER — BUPIVACAINE HCL (PF) 0.5 % IJ SOLN
INTRAMUSCULAR | Status: AC
  Filled 2020-09-18: qty 30

## 2020-09-18 MED ORDER — SODIUM CHLORIDE 0.9 % IV SOLN
INTRAVENOUS | Status: DC | PRN
  Administered 2020-09-18: 1000 mg via INTRAVENOUS

## 2020-09-18 MED ORDER — BACITRACIN ZINC 500 UNIT/GM EX OINT
TOPICAL_OINTMENT | CUTANEOUS | Status: AC
  Filled 2020-09-18: qty 28.35

## 2020-09-18 MED ORDER — DEXAMETHASONE SODIUM PHOSPHATE 10 MG/ML IJ SOLN
INTRAMUSCULAR | Status: DC | PRN
  Administered 2020-09-18: 4 mg via INTRAVENOUS

## 2020-09-18 MED ORDER — PROPOFOL 10 MG/ML IV BOLUS
INTRAVENOUS | Status: DC | PRN
  Administered 2020-09-18: 150 mg via INTRAVENOUS
  Administered 2020-09-18: 50 mg via INTRAVENOUS

## 2020-09-18 MED ORDER — SODIUM CHLORIDE 0.9 % IV SOLN: INTRAVENOUS | Status: DC

## 2020-09-18 MED ORDER — THROMBIN 20000 UNITS EX SOLR
CUTANEOUS | Status: DC | PRN
  Administered 2020-09-18: 20 mL via TOPICAL

## 2020-09-18 MED ORDER — SUGAMMADEX SODIUM 200 MG/2ML IV SOLN
INTRAVENOUS | Status: DC | PRN
  Administered 2020-09-18: 200 mg via INTRAVENOUS

## 2020-09-18 MED ORDER — PROMETHAZINE HCL 25 MG PO TABS: 12.5000 mg | ORAL_TABLET | ORAL | Status: DC | PRN

## 2020-09-18 MED ORDER — CHLORHEXIDINE GLUCONATE CLOTH 2 % EX PADS
6.0000 | MEDICATED_PAD | Freq: Every day | CUTANEOUS | Status: DC
  Administered 2020-09-18 – 2020-09-21 (×4): 6 via TOPICAL

## 2020-09-18 SURGICAL SUPPLY — 118 items
APL SKNCLS STERI-STRIP NONHPOA (GAUZE/BANDAGES/DRESSINGS)
BAND INSRT 18 STRL LF DISP RB (MISCELLANEOUS)
BAND RUBBER #18 3X1/16 STRL (MISCELLANEOUS) IMPLANT
BENZOIN TINCTURE PRP APPL 2/3 (GAUZE/BANDAGES/DRESSINGS) IMPLANT
BLADE CLIPPER SURG (BLADE) ×4 IMPLANT
BLADE SAW GIGLI 16 STRL (MISCELLANEOUS) IMPLANT
BLADE SURG 15 STRL LF DISP TIS (BLADE) IMPLANT
BLADE SURG 15 STRL SS (BLADE)
BLADE ULTRA TIP 2M (BLADE) ×2 IMPLANT
BNDG CMPR 75X41 PLY ABS (GAUZE/BANDAGES/DRESSINGS) ×2
BNDG CMPR 75X41 PLY HI ABS (GAUZE/BANDAGES/DRESSINGS)
BNDG GAUZE ELAST 4 BULKY (GAUZE/BANDAGES/DRESSINGS) ×2 IMPLANT
BNDG STRETCH 4X75 NS LF (GAUZE/BANDAGES/DRESSINGS) ×2 IMPLANT
BNDG STRETCH 4X75 STRL LF (GAUZE/BANDAGES/DRESSINGS) IMPLANT
BUR ACORN 6.0 PRECISION (BURR) ×3 IMPLANT
BUR ACORN 6.0MM PRECISION (BURR) ×1
BUR ROUND FLUTED 4 SOFT TCH (BURR) IMPLANT
BUR ROUND FLUTED 4MM SOFT TCH (BURR)
BUR SPIRAL ROUTER 2.3 (BUR) ×3 IMPLANT
BUR SPIRAL ROUTER 2.3MM (BUR) ×1
CANISTER SUCT 3000ML PPV (MISCELLANEOUS) ×8 IMPLANT
CARTRIDGE OIL MAESTRO DRILL (MISCELLANEOUS) ×2 IMPLANT
CATH VENTRIC 35X38 W/TROCAR LG (CATHETERS) IMPLANT
CLIP VESOCCLUDE MED 6/CT (CLIP) IMPLANT
CNTNR URN SCR LID CUP LEK RST (MISCELLANEOUS) ×2 IMPLANT
CONT SPEC 4OZ CLIKSEAL STRL BL (MISCELLANEOUS) ×2 IMPLANT
CONT SPEC 4OZ STRL OR WHT (MISCELLANEOUS) ×4
COVER BURR HOLE 7 (Orthopedic Implant) ×6 IMPLANT
COVER BURR HOLE UNIV 10 (Orthopedic Implant) ×2 IMPLANT
COVER MAYO STAND STRL (DRAPES) IMPLANT
COVER WAND RF STERILE (DRAPES) ×2 IMPLANT
DECANTER SPIKE VIAL GLASS SM (MISCELLANEOUS) ×4 IMPLANT
DIFFUSER DRILL AIR PNEUMATIC (MISCELLANEOUS) ×4 IMPLANT
DRAIN SUBARACHNOID (WOUND CARE) IMPLANT
DRAPE HALF SHEET 40X57 (DRAPES) ×4 IMPLANT
DRAPE MICROSCOPE LEICA (MISCELLANEOUS) IMPLANT
DRAPE NEUROLOGICAL W/INCISE (DRAPES) ×4 IMPLANT
DRAPE SHEET LG 3/4 BI-LAMINATE (DRAPES) ×2 IMPLANT
DRAPE STERI IOBAN 125X83 (DRAPES) IMPLANT
DRAPE SURG 17X23 STRL (DRAPES) IMPLANT
DRAPE WARM FLUID 44X44 (DRAPES) ×4 IMPLANT
DRSG ADAPTIC 3X8 NADH LF (GAUZE/BANDAGES/DRESSINGS) IMPLANT
DRSG TELFA 3X8 NADH (GAUZE/BANDAGES/DRESSINGS) ×4 IMPLANT
DURAPREP 6ML APPLICATOR 50/CS (WOUND CARE) ×4 IMPLANT
ELECT REM PT RETURN 9FT ADLT (ELECTROSURGICAL) ×4
ELECTRODE REM PT RTRN 9FT ADLT (ELECTROSURGICAL) ×2 IMPLANT
EVACUATOR 1/8 PVC DRAIN (DRAIN) IMPLANT
EVACUATOR SILICONE 100CC (DRAIN) IMPLANT
FORCEPS BIPOLAR SPETZLER 8 1.0 (NEUROSURGERY SUPPLIES) ×4 IMPLANT
GAUZE 4X4 16PLY RFD (DISPOSABLE) IMPLANT
GAUZE SPONGE 4X4 12PLY STRL (GAUZE/BANDAGES/DRESSINGS) ×4 IMPLANT
GLOVE BIO SURGEON STRL SZ7.5 (GLOVE) ×2 IMPLANT
GLOVE BIOGEL PI IND STRL 7.0 (GLOVE) IMPLANT
GLOVE BIOGEL PI IND STRL 7.5 (GLOVE) ×4 IMPLANT
GLOVE BIOGEL PI INDICATOR 7.0 (GLOVE) ×2
GLOVE BIOGEL PI INDICATOR 7.5 (GLOVE) ×8
GLOVE ECLIPSE 7.0 STRL STRAW (GLOVE) ×8 IMPLANT
GLOVE EXAM NITRILE XL STR (GLOVE) IMPLANT
GLOVE SURG SS PI 7.0 STRL IVOR (GLOVE) ×4 IMPLANT
GOWN STRL REUS W/ TWL LRG LVL3 (GOWN DISPOSABLE) ×4 IMPLANT
GOWN STRL REUS W/ TWL XL LVL3 (GOWN DISPOSABLE) IMPLANT
GOWN STRL REUS W/TWL 2XL LVL3 (GOWN DISPOSABLE) IMPLANT
GOWN STRL REUS W/TWL LRG LVL3 (GOWN DISPOSABLE) ×12
GOWN STRL REUS W/TWL XL LVL3 (GOWN DISPOSABLE)
GRAFT DURAGEN MATRIX 3WX3L (Graft) ×8 IMPLANT
GRAFT DURAGEN MATRIX 3X3 SNGL (Graft) IMPLANT
HEMOSTAT POWDER KIT SURGIFOAM (HEMOSTASIS) ×4 IMPLANT
HEMOSTAT SURGICEL 2X14 (HEMOSTASIS) ×4 IMPLANT
HOOK DURA 1/2IN (MISCELLANEOUS) ×4 IMPLANT
IV NS 1000ML (IV SOLUTION)
IV NS 1000ML BAXH (IV SOLUTION) ×2 IMPLANT
KIT BASIN OR (CUSTOM PROCEDURE TRAY) ×4 IMPLANT
KIT DRAIN CSF ACCUDRAIN (MISCELLANEOUS) IMPLANT
KIT TURNOVER KIT B (KITS) ×4 IMPLANT
KNIFE ARACHNOID DISP AM-24-S (MISCELLANEOUS) ×2 IMPLANT
MARKER SPHERE PSV REFLC 13MM (MARKER) ×8 IMPLANT
NDL SPNL 18GX3.5 QUINCKE PK (NEEDLE) IMPLANT
NEEDLE HYPO 22GX1.5 SAFETY (NEEDLE) ×4 IMPLANT
NEEDLE SPNL 18GX3.5 QUINCKE PK (NEEDLE) IMPLANT
NS IRRIG 1000ML POUR BTL (IV SOLUTION) ×12 IMPLANT
OIL CARTRIDGE MAESTRO DRILL (MISCELLANEOUS) ×4
PACK BATTERY CMF DISP FOR DVR (ORTHOPEDIC DISPOSABLE SUPPLIES) ×2 IMPLANT
PACK CRANIOTOMY CUSTOM (CUSTOM PROCEDURE TRAY) ×4 IMPLANT
PAD DRESSING TELFA 3X8 NADH (GAUZE/BANDAGES/DRESSINGS) IMPLANT
PATTIES SURGICAL .25X.25 (GAUZE/BANDAGES/DRESSINGS) IMPLANT
PATTIES SURGICAL .5 X.5 (GAUZE/BANDAGES/DRESSINGS) IMPLANT
PATTIES SURGICAL .5 X3 (DISPOSABLE) ×2 IMPLANT
PATTIES SURGICAL 1/4 X 3 (GAUZE/BANDAGES/DRESSINGS) IMPLANT
PATTIES SURGICAL 1X1 (DISPOSABLE) ×2 IMPLANT
PIN MAYFIELD SKULL DISP (PIN) ×4 IMPLANT
SCREW UNIII AXS SD 1.5X4 (Screw) ×38 IMPLANT
SEALANT ADHERUS EXTEND TIP (MISCELLANEOUS) ×2 IMPLANT
SET CARTRIDGE AND TUBING (SET/KITS/TRAYS/PACK) ×2 IMPLANT
SPECIMEN JAR SMALL (MISCELLANEOUS) IMPLANT
SPONGE NEURO XRAY DETECT 1X3 (DISPOSABLE) ×2 IMPLANT
SPONGE SURGIFOAM ABS GEL 100 (HEMOSTASIS) ×4 IMPLANT
STAPLER VISISTAT 35W (STAPLE) ×6 IMPLANT
STOCKINETTE 6  STRL (DRAPES) ×4
STOCKINETTE 6 STRL (DRAPES) IMPLANT
SUT ETHILON 3 0 FSL (SUTURE) IMPLANT
SUT ETHILON 3 0 PS 1 (SUTURE) IMPLANT
SUT NURALON 4 0 TR CR/8 (SUTURE) ×8 IMPLANT
SUT SILK 0 TIES 10X30 (SUTURE) IMPLANT
SUT VIC AB 0 CT1 18XCR BRD8 (SUTURE) ×4 IMPLANT
SUT VIC AB 0 CT1 8-18 (SUTURE) ×4
SUT VIC AB 3-0 SH 8-18 (SUTURE) ×6 IMPLANT
SUT VICRYL 3-0 CR8 SH (SUTURE) ×4 IMPLANT
TAPE CLOTH 1X10 TAN NS (GAUZE/BANDAGES/DRESSINGS) ×2 IMPLANT
TIP STANDARD 36KHZ (INSTRUMENTS) ×4
TIP STD 36KHZ (INSTRUMENTS) IMPLANT
TOWEL GREEN STERILE (TOWEL DISPOSABLE) ×4 IMPLANT
TOWEL GREEN STERILE FF (TOWEL DISPOSABLE) ×4 IMPLANT
TRAY FOLEY MTR SLVR 16FR STAT (SET/KITS/TRAYS/PACK) ×4 IMPLANT
TUBE CONNECTING 12'X1/4 (SUCTIONS) ×1
TUBE CONNECTING 12X1/4 (SUCTIONS) ×3 IMPLANT
UNDERPAD 30X36 HEAVY ABSORB (UNDERPADS AND DIAPERS) ×2 IMPLANT
WATER STERILE IRR 1000ML POUR (IV SOLUTION) ×4 IMPLANT
WRENCH TORQUE 36KHZ (INSTRUMENTS) ×2 IMPLANT

## 2020-09-18 NOTE — Anesthesia Postprocedure Evaluation (Signed)
Anesthesia Post Note  Patient: Mathew Leach  Procedure(s) Performed: LEFT FRONTAL CRANIOTOMY FOR  TUMOR (Left Head) APPLICATION OF CRANIAL NAVIGATION (Left )     Patient location during evaluation: PACU Anesthesia Type: General Level of consciousness: awake and alert Pain management: pain level controlled Vital Signs Assessment: post-procedure vital signs reviewed and stable Respiratory status: spontaneous breathing, nonlabored ventilation, respiratory function stable and patient connected to nasal cannula oxygen Cardiovascular status: blood pressure returned to baseline and stable Postop Assessment: no apparent nausea or vomiting Anesthetic complications: no   No complications documented.  Last Vitals:  Vitals:   09/18/20 1729 09/18/20 1730  BP: 115/72   Pulse: 72 72  Resp: 13 11  Temp:    SpO2: 92% 92%    Last Pain:  Vitals:   09/18/20 1730  TempSrc:   PainSc: Asleep                 Reace Breshears S

## 2020-09-18 NOTE — Transfer of Care (Signed)
Immediate Anesthesia Transfer of Care Note  Patient: Mathew Leach  Procedure(s) Performed: LEFT FRONTAL CRANIOTOMY FOR  TUMOR (Left Head) APPLICATION OF CRANIAL NAVIGATION (Left )  Patient Location: PACU  Anesthesia Type:General  Level of Consciousness: awake, alert  and oriented  Airway & Oxygen Therapy: Patient Spontanous Breathing and Patient connected to face mask oxygen  Post-op Assessment: Report given to RN, Post -op Vital signs reviewed and stable and Patient moving all extremities  Post vital signs: Reviewed and stable  Last Vitals:  Vitals Value Taken Time  BP 126/74 09/18/20 1658  Temp    Pulse 81 09/18/20 1702  Resp 13 09/18/20 1702  SpO2 95 % 09/18/20 1702  Vitals shown include unvalidated device data.  Last Pain:  Vitals:   09/18/20 0740  TempSrc: Oral  PainSc:          Complications: No complications documented.

## 2020-09-18 NOTE — Anesthesia Procedure Notes (Signed)
Procedure Name: Intubation Date/Time: 09/18/2020 1:09 PM Performed by: Amadeo Garnet, CRNA Pre-anesthesia Checklist: Patient identified, Emergency Drugs available, Suction available and Patient being monitored Patient Re-evaluated:Patient Re-evaluated prior to induction Oxygen Delivery Method: Circle system utilized Preoxygenation: Pre-oxygenation with 100% oxygen Induction Type: IV induction Ventilation: Mask ventilation without difficulty Laryngoscope Size: Mac and 4 Grade View: Grade I Tube type: Oral Tube size: 7.5 mm Number of attempts: 1 Airway Equipment and Method: Stylet and Oral airway Placement Confirmation: ETT inserted through vocal cords under direct vision,  positive ETCO2 and breath sounds checked- equal and bilateral Secured at: 22 cm Tube secured with: Tape Dental Injury: Teeth and Oropharynx as per pre-operative assessment

## 2020-09-18 NOTE — Progress Notes (Signed)
PROGRESS NOTE    Mathew Leach  CHY:[id number] DOB: 01/21/1951 DOA: 09/12/2020 PCP: Maryella Shivers, MD   Chief Complaint  Patient presents with  . Loss of Consciousness  . Seizures    Brief Narrative: 69 year old male with history of stroke, PFO, PAF, HTN, OSA presented with altered mental status and seizure.  He was seen by his coworker last around 4 PM and appeared fatigued, later was found lying down on the ground and EMS was called, had witnessed seizure during transport.  He was seen in the ED and was admitted 09/12/20, underwent further extensive work-up, seen by neurology. Found to have large cystic left frontal lesion-seen by neurosurgery planning for resection 11/2  Subjective: Seen this morning.  Alert awake oriented.  Patient denies any new complaints no headache, no focal weakness.  Assessment & Plan:  New onset seizure in the setting of large cystic left frontal lesion:Continue on Keppra 1000 mg BID, as per neurology.  Continue seizure precaution, Decadron-currently at 4 mg every 6 hours per neuro. Oncology waiting on pathology from brain mass to determine primary.  Going for brain mass resection today  Possible malignant renal mass seen in the MRI abdomen-reported as exophytic cyst on the lateral midportion of the right kidney with a very thickened nodular wall thickening internal septation limited postcontrast sequences but appears to be some contrast enhancement with mobile components, concerning for renal cell carcinoma, however PER REPORT this lesion is not likely source of brain mass identified by MRI given the absence of lymphadenopathy or other findings of metastatic disease in the abdomen. Await work-up as #1 and will discuss with oncology.    PAF: Currently in sinus rhythm seen by cardiology outpatient per note no clinical recurrence, watchman device in place on antiplatelet therapy with aspirin at home.  Monitor on telemetry, aspirin on hold for surgery.    Essential hypertension controlled on amlodipine.ACE inhibitor on hold due to AKI.  Prediabetes well-controlled A1c 5.8.  Continue sliding scale insulin  Anion gap metabolic acidosis in the setting of seizure.  Resolved AKI resolved.  Monitor kidney function. Recent Labs  Lab 09/12/20 1801 09/13/20 0354 09/14/20 0229 09/16/20 8786  BUN 16 DUPLICATE  15 18 24*  CREATININE 7.67* DUPLICATE  2.09 4.70 9.62   Nutrition: Diet Order            Diet NPO time specified  Diet effective midnight                  Body mass index is 27.16 kg/m.  DVT prophylaxis: SCD's Start: 09/17/20 1915 SCD's Start: 09/17/20 1915 Place and maintain sequential compression device Start: 09/12/20 2357 Code Status:   Code Status: Full Code  Family Communication: plan of care discussed with patient at bedside.  Status is: Inpatient  Remains inpatient appropriate because:Ongoing diagnostic testing needed not appropriate for outpatient work up, IV treatments appropriate due to intensity of illness or inability to take PO and Inpatient level of care appropriate due to severity of illness   Dispo: The patient is from: Home              Anticipated d/c is to: Home              Anticipated d/c date is: > 3 days              Patient currently is not medically stable to d/c.  Consultants:see note  Procedures:see note  Culture/Microbiology    Component Value Date/Time   SDES URINE, CLEAN  CATCH 03/06/2016 1140   SPECREQUEST NONE 03/06/2016 1140   CULT NO GROWTH 1 DAY 03/06/2016 1140   REPTSTATUS 03/07/2016 FINAL 03/06/2016 1140    Other culture-see note  Medications: Scheduled Meds: . [MAR Hold] amLODipine  10 mg Oral Daily  . chlorhexidine  15 mL Mouth/Throat Once  . [MAR Hold] dexamethasone (DECADRON) injection  4 mg Intravenous Q6H  . [MAR Hold] insulin aspart  0-15 Units Subcutaneous TID WC  . [MAR Hold] insulin aspart  0-5 Units Subcutaneous QHS  . [MAR Hold] levETIRAcetam  1,000 mg  Oral BID  . remifentanil (ULTIVA) 2 mg in 100 mL normal saline (20 mcg/mL) Optime  0.0125 mcg/kg/min Intravenous To OR  . [MAR Hold] simvastatin  20 mg Oral q1800   Continuous Infusions: . sodium chloride Stopped (09/17/20 0918)  . sodium chloride    .  ceFAZolin (ANCEF) IV      Antimicrobials: Anti-infectives (From admission, onward)   Start     Dose/Rate Route Frequency Ordered Stop   09/18/20 1145  ceFAZolin (ANCEF) IVPB 2g/100 mL premix  Status:  Discontinued        2 g 200 mL/hr over 30 Minutes Intravenous 30 min pre-op 09/17/20 1914 09/17/20 1919   09/18/20 1145  ceFAZolin (ANCEF) IVPB 2g/100 mL premix        2 g 200 mL/hr over 30 Minutes Intravenous On call to O.R. 09/17/20 1914 09/19/20 0559     Objective: Vitals: Today's Vitals   09/18/20 0409 09/18/20 0710 09/18/20 0740 09/18/20 1227  BP: 111/67  111/72   Pulse: (!) 54  (!) 54   Resp: 20 (!) 23 18   Temp: 98 F (36.7 C)  97.7 F (36.5 C)   TempSrc: Oral  Oral   SpO2: 97%  95%   Weight:    81 kg  Height:    5' 7.99" (1.727 m)  PainSc:  2       Intake/Output Summary (Last 24 hours) at 09/18/2020 1237 Last data filed at 09/17/2020 2134 Gross per 24 hour  Intake 560 ml  Output 150 ml  Net 410 ml   Filed Weights   09/13/20 1425 09/18/20 1227  Weight: 81 kg 81 kg   Weight change:   Intake/Output from previous day: 11/01 0701 - 11/02 0700 In: 880 [P.O.:880] Out: 150 [Urine:150] Intake/Output this shift: No intake/output data recorded.  Examination: General exam: AAO,NAD, weak appearing. HEENT:Oral mucosa moist, Ear/Nose WNL grossly, dentition normal. Respiratory system: bilaterally clear,no wheezing or crackles,no use of accessory muscle Cardiovascular system: S1 & S2 +, No JVD,. Gastrointestinal system: Abdomen soft, NT,ND, BS+ Nervous System:Alert, awake, moving extremities and grossly nonfocal Extremities: No edema, distal peripheral pulses palpable.  Skin: No rashes,no icterus. MSK: Normal muscle  bulk,tone, power  Data Reviewed: I have personally reviewed following labs and imaging studies CBC: Recent Labs  Lab 09/12/20 1822 09/14/20 0229 09/16/20 0134  WBC 9.0 19.7* 13.8*  NEUTROABS 4.5  --   --   HGB 14.8 13.6 12.9*  HCT 46.6 40.4 39.1  MCV 94.7 91.0 89.7  PLT 323 279 093   Basic Metabolic Panel: Recent Labs  Lab 09/12/20 1801 09/13/20 0354 09/14/20 0229 09/16/20 2355  NA 732 DUPLICATE  202 542 706  K 4.1 DUPLICATE  4.2 4.1 4.0  CL 237 DUPLICATE  628 315* 176  CO2 12* DUPLICATE  22 21* 21*  GLUCOSE 160* DUPLICATE  737* 106* 269*  BUN 16 DUPLICATE  15 18 24*  CREATININE 4.85* DUPLICATE  4.62  1.15 1.07  CALCIUM 9.5 DUPLICATE  9.0 8.8* 8.5*   GFR: Estimated Creatinine Clearance: 61.3 mL/min (by C-G formula based on SCr of 1.07 mg/dL). Liver Function Tests: Recent Labs  Lab 09/12/20 1801  AST 32  ALT 20  ALKPHOS 70  BILITOT 0.9  PROT 6.8  ALBUMIN 4.0   No results for input(s): LIPASE, AMYLASE in the last 168 hours. No results for input(s): AMMONIA in the last 168 hours. Coagulation Profile: Recent Labs  Lab 09/12/20 1801  INR 1.1   Cardiac Enzymes: No results for input(s): CKTOTAL, CKMB, CKMBINDEX, TROPONINI in the last 168 hours. BNP (last 3 results) No results for input(s): PROBNP in the last 8760 hours. HbA1C: No results for input(s): HGBA1C in the last 72 hours. CBG: Recent Labs  Lab 09/17/20 1125 09/17/20 1628 09/17/20 2057 09/18/20 0621 09/18/20 1205  GLUCAP 184* 125* 139* 111* 122*   Lipid Profile: No results for input(s): CHOL, HDL, LDLCALC, TRIG, CHOLHDL, LDLDIRECT in the last 72 hours. Thyroid Function Tests: No results for input(s): TSH, T4TOTAL, FREET4, T3FREE, THYROIDAB in the last 72 hours. Anemia Panel: No results for input(s): VITAMINB12, FOLATE, FERRITIN, TIBC, IRON, RETICCTPCT in the last 72 hours. Sepsis Labs: Recent Labs  Lab 09/13/20 0354  LATICACIDVEN 1.6    Recent Results (from the past 240 hour(s))   Respiratory Panel by RT PCR (Flu A&B, Covid) - Nasopharyngeal Swab     Status: None   Collection Time: 09/12/20 10:11 PM   Specimen: Nasopharyngeal Swab  Result Value Ref Range Status   SARS Coronavirus 2 by RT PCR NEGATIVE NEGATIVE Final    Comment: (NOTE) SARS-CoV-2 target nucleic acids are NOT DETECTED.  The SARS-CoV-2 RNA is generally detectable in upper respiratoy specimens during the acute phase of infection. The lowest concentration of SARS-CoV-2 viral copies this assay can detect is 131 copies/mL. A negative result does not preclude SARS-Cov-2 infection and should not be used as the sole basis for treatment or other patient management decisions. A negative result may occur with  improper specimen collection/handling, submission of specimen other than nasopharyngeal swab, presence of viral mutation(s) within the areas targeted by this assay, and inadequate number of viral copies (<131 copies/mL). A negative result must be combined with clinical observations, patient history, and epidemiological information. The expected result is Negative.  Fact Sheet for Patients:  PinkCheek.be  Fact Sheet for Healthcare Providers:  GravelBags.it  This test is no t yet approved or cleared by the [place] FDA and  has been authorized for detection and/or diagnosis of SARS-CoV-2 by FDA under an Emergency Use Authorization (EUA). This EUA will remain  in effect (meaning this test can be used) for the duration of the COVID-19 declaration under Section 564(b)(1) of the Act, 21 U.S.C. section 360bbb-3(b)(1), unless the authorization is terminated or revoked sooner.     Influenza A by PCR NEGATIVE NEGATIVE Final   Influenza B by PCR NEGATIVE NEGATIVE Final    Comment: (NOTE) The Xpert Xpress SARS-CoV-2/FLU/RSV assay is intended as an aid in  the diagnosis of influenza from Nasopharyngeal swab specimens and  should not be used as  a sole basis for treatment. Nasal washings and  aspirates are unacceptable for Xpert Xpress SARS-CoV-2/FLU/RSV  testing.  Fact Sheet for Patients: PinkCheek.be  Fact Sheet for Healthcare Providers: GravelBags.it  This test is not yet approved or cleared by the [place] FDA and  has been authorized for detection and/or diagnosis of SARS-CoV-2 by  FDA under an Emergency Use Authorization (  EUA). This EUA will remain  in effect (meaning this test can be used) for the duration of the  Covid-19 declaration under Section 564(b)(1) of the Act, 21  U.S.C. section 360bbb-3(b)(1), unless the authorization is  terminated or revoked. Performed at [facility], [place] [address], [place], [place] [address]   MRSA PCR Screening     Status: None   Collection Time: 09/15/20  9:27 AM   Specimen: Nasopharyngeal  Result Value Ref Range Status   MRSA by PCR NEGATIVE NEGATIVE Final    Comment:        The GeneXpert MRSA Assay (FDA approved for NASAL specimens only), is one component of a comprehensive MRSA colonization surveillance program. It is not intended to diagnose MRSA infection nor to guide or monitor treatment for MRSA infections. Performed at [facility], [place] [address], [place], [place] [address]      Radiology Studies: No results found.   LOS: 5 days   Antonieta Pert, MD [facility]  09/18/2020, 12:37 PM

## 2020-09-18 NOTE — Anesthesia Procedure Notes (Signed)
Arterial Line Insertion Start/End11/12/2019 12:30 PM, 09/18/2020 12:40 PM Performed by: Amadeo Garnet, CRNA, CRNA  Patient location: Pre-op. Preanesthetic checklist: patient identified, IV checked, site marked, risks and benefits discussed, surgical consent, monitors and equipment checked, pre-op evaluation, timeout performed and anesthesia consent Lidocaine 1% used for infiltration and patient sedated Left, radial was placed Catheter size: 20 G Hand hygiene performed  and maximum sterile barriers used   Attempts: 1 Procedure performed without using ultrasound guided technique. Following insertion, Biopatch and dressing applied. Post procedure assessment: normal  Patient tolerated the procedure well with no immediate complications.

## 2020-09-18 NOTE — Care Management Important Message (Signed)
Important Message  Patient Details  Name: Mathew Leach MRN: [id number] Date of Birth: [birth year]   Medicare Important Message Given:  Yes   Patient wasn't  Not in room im mailed to the patient home address.   Mathew Leach 09/18/2020, 2:01 PM

## 2020-09-18 NOTE — Progress Notes (Signed)
  NEUROSURGERY PROGRESS NOTE   No issues overnight. Pt without complaints this am, ready to proceed with surgery.  EXAM:  BP 111/72 (BP Location: Right Arm)   Pulse (!) 54   Temp 97.7 F (36.5 C) (Oral)   Resp 18   Ht 5\' 8"  (1.727 m)   Wt 81 kg   SpO2 95%   BMI 27.15 kg/m   Awake, alert, oriented  Speech fluent, appropriate  CN grossly intact  5/5 BUE/BLE   IMPRESSION:  69 y.o. male with large cystic left frontal tumor requiring resection for relief of mass effect and tissue diagnosis.  PLAN: - OR today for left craniotomy for resection of tumor  I spoke with the patient, his wife, and son at bedside this am. All questions today were answered and they are ready to proceed with surgery.  Consuella Lose, MD [facility]

## 2020-09-18 NOTE — Anesthesia Preprocedure Evaluation (Signed)
Anesthesia Evaluation  Patient identified by MRN, date of birth, ID band Patient awake  General Assessment Comment:69 year old male with history of stroke, PFO, PAF, HTN, OSA presented with altered mental status and seizure.  He was seen by his coworker last around 4 PM and appeared fatigued, later was found lying down on the ground and EMS was called, had witnessed seizure during transport.  He was seen in the ED and was admitted 09/12/20, underwent further extensive work-up, seen by neurology. Found to have large cystic left frontal lesion-seen by neurosurgery planning for resection 11/2   Reviewed: Allergy & Precautions, NPO status , Patient's Chart, lab work & pertinent test results  Airway Mallampati: II  TM Distance: >3 FB Neck ROM: Full    Dental no notable dental hx.    Pulmonary sleep apnea ,    Pulmonary exam normal breath sounds clear to auscultation       Cardiovascular hypertension, Normal cardiovascular exam+ dysrhythmias Atrial Fibrillation  Rhythm:Regular Rate:Normal     Neuro/Psych CVA negative psych ROS   GI/Hepatic negative GI ROS, Neg liver ROS,   Endo/Other  negative endocrine ROS  Renal/GU negative Renal ROS  negative genitourinary   Musculoskeletal negative musculoskeletal ROS (+)   Abdominal   Peds negative pediatric ROS (+)  Hematology negative hematology ROS (+)   Anesthesia Other Findings   Reproductive/Obstetrics negative OB ROS                             Anesthesia Physical Anesthesia Plan  ASA: III  Anesthesia Plan: General   Post-op Pain Management:    Induction: Intravenous  PONV Risk Score and Plan: 2 and Ondansetron, Dexamethasone and Treatment may vary due to age or medical condition  Airway Management Planned: Oral ETT  Additional Equipment: Arterial line  Intra-op Plan:   Post-operative Plan: Extubation in OR  Informed Consent: I have  reviewed the patients History and Physical, chart, labs and discussed the procedure including the risks, benefits and alternatives for the proposed anesthesia with the patient or authorized representative who has indicated his/her understanding and acceptance.     Dental advisory given  Plan Discussed with: CRNA and Surgeon  Anesthesia Plan Comments:         Anesthesia Quick Evaluation

## 2020-09-19 ENCOUNTER — Encounter (HOSPITAL_COMMUNITY): Payer: Self-pay | Admitting: Neurosurgery

## 2020-09-19 ENCOUNTER — Inpatient Hospital Stay (HOSPITAL_COMMUNITY): Payer: Medicare Other

## 2020-09-19 DIAGNOSIS — I480 Paroxysmal atrial fibrillation: Secondary | ICD-10-CM | POA: Diagnosis not present

## 2020-09-19 LAB — BASIC METABOLIC PANEL
Anion gap: 11 (ref 5–15)
BUN: 22 mg/dL (ref 8–23)
CO2: 22 mmol/L (ref 22–32)
Calcium: 8 mg/dL — ABNORMAL LOW (ref 8.9–10.3)
Chloride: 106 mmol/L (ref 98–111)
Creatinine, Ser: 1.01 mg/dL (ref 0.61–1.24)
GFR, Estimated: >60 mL/min (ref >60)
Glucose, Bld: 152 mg/dL — ABNORMAL HIGH (ref 70–99)
Potassium: 3.6 mmol/L (ref 3.5–5.1)
Sodium: 139 mmol/L (ref 135–145)

## 2020-09-19 LAB — CBC
HCT: 40.8 % (ref 39.0–52.0)
Hemoglobin: 14 g/dL (ref 13.0–17.0)
MCH: 30.4 pg (ref 26.0–34.0)
MCHC: 34.3 g/dL (ref 30.0–36.0)
MCV: 88.5 fL (ref 80.0–100.0)
Platelets: 236 10*3/uL (ref 150–400)
RBC: 4.61 MIL/uL (ref 4.22–5.81)
RDW: 12.4 % (ref 11.5–15.5)
WBC: 20.5 10*3/uL — ABNORMAL HIGH (ref 4.0–10.5)
nRBC: 0 % (ref 0.0–0.2)

## 2020-09-19 LAB — GLUCOSE, CAPILLARY
Glucose-Capillary: 182 mg/dL — ABNORMAL HIGH (ref 70–99)
Glucose-Capillary: 124 mg/dL — ABNORMAL HIGH (ref 70–99)
Glucose-Capillary: 127 mg/dL — ABNORMAL HIGH (ref 70–99)

## 2020-09-19 LAB — LACTATE DEHYDROGENASE: LDH: 173 U/L (ref 98–192)

## 2020-09-19 MED ORDER — PANTOPRAZOLE SODIUM 40 MG PO TBEC
40.0000 mg | DELAYED_RELEASE_TABLET | Freq: Every day | ORAL | Status: DC
  Administered 2020-09-19 – 2020-09-22 (×4): 40 mg via ORAL
  Filled 2020-09-19 (×5): qty 1

## 2020-09-19 NOTE — Op Note (Addendum)
NEUROSURGERY OPERATIVE NOTE   PREOP DIAGNOSIS:  1. Left frontal tumor   POSTOP DIAGNOSIS: Same  PROCEDURE: 1. Stereotactic left frontal craniotomy for resection of tumor  SURGEON: Dr. Consuella Lose, MD  ASSISTANT: Ferne Reus, PA-C  ANESTHESIA: General Endotracheal  EBL: 200cc  SPECIMENS: Left frontal tumor for permanent pathology  DRAINS: None  COMPLICATIONS: None immediate  CONDITION: Hemodynamically stable to postanesthesia care unit  HISTORY: Jadarrius Maselli is a 69 y.o. male initially presented to the hospital after suffering a witnessed seizure.  Seizure was initially controlled with Keppra.  CT scan followed by MRI scan was done demonstrating a relatively large approximately 4 to 5 cm left frontal tumor with peripheral enhancement.  Further work-up included CT scan of the chest abdomen pelvis demonstrating a right-sided renal mass, although careful review did not demonstrate any associated lymphadenopathy making metastatic disease less likely.  Nonetheless, given the size of the left frontal tumor and associated mass-effect as well as the symptomatic nature, surgical resection was indicated both for relief of mass-effect as well as tissue diagnosis.  The risks, benefits, and alternatives to surgery were reviewed in detail with both the patient, his wife, and his son.  After all questions were answered informed consent was obtained and witnessed.  PROCEDURE IN DETAIL: The patient was brought to the operating room. After induction of general anesthesia, the patient was positioned on the operative table in the Mayfield head holder in the supine position. All pressure points were meticulously padded.  Preoperative MRI scan was then used to call register surface markers with good accuracy.  Stereotactic system was then used to identify the underlying tumor and plan out a reverse question mark incision to allow access to the entire tumor.  Skin incision was then marked out and  prepped and draped in the usual sterile fashion.  After timeout was conducted, the skin incision was infiltrated with local anesthetic with epinephrine.  Incision was then made sharply and carried down through the galea.  Raney clips were applied for hemostasis on the skin.  The periosteum, temporalis muscle and fascia were then incised with the Bovie.  A single piece myocutaneous flap was then elevated and reflected anteriorly.  Again using the stereotactic system, a left frontal craniotomy was planned out to allow access to the entire lesion.  Bur holes were then created and connected with the craniotome, and a single piece frontal flap was elevated.  The dura was noted to be densely adherent to the undersurface of the bone and the external layer of the dura was removed with the bone flap.  Hemostasis on the epidural plane was secured with bipolar electrocautery and morselized Gelfoam and thrombin.  Dura was then opened in cruciate fashion and the dural leaflets reflected.  Stereotactic system was again used to identify the most superficial portion of the underlying cystic tumor.  The pia overlying this area was then coagulated with the bipolar and cut sharply.  Bipolar electrocautery and the ultrasonic aspirator on relatively low settings was then used to dissect through the underlying white matter.  Slightly more pale and firm tumor capsule was then identified.  Utilizing the differential turgor of the tumor versus normal white matter, I was able to use the ultrasonic aspirator to develop this plane around the circumference of the tumor.  Tumor was noted to extend medially to nearly the falx.  Once the periphery of the tumor was dissected, I then began to dissect the deep plane around the tumor.  During this dissection  the ependyma of the left frontal horn was identified, and a small opening into the left frontal horn was created during dissection.  Also during dissection of the tumor, the cyst wall was  opened and a small area superficially and thin yellow fluid was released from the cyst.  The tumor was noted to be relatively adherent to the ventricular ependyma, and the bipolar electrocautery was used to release the deep margin of the tumor from the attachment at the ependyma.  This allowed removal of the tumor en bloc.  It was sent for permanent pathology.  At this point the tumor bed was irrigated with normal saline irrigation.  Hemostasis was easily secured with morselized Gelfoam and thrombin.  The tumor bed was again irrigated with normal saline without any active bleeding identified.  The tumor bed was inspected and no visibly identifiable tumor was seen.  The resection cavity was then covered with large pieces of Surgicel including the opening in the ependyma.  The dura was then reapproximated with interrupted 4-0 Nurolon stitches.  The subdural space was filled with normal saline fluid and the dural suture line was covered with DuraGen onlay graft.  A layer of polyethylene glycol dural sealant was then placed.  Bone flap was then placed and plated with standard titanium plates and screws.  The temporalis muscle and fascia were reapproximated with interrupted 0 Vicryl stitches.  The galea was reapproximated with interrupted 0 and 3-0 Vicryl stitches and the skin was closed with staples.  Mayfield head holder was removed.  Bacitracin ointment and sterile dressing was applied.  Sterile head wrap was then placed.  Patient was then transferred to the stretcher, extubated, and taken to the postanesthesia care unit in stable hemodynamic condition.  At the end of the case all sponge, needle, instrument, and cottonoid counts were correct.

## 2020-09-19 NOTE — Progress Notes (Signed)
Pt transferred from 4N ICU. Alert and oriented to person, place, time and situation. Pt has bandage in place around head that is dry and intact. Pt is on telemetry monitor with SR noted. Pt has call button within reach and bed is in lowest position with bed alarm set. Staff will continue monitor.

## 2020-09-19 NOTE — Progress Notes (Signed)
  NEUROSURGERY PROGRESS NOTE   No issues overnight. Complains of appropriate HA No new N/T/W  EXAM:  BP (!) 166/84   Pulse 71   Temp 97.8 F (36.6 C) (Axillary)   Resp 15   Ht 5' 7.99" (1.727 m)   Wt 81 kg   SpO2 99%   BMI 27.16 kg/m   Awake, alert, oriented  Speech fluent, appropriate  CN grossly intact  5/5 LUE/BLE, stable mild weakness RUE  IMPRESSION/PLAN 69 y.o. male POD1 craniotomy for resection of large cystic left frontal tumor. Stable neurologically. MRI shows total resection. - remove a line, foley - PT/OT today - continue supportive care, keppra, decadron

## 2020-09-19 NOTE — Progress Notes (Signed)
PROGRESS NOTE    Mathew Leach  QQP:[id number] DOB: [birth year] DOA: 09/12/2020 PCP: Maryella Shivers, MD   Chief Complaint  Patient presents with  . Loss of Consciousness  . Seizures    Brief Narrative: 69 year old male with history of stroke, PFO, PAF, HTN, OSA presented with altered mental status and seizure.  He was seen by his coworker last around 4 PM and appeared fatigued, later was found lying down on the ground and EMS was called, had witnessed seizure during transport.  He was seen in the ED and was admitted 09/12/20, underwent further extensive work-up, seen by neurology. Found to have large cystic left frontal lesion-seen by neurosurgery planning for resection 11/2 11/2: Underwent resection of left frontal lesion, transferred to neuro ICU postop  Subjective: Seen this morning, some headache expected, no numbness tingling or focal weakness.    Assessment & Plan:  New onset seizure in the setting of large cystic left frontal lesion: Status post resection of cyst by neurosurgery, MRI post resection shows resected status , continue on Keppra 1000 mg BID and IV Decadron as per neurology/neurosurgery, continue seizure precaution fall precaution PT OT pain control.  Be transferred to progressive unit today.  Awaiting on biopsy of the mass.  Possible malignant renal mass seen in the MRI abdomen-reported as exophytic cyst on the lateral midportion of the right kidney with a very thickened nodular wall thickening internal septation limited postcontrast sequences but appears to be some contrast enhancement with mobile components, concerning for renal cell carcinoma, however PER REPORT this lesion is not likely source of brain mass identified by MRI given the absence of lymphadenopathy or other findings of metastatic disease in the abdomen. Await work-up as #1 and I have discussed with oncology NP and they are aware  PAF: Currently in sinus rhythm seen by cardiology outpatient per note no  clinical recurrence, watchman device in place on antiplatelet therapy with aspirin at home.  Aspirin held for surgery monitoring telemetry.   Essential hypertension BP controlled on amlodipine.ACE inhibitor on hold due to AKI.  Prediabetes well-controlled A1c 5.8.  Continue sliding scale insulin  Anion gap metabolic acidosis in the setting of seizure.  Resolved. AKI -it has resolved.  Leukocytosis:likely postop.  Monitor CBC, monitor temperature curve.  Patient received perioperative antibiotics.  Nutrition: Diet Order            Diet heart healthy/carb modified Room service appropriate? Yes with Assist; Fluid consistency: Thin  Diet effective now                  Body mass index is 27.16 kg/m.  DVT prophylaxis: SCDs Start: 09/18/20 1832 Place and maintain sequential compression device Start: 09/12/20 2357 Code Status:   Code Status: Full Code  Family Communication: plan of care discussed with patient at bedside.  Status is: Inpatient  Remains inpatient appropriate because:Ongoing diagnostic testing needed not appropriate for outpatient work up, IV treatments appropriate due to intensity of illness or inability to take PO and Inpatient level of care appropriate due to severity of illness   Dispo: The patient is from: Home              Anticipated d/c is to: Home              Anticipated d/c date is: >3 days              Patient currently is not medically stable to d/c.  Consultants:see note  Procedures:see note  Culture/Microbiology  Component Value Date/Time   SDES URINE, CLEAN CATCH 03/06/2016 1140   SPECREQUEST NONE 03/06/2016 1140   CULT NO GROWTH 1 DAY 03/06/2016 1140   REPTSTATUS 03/07/2016 FINAL 03/06/2016 1140    Other culture-see note  Medications: Scheduled Meds: . amLODipine  10 mg Oral Daily  . Chlorhexidine Gluconate Cloth  6 each Topical Daily  . dexamethasone (DECADRON) injection  4 mg Intravenous Q6H  . insulin aspart  0-15 Units Subcutaneous  TID WC  . insulin aspart  0-5 Units Subcutaneous QHS  . levETIRAcetam  1,000 mg Oral BID  . pantoprazole (PROTONIX) IV  40 mg Intravenous QHS  . simvastatin  20 mg Oral q1800   Continuous Infusions: . sodium chloride 100 mL/hr (09/19/20 0700)    Antimicrobials: Anti-infectives (From admission, onward)   Start     Dose/Rate Route Frequency Ordered Stop   09/18/20 2100  ceFAZolin (ANCEF) IVPB 2g/100 mL premix        2 g 200 mL/hr over 30 Minutes Intravenous Every 8 hours 09/18/20 1831 09/19/20 0449   09/18/20 1145  ceFAZolin (ANCEF) IVPB 2g/100 mL premix  Status:  Discontinued        2 g 200 mL/hr over 30 Minutes Intravenous 30 min pre-op 09/17/20 1914 09/17/20 1919   09/18/20 1145  ceFAZolin (ANCEF) IVPB 2g/100 mL premix        2 g 200 mL/hr over 30 Minutes Intravenous On call to O.R. 09/17/20 1914 09/18/20 1326     Objective: Vitals: Today's Vitals   09/19/20 0700 09/19/20 0800 09/19/20 0900 09/19/20 1000  BP: (!) 166/84 (!) 147/73 (!) 153/81 (!) 158/87  Pulse: 71 73 64 (!) 59  Resp: 15 15 14 14   Temp:  97.7 F (36.5 C)    TempSrc:  Oral    SpO2: 99% 99% 95% 96%  Weight:      Height:      PainSc:        Intake/Output Summary (Last 24 hours) at 09/19/2020 1152 Last data filed at 09/19/2020 0700 Gross per 24 hour  Intake 4873.01 ml  Output 2550 ml  Net 2323.01 ml   Filed Weights   09/13/20 1425 09/18/20 1227  Weight: 81 kg 81 kg   Weight change:   Intake/Output from previous day: 11/02 0701 - 11/03 0700 In: 4873 [I.V.:4463; IV Piggyback:410] Out: 9518 [Urine:2350; Blood:200] Intake/Output this shift: No intake/output data recorded.  Examination: General exam: AAOx3 , NAD, weak appearing. HEENT:Oral mucosa moist, Ear/Nose WNL grossly, dentition normal.  Head is wrapped up in dressing Respiratory system: bilaterally clear,no wheezing or crackles,no use of accessory muscle Cardiovascular system: S1 & S2 +, No JVD,. Gastrointestinal system: Abdomen soft, NT,ND,  BS+ Nervous System:Alert, awake, moving extremities and grossly nonfocal Extremities: No edema, distal peripheral pulses palpable.  Skin: No rashes,no icterus. MSK: Normal muscle bulk,tone, power  Data Reviewed: I have personally reviewed following labs and imaging studies CBC: Recent Labs  Lab 09/12/20 1822 09/14/20 0229 09/16/20 0134 09/19/20 0457  WBC 9.0 19.7* 13.8* 20.5*  NEUTROABS 4.5  --   --   --   HGB 14.8 13.6 12.9* 14.0  HCT 46.6 40.4 39.1 40.8  MCV 94.7 91.0 89.7 88.5  PLT 323 279 260 841   Basic Metabolic Panel: Recent Labs  Lab 09/12/20 1801 09/13/20 0354 09/14/20 0229 09/16/20 0134 09/19/20 6606  NA 301 DUPLICATE  601 093 235 573  K 4.1 DUPLICATE  4.2 4.1 4.0 3.6  CL 220 DUPLICATE  254 270* 623 106  CO2 12* DUPLICATE  22 21* 21* 22  GLUCOSE 109* DUPLICATE  323* 557* 322* 025*  BUN 16 DUPLICATE  15 18 24* 22  CREATININE 4.27* DUPLICATE  0.62 3.76 2.83 1.01  CALCIUM 9.5 DUPLICATE  9.0 8.8* 8.5* 8.0*   GFR: Estimated Creatinine Clearance: 64.9 mL/min (by C-G formula based on SCr of 1.01 mg/dL). Liver Function Tests: Recent Labs  Lab 09/12/20 1801  AST 32  ALT 20  ALKPHOS 70  BILITOT 0.9  PROT 6.8  ALBUMIN 4.0   No results for input(s): LIPASE, AMYLASE in the last 168 hours. No results for input(s): AMMONIA in the last 168 hours. Coagulation Profile: Recent Labs  Lab 09/12/20 1801  INR 1.1   Cardiac Enzymes: No results for input(s): CKTOTAL, CKMB, CKMBINDEX, TROPONINI in the last 168 hours. BNP (last 3 results) No results for input(s): PROBNP in the last 8760 hours. HbA1C: No results for input(s): HGBA1C in the last 72 hours. CBG: Recent Labs  Lab 09/17/20 2057 09/18/20 0621 09/18/20 1205 09/18/20 2144 09/19/20 0941  GLUCAP 139* 111* 122* 201* 182*   Lipid Profile: No results for input(s): CHOL, HDL, LDLCALC, TRIG, CHOLHDL, LDLDIRECT in the last 72 hours. Thyroid Function Tests: No results for input(s): TSH, T4TOTAL,  FREET4, T3FREE, THYROIDAB in the last 72 hours. Anemia Panel: No results for input(s): VITAMINB12, FOLATE, FERRITIN, TIBC, IRON, RETICCTPCT in the last 72 hours. Sepsis Labs: Recent Labs  Lab 09/13/20 0354  LATICACIDVEN 1.6    Recent Results (from the past 240 hour(s))  Respiratory Panel by RT PCR (Flu A&B, Covid) - Nasopharyngeal Swab     Status: None   Collection Time: 09/12/20 10:11 PM   Specimen: Nasopharyngeal Swab  Result Value Ref Range Status   SARS Coronavirus 2 by RT PCR NEGATIVE NEGATIVE Final    Comment: (NOTE) SARS-CoV-2 target nucleic acids are NOT DETECTED.  The SARS-CoV-2 RNA is generally detectable in upper respiratoy specimens during the acute phase of infection. The lowest concentration of SARS-CoV-2 viral copies this assay can detect is 131 copies/mL. A negative result does not preclude SARS-Cov-2 infection and should not be used as the sole basis for treatment or other patient management decisions. A negative result may occur with  improper specimen collection/handling, submission of specimen other than nasopharyngeal swab, presence of viral mutation(s) within the areas targeted by this assay, and inadequate number of viral copies (<131 copies/mL). A negative result must be combined with clinical observations, patient history, and epidemiological information. The expected result is Negative.  Fact Sheet for Patients:  PinkCheek.be  Fact Sheet for Healthcare Providers:  GravelBags.it  This test is no t yet approved or cleared by the [place] FDA and  has been authorized for detection and/or diagnosis of SARS-CoV-2 by FDA under an Emergency Use Authorization (EUA). This EUA will remain  in effect (meaning this test can be used) for the duration of the COVID-19 declaration under Section 564(b)(1) of the Act, 21 U.S.C. section 360bbb-3(b)(1), unless the authorization is terminated or revoked  sooner.     Influenza A by PCR NEGATIVE NEGATIVE Final   Influenza B by PCR NEGATIVE NEGATIVE Final    Comment: (NOTE) The Xpert Xpress SARS-CoV-2/FLU/RSV assay is intended as an aid in  the diagnosis of influenza from Nasopharyngeal swab specimens and  should not be used as a sole basis for treatment. Nasal washings and  aspirates are unacceptable for Xpert Xpress SARS-CoV-2/FLU/RSV  testing.  Fact Sheet for Patients: PinkCheek.be  Fact Sheet for Healthcare Providers:  GravelBags.it  This test is not yet approved or cleared by the [place] and  has been authorized for detection and/or diagnosis of SARS-CoV-2 by  FDA under an Emergency Use Authorization (EUA). This EUA will remain  in effect (meaning this test can be used) for the duration of the  Covid-19 declaration under Section 564(b)(1) of the Act, 21  U.S.C. section 360bbb-3(b)(1), unless the authorization is  terminated or revoked. Performed at [facility], [place] [address] [place], [place] [address]   MRSA PCR Screening     Status: None   Collection Time: 09/15/20  9:27 AM   Specimen: Nasopharyngeal  Result Value Ref Range Status   MRSA by PCR NEGATIVE NEGATIVE Final    Comment:        The GeneXpert MRSA Assay (FDA approved for NASAL specimens only), is one component of a comprehensive MRSA colonization surveillance program. It is not intended to diagnose MRSA infection nor to guide or monitor treatment for MRSA infections. Performed at [facility], [place] [address] [place],  [address]      Radiology Studies: MR BRAIN W WO CONTRAST  Result Date: 09/19/2020 CLINICAL DATA:  Follow-up examination for CNS neoplasm. EXAM: MRI HEAD WITHOUT AND WITH CONTRAST TECHNIQUE: Multiplanar, multiecho pulse sequences of the brain and surrounding structures were obtained without and with intravenous contrast. CONTRAST:  <See Chart> GADAVIST  GADOBUTROL 1 MMOL/ML IV SOLN COMPARISON:  Prior MRI from 09/13/2020 FINDINGS: Brain: Postoperative changes from interval left frontal craniotomy for resection of previously identified left frontal lobe mass are seen. Extra-axial collection overlying the left frontal convexity subjacent to the craniotomy bone flap is seen, measuring up to approximately 13 mm in thickness. Superimposed postoperative pneumocephalus noted. The previously seen left frontal mass has been resected. Irregular T1 shortening and susceptibility artifact about the resection cavity consistent with postoperative blood products. No definite nodular enhancement to suggest residual tumor. Smooth dural thickening and enhancement overlying the left cerebral convexity consistent with postoperative change. Surrounding T2/FLAIR signal abnormality within the adjacent left frontal lobe persists, but is improved from prior. Postoperative change extends into the frontal horn of the adjacent partially compressed left lateral ventricle. Layering material with susceptibility artifact within the ventricular system compatible with intraventricular hemorrhage. Scattered siderosis about the cerebellum also compatible with small volume subarachnoid blood, presumably postoperative. Persistent mass effect with up to 5 mm left-to-right shift. No hydrocephalus or ventricular trapping. Basilar cisterns remain patent. Small ischemic infarct seen involving the left caudate/internal capsule at the deep margin of the resection cavity (series 5, image 78). Additional 5 mm cortical infarct present at the high posterior right parietal lobe (series 5, image 91). Underlying atrophy with chronic microvascular ischemic disease again noted. Remote lacunar infarct present at the left thalamus. Few scattered small remote bilateral cerebellar infarcts. Vascular: Major intracranial vascular flow voids are maintained. Skull and upper cervical spine: Craniocervical junction within normal  limits. Bone marrow signal intensity normal. Post craniotomy changes present at the left frontal calvarium and scalp. Sinuses/Orbits: Globes and orbital soft tissues demonstrate no acute finding. Mild right sphenoid sinus disease noted. Mastoid air cells remain clear. Other: None. IMPRESSION: 1. Postoperative changes from interval left frontal craniotomy for resection of left frontal lobe mass. No appreciable residual tumor on this immediate postoperative scan. 2. Persistent but improved surrounding T2/FLAIR signal abnormality within the adjacent left frontal lobe. Postoperative extra-axial collection with pneumocephalus subjacent to the craniotomy site as above. Persistent 5 mm left-to-right midline shift. 3.  Small acute ischemic infarcts involving the adjacent left basal ganglia and high posterior right parietal lobe. 4. Scattered postoperative subarachnoid and intraventricular hemorrhage. No hydrocephalus or trapping. Electronically Signed   By: Jeannine Boga M.D.   On: 09/19/2020 04:29     LOS: 6 days   Antonieta Pert, MD [facility]  09/19/2020, 11:52 AM

## 2020-09-20 DIAGNOSIS — R569 Unspecified convulsions: Secondary | ICD-10-CM | POA: Diagnosis not present

## 2020-09-20 DIAGNOSIS — I480 Paroxysmal atrial fibrillation: Secondary | ICD-10-CM | POA: Diagnosis not present

## 2020-09-20 LAB — GLUCOSE, CAPILLARY
Glucose-Capillary: 141 mg/dL — ABNORMAL HIGH (ref 70–99)
Glucose-Capillary: 163 mg/dL — ABNORMAL HIGH (ref 70–99)
Glucose-Capillary: 124 mg/dL — ABNORMAL HIGH (ref 70–99)
Glucose-Capillary: 146 mg/dL — ABNORMAL HIGH (ref 70–99)

## 2020-09-20 LAB — CBC
HCT: 43.9 % (ref 39.0–52.0)
Hemoglobin: 15.1 g/dL (ref 13.0–17.0)
MCH: 30.8 pg (ref 26.0–34.0)
MCHC: 34.4 g/dL (ref 30.0–36.0)
MCV: 89.4 fL (ref 80.0–100.0)
Platelets: 223 10*3/uL (ref 150–400)
RBC: 4.91 MIL/uL (ref 4.22–5.81)
RDW: 12.7 % (ref 11.5–15.5)
WBC: 17.1 10*3/uL — ABNORMAL HIGH (ref 4.0–10.5)
nRBC: 0 % (ref 0.0–0.2)

## 2020-09-20 LAB — BASIC METABOLIC PANEL
Anion gap: 10 (ref 5–15)
BUN: 22 mg/dL (ref 8–23)
CO2: 25 mmol/L (ref 22–32)
Calcium: 8.4 mg/dL — ABNORMAL LOW (ref 8.9–10.3)
Chloride: 105 mmol/L (ref 98–111)
Creatinine, Ser: 1.2 mg/dL (ref 0.61–1.24)
GFR, Estimated: >60 mL/min (ref >60)
Glucose, Bld: 162 mg/dL — ABNORMAL HIGH (ref 70–99)
Potassium: 4.1 mmol/L (ref 3.5–5.1)
Sodium: 140 mmol/L (ref 135–145)

## 2020-09-20 LAB — LACTATE DEHYDROGENASE: LDH: 221 U/L — ABNORMAL HIGH (ref 98–192)

## 2020-09-20 NOTE — Progress Notes (Signed)
Mr. Gribble seems to be doing okay after his surgery.  We do not have any pathology result back as of yet.  Think the problem might be that if the pathology on the brain lesion is not renal cell carcinoma, then we will going have to figure out what to do about this mass in the right kidney.  By the radiology report, they certainly feel this is malignant.  As such, I wonder if the mass may need to be resected out.  I think would be unusual for someone to have 2 malignancies at the same time but it is not unheard of.  Again, if the brain lesion is not renal cell carcinoma, I would have urology take a look at Mr. Tillery in see how we can manage this renal mass.  Since there is no obvious metastatic disease from the renal mass, I would think that given the size, it should be removed, this might require nephrectomy.  Hopefully, we will have the pathology out today for the brain mass.  Lattie Haw, MD  Psalm 124:8

## 2020-09-20 NOTE — Progress Notes (Signed)
PROGRESS NOTE    Mathew Leach  PPJ:[id number] DOB: 1951-07-28 DOA: 09/12/2020 PCP: Maryella Shivers, MD   Chief Complaint  Patient presents with  . Loss of Consciousness  . Seizures    Brief Narrative: 69 year old male with history of stroke, PFO, PAF, HTN, OSA presented with altered mental status and seizure.  He was seen by his coworker last around 4 PM and appeared fatigued, later was found lying down on the ground and EMS was called, had witnessed seizure during transport.  He was seen in the ED and was admitted 09/12/20, underwent further extensive work-up, seen by neurology. Found to have large cystic left frontal lesion-seen by neurosurgery planning for resection 11/2 11/2: Underwent resection of left frontal lesion, transferred to neuro ICU postop  Subjective:  No acute events overnight. Afebrile. Denies headache numbness tingling focal weakness speech is clear.  No swelling seizures.  Wife is at the bedside.  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  Continue Keppra and IV Decadron, continue seizure precaution fall precaution PT OT.  Awaiting biopsy of the mass to decide further plan, oncology following.  Possible malignant renal mass seen in the MRI abdomen-reported as exophytic cyst on the lateral midportion of the right kidney with a very thickened nodular wall thickening internal septation limited postcontrast sequences but appears to be some contrast enhancement with mobile components, concerning for renal cell carcinoma, however PER REPORT this lesion is not likely source of brain mass identified by MRI given the absence of lymphadenopathy or other findings of metastatic disease in the abdomen. Await work-up as #1 and further input from oncology.   PAF: In normal sinus rhythm, watchman device in place and at home on aspirin held for  surgery.  Currently in sinus rhythm seen by cardiology outpatient per note no clinical recurrence, watchman device in place on antiplatelet therapy with aspirin at home.  Aspirin held for surgery monitoring telemetry.   Essential hypertension BP controlled on amlodipine.ACE inhibitor on hold due to AKI.  Prediabetes well-controlled A1c 5.8.  Continue sliding scale insulin  Anion gap metabolic acidosis in the setting of seizure.  Resolved. AKI -it has resolved.  Leukocytosis:likely postop/reactive, he is afebrile, leukocytosis downtrending.Monitor CBC, monitor temperature curve.  Status post perioperative antibiotics.  Nutrition: Diet Order            Diet heart healthy/carb modified Room service appropriate? Yes with Assist; Fluid consistency: Thin  Diet effective now                  Body mass index is 27.16 kg/m.  DVT prophylaxis: SCDs Start: 09/18/20 1832 Place and maintain sequential compression device Start: 09/12/20 2357 Code Status:   Code Status: Full Code  Family Communication: plan of care discussed with patient at bedside.  Status is: Inpatient  Remains inpatient appropriate because:Ongoing diagnostic testing needed not appropriate for outpatient work up, IV treatments appropriate due to intensity of illness or inability to take PO and Inpatient level of care appropriate due to severity of illness   Dispo: The patient is from: Home              Anticipated d/c is to: Home              Anticipated d/c date is: >3 days              Patient currently is  not medically stable to d/c.  Consultants:see note  Procedures:see note  Culture/Microbiology    Component Value Date/Time   SDES URINE, CLEAN CATCH 03/06/2016 1140   SPECREQUEST NONE 03/06/2016 1140   CULT NO GROWTH 1 DAY 03/06/2016 1140   REPTSTATUS 03/07/2016 FINAL 03/06/2016 1140    Other culture-see note  Medications: Scheduled Meds: . amLODipine  10 mg Oral Daily  . Chlorhexidine Gluconate Cloth  6  each Topical Daily  . dexamethasone (DECADRON) injection  4 mg Intravenous Q6H  . insulin aspart  0-15 Units Subcutaneous TID WC  . insulin aspart  0-5 Units Subcutaneous QHS  . levETIRAcetam  1,000 mg Oral BID  . pantoprazole  40 mg Oral QHS  . simvastatin  20 mg Oral q1800   Continuous Infusions: . sodium chloride Stopped (09/19/20 1329)    Antimicrobials: Anti-infectives (From admission, onward)   Start     Dose/Rate Route Frequency Ordered Stop   09/18/20 2100  ceFAZolin (ANCEF) IVPB 2g/100 mL premix        2 g 200 mL/hr over 30 Minutes Intravenous Every 8 hours 09/18/20 1831 09/19/20 0449   09/18/20 1145  ceFAZolin (ANCEF) IVPB 2g/100 mL premix  Status:  Discontinued        2 g 200 mL/hr over 30 Minutes Intravenous 30 min pre-op 09/17/20 1914 09/17/20 1919   09/18/20 1145  ceFAZolin (ANCEF) IVPB 2g/100 mL premix        2 g 200 mL/hr over 30 Minutes Intravenous On call to O.R. 09/17/20 1914 09/18/20 1326     Objective: Vitals: Today's Vitals   09/19/20 2355 09/20/20 0114 09/20/20 0202 09/20/20 0412  BP: (!) 148/84   (!) 150/69  Pulse: 72   63  Resp: 18   13  Temp: 98 F (36.7 C)   97.7 F (36.5 C)  TempSrc: Oral   Oral  SpO2: 99%     Weight:      Height:      PainSc:  3  Asleep     Intake/Output Summary (Last 24 hours) at 09/20/2020 0822 Last data filed at 09/19/2020 1400 Gross per 24 hour  Intake 636.99 ml  Output --  Net 636.99 ml   Filed Weights   09/13/20 1425 09/18/20 1227  Weight: 81 kg 81 kg   Weight change:   Intake/Output from previous day: 11/03 0701 - 11/04 0700 In: 637 [I.V.:637] Out: -  Intake/Output this shift: No intake/output data recorded.  Examination: General exam: AAOx3 , NAD, weak appearing.  Dressing present around the head. HEENT:Oral mucosa moist, Ear/Nose WNL grossly, dentition normal.  Head is wrapped up in dressing Respiratory system: bilaterally clear,no wheezing or crackles,no use of accessory muscle Cardiovascular  system: S1 & S2 +, No JVD. Gastrointestinal system: Abdomen soft, NT,ND, BS+ Nervous System:Alert, awake, moving extremities and grossly nonfocal Extremities: No edema, distal peripheral pulses palpable.  Skin: No rashes,no icterus. MSK: Normal muscle bulk,tone, power  Data Reviewed: I have personally reviewed following labs and imaging studies CBC: Recent Labs  Lab 09/14/20 0229 09/16/20 0134 09/19/20 0457 09/20/20 0143  WBC 19.7* 13.8* 20.5* 17.1*  HGB 13.6 12.9* 14.0 15.1  HCT 40.4 39.1 40.8 43.9  MCV 91.0 89.7 88.5 89.4  PLT 279 260 236 253   Basic Metabolic Panel: Recent Labs  Lab 09/14/20 0229 09/16/20 0134 09/19/20 0457 09/20/20 0143  NA 141 140 139 140  K 4.1 4.0 3.6 4.1  CL 113* 109 106 105  CO2 21* 21* 22 25  GLUCOSE  159* 131* 152* 162*  BUN 18 24* 22 22  CREATININE 1.15 1.07 1.01 1.20  CALCIUM 8.8* 8.5* 8.0* 8.4*   GFR: Estimated Creatinine Clearance: 54.6 mL/min (by C-G formula based on SCr of 1.2 mg/dL). Liver Function Tests: No results for input(s): AST, ALT, ALKPHOS, BILITOT, PROT, ALBUMIN in the last 168 hours. No results for input(s): LIPASE, AMYLASE in the last 168 hours. No results for input(s): AMMONIA in the last 168 hours. Coagulation Profile: No results for input(s): INR, PROTIME in the last 168 hours. Cardiac Enzymes: No results for input(s): CKTOTAL, CKMB, CKMBINDEX, TROPONINI in the last 168 hours. BNP (last 3 results) No results for input(s): PROBNP in the last 8760 hours. HbA1C: No results for input(s): HGBA1C in the last 72 hours. CBG: Recent Labs  Lab 09/18/20 2144 09/19/20 0941 09/19/20 1548 09/19/20 2213 09/20/20 0620  GLUCAP 201* 182* 124* 127* 141*   Lipid Profile: No results for input(s): CHOL, HDL, LDLCALC, TRIG, CHOLHDL, LDLDIRECT in the last 72 hours. Thyroid Function Tests: No results for input(s): TSH, T4TOTAL, FREET4, T3FREE, THYROIDAB in the last 72 hours. Anemia Panel: No results for input(s): VITAMINB12,  FOLATE, FERRITIN, TIBC, IRON, RETICCTPCT in the last 72 hours. Sepsis Labs: No results for input(s): PROCALCITON, LATICACIDVEN in the last 168 hours.  Recent Results (from the past 240 hour(s))  Respiratory Panel by RT PCR (Flu A&B, Covid) - Nasopharyngeal Swab     Status: None   Collection Time: 09/12/20 10:11 PM   Specimen: Nasopharyngeal Swab  Result Value Ref Range Status   SARS Coronavirus 2 by RT PCR NEGATIVE NEGATIVE Final    Comment: (NOTE) SARS-CoV-2 target nucleic acids are NOT DETECTED.  The SARS-CoV-2 RNA is generally detectable in upper respiratoy specimens during the acute phase of infection. The lowest concentration of SARS-CoV-2 viral copies this assay can detect is 131 copies/mL. A negative result does not preclude SARS-Cov-2 infection and should not be used as the sole basis for treatment or other patient management decisions. A negative result may occur with  improper specimen collection/handling, submission of specimen other than nasopharyngeal swab, presence of viral mutation(s) within the areas targeted by this assay, and inadequate number of viral copies (<131 copies/mL). A negative result must be combined with clinical observations, patient history, and epidemiological information. The expected result is Negative.  Fact Sheet for Patients:  PinkCheek.be  Fact Sheet for Healthcare Providers:  GravelBags.it  This test is no t yet approved or cleared by the [place] FDA and  has been authorized for detection and/or diagnosis of SARS-CoV-2 by FDA under an Emergency Use Authorization (EUA). This EUA will remain  in effect (meaning this test can be used) for the duration of the COVID-19 declaration under Section 564(b)(1) of the Act, 21 U.S.C. section 360bbb-3(b)(1), unless the authorization is terminated or revoked sooner.     Influenza A by PCR NEGATIVE NEGATIVE Final   Influenza B by PCR  NEGATIVE NEGATIVE Final    Comment: (NOTE) The Xpert Xpress SARS-CoV-2/FLU/RSV assay is intended as an aid in  the diagnosis of influenza from Nasopharyngeal swab specimens and  should not be used as a sole basis for treatment. Nasal washings and  aspirates are unacceptable for Xpert Xpress SARS-CoV-2/FLU/RSV  testing.  Fact Sheet for Patients: PinkCheek.be  Fact Sheet for Healthcare Providers: GravelBags.it  This test is not yet approved or cleared by the [place] FDA and  has been authorized for detection and/or diagnosis of SARS-CoV-2 by  FDA under an Emergency Use  Authorization (EUA). This EUA will remain  in effect (meaning this test can be used) for the duration of the  Covid-19 declaration under Section 564(b)(1) of the Act, 21  U.S.C. section 360bbb-3(b)(1), unless the authorization is  terminated or revoked. Performed at [facility], [place] [address] [place], [place] [address]   MRSA PCR Screening     Status: None   Collection Time: 09/15/20  9:27 AM   Specimen: Nasopharyngeal  Result Value Ref Range Status   MRSA by PCR NEGATIVE NEGATIVE Final    Comment:        The GeneXpert MRSA Assay (FDA approved for NASAL specimens only), is one component of a comprehensive MRSA colonization surveillance program. It is not intended to diagnose MRSA infection nor to guide or monitor treatment for MRSA infections. Performed at [facility], [place] [address] [place], [place] [address]      Radiology Studies: MR BRAIN W WO CONTRAST  Result Date: 09/19/2020 CLINICAL DATA:  Follow-up examination for CNS neoplasm. EXAM: MRI HEAD WITHOUT AND WITH CONTRAST TECHNIQUE: Multiplanar, multiecho pulse sequences of the brain and surrounding structures were obtained without and with intravenous contrast. CONTRAST:  <See Chart> GADAVIST GADOBUTROL 1 MMOL/ML IV SOLN COMPARISON:  Prior MRI from 09/13/2020 FINDINGS:  Brain: Postoperative changes from interval left frontal craniotomy for resection of previously identified left frontal lobe mass are seen. Extra-axial collection overlying the left frontal convexity subjacent to the craniotomy bone flap is seen, measuring up to approximately 13 mm in thickness. Superimposed postoperative pneumocephalus noted. The previously seen left frontal mass has been resected. Irregular T1 shortening and susceptibility artifact about the resection cavity consistent with postoperative blood products. No definite nodular enhancement to suggest residual tumor. Smooth dural thickening and enhancement overlying the left cerebral convexity consistent with postoperative change. Surrounding T2/FLAIR signal abnormality within the adjacent left frontal lobe persists, but is improved from prior. Postoperative change extends into the frontal horn of the adjacent partially compressed left lateral ventricle. Layering material with susceptibility artifact within the ventricular system compatible with intraventricular hemorrhage. Scattered siderosis about the cerebellum also compatible with small volume subarachnoid blood, presumably postoperative. Persistent mass effect with up to 5 mm left-to-right shift. No hydrocephalus or ventricular trapping. Basilar cisterns remain patent. Small ischemic infarct seen involving the left caudate/internal capsule at the deep margin of the resection cavity (series 5, image 78). Additional 5 mm cortical infarct present at the high posterior right parietal lobe (series 5, image 91). Underlying atrophy with chronic microvascular ischemic disease again noted. Remote lacunar infarct present at the left thalamus. Few scattered small remote bilateral cerebellar infarcts. Vascular: Major intracranial vascular flow voids are maintained. Skull and upper cervical spine: Craniocervical junction within normal limits. Bone marrow signal intensity normal. Post craniotomy changes present  at the left frontal calvarium and scalp. Sinuses/Orbits: Globes and orbital soft tissues demonstrate no acute finding. Mild right sphenoid sinus disease noted. Mastoid air cells remain clear. Other: None. IMPRESSION: 1. Postoperative changes from interval left frontal craniotomy for resection of left frontal lobe mass. No appreciable residual tumor on this immediate postoperative scan. 2. Persistent but improved surrounding T2/FLAIR signal abnormality within the adjacent left frontal lobe. Postoperative extra-axial collection with pneumocephalus subjacent to the craniotomy site as above. Persistent 5 mm left-to-right midline shift. 3. Small acute ischemic infarcts involving the adjacent left basal ganglia and high posterior right parietal lobe. 4. Scattered postoperative subarachnoid and intraventricular hemorrhage. No hydrocephalus or trapping. Electronically Signed   By: Marland Kitchen  Jeannine Boga M.D.   On: 09/19/2020 04:29     LOS: 7 days   Antonieta Pert, MD [facility]  09/20/2020, 8:22 AM

## 2020-09-20 NOTE — Progress Notes (Signed)
  NEUROSURGERY PROGRESS NOTE   No issues overnight.  No HA, N/T/W No concerns this am  EXAM:  BP (!) 169/74 (BP Location: Right Arm)   Pulse (!) 54   Temp 97.7 F (36.5 C) (Oral)   Resp 16   Ht 5' 7.99" (1.727 m)   Wt 81 kg   SpO2 97%   BMI 27.16 kg/m   Awake, alert, oriented  Speech fluent, appropriate  CN grossly intact  Mild RUE weakness. Otherwise grossly intact Head wrap in place  IMPRESSION/PLAN 69 y.o. male POD#2 s/p resection of large cystic left frontal tumor. Appears to be at neurologic baseline, postop MRI suggests gross total resection. - PT/OT - continue keppra, decadron taper

## 2020-09-20 NOTE — Evaluation (Signed)
Physical Therapy Evaluation Patient Details Name: Mathew Leach MRN: [id number] DOB: 02-27-1951 Today's Date: 09/20/2020   History of Present Illness  69 year old male with history of stroke, PFO, PAF, HTN, OSA presented with altered mental status and seizure.  He was seen by his coworker last around 4 PM and appeared fatigued, later was found lying down on the ground and EMS was called, had witnessed seizure during transport. Pt found to have large L frontal cystic lesion. Pt underwent L frontal crani and tumor resection on 09/18/2020.  Clinical Impression  Pt presents to PT with deficits in cognition, gait, balance, functional mobility, and coordination. Pt with slowed processing, taking increased time to respond to most PT questions, and with impaired awareness of deficits. Pt also demonstrates impaired ability to follow multi-step commands. Pt demonstrates impaired coordination of RUE with slowed gross motor tasks. Pt with one loss of balance requiring PT assistance to prevent fall, ambulating with a short shuffling gait pattern at this time. Pt will benefit from continued acute PT POC to improve gait quality and reduce falls risk. PT recommends discharge home with HHPT, use of RW, and 24/7 assistance/supervision from spouse.     Follow Up Recommendations Home health PT;Supervision/Assistance - 24 hour    Equipment Recommendations  3in1 (PT)    Recommendations for Other Services       Precautions / Restrictions Precautions Precautions: Fall Restrictions Weight Bearing Restrictions: No      Mobility  Bed Mobility Overal bed mobility: Needs Assistance Bed Mobility: Supine to Sit;Sit to Supine     Supine to sit: Supervision Sit to supine: Supervision        Transfers Overall transfer level: Needs assistance Equipment used: None Transfers: Sit to/from Stand Sit to Stand: Min guard            Ambulation/Gait Ambulation/Gait assistance: Min assist Gait Distance (Feet):  100 Feet Assistive device: None Gait Pattern/deviations: Step-to pattern Gait velocity: reduced Gait velocity interpretation: <1.31 ft/sec, indicative of household ambulator General Gait Details: pt with short shuffling steps, intermittent anterior lean, one anterior loss of balance due to reduced foot clearance  Stairs            Wheelchair Mobility    Modified Rankin (Stroke Patients Only)       Balance Overall balance assessment: Needs assistance Sitting-balance support: No upper extremity supported;Feet supported Sitting balance-Leahy Scale: Good Sitting balance - Comments: supervision   Standing balance support: No upper extremity supported;During functional activity Standing balance-Leahy Scale: Poor Standing balance comment: minG-minA without UE support                             Pertinent Vitals/Pain Pain Assessment: Faces Faces Pain Scale: Hurts little more Pain Location: head Pain Descriptors / Indicators: Grimacing Pain Intervention(s): Monitored during session    Home Living Family/patient expects to be discharged to:: Private residence Living Arrangements: Spouse/significant other Available Help at Discharge: Family;Available 24 hours/day Type of Home: House Home Access: Stairs to enter Entrance Stairs-Rails: Right Entrance Stairs-Number of Steps: 4 Home Layout: One level Home Equipment: Walker - 2 wheels;Cane - single point      Prior Function Level of Independence: Independent         Comments: 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    Extremity/Trunk Assessment   Upper Extremity Assessment Upper Extremity Assessment: RUE deficits/detail RUE Deficits / Details:  strength WFL RUE Coordination: decreased gross motor    Lower Extremity Assessment Lower Extremity Assessment: Overall WFL for tasks assessed    Cervical / Trunk Assessment Cervical / Trunk Assessment: Normal   Communication   Communication: No difficulties  Cognition Arousal/Alertness: Awake/alert Behavior During Therapy: Flat affect Overall Cognitive Status: Impaired/Different from baseline Area of Impairment: Safety/judgement;Awareness;Problem solving                         Safety/Judgement: Decreased awareness of deficits Awareness: Emergent Problem Solving: Slow processing        General Comments General comments (skin integrity, edema, etc.): VSS on RA    Exercises     Assessment/Plan    PT Assessment Patient needs continued PT services  PT Problem List Decreased activity tolerance;Decreased balance;Decreased mobility;Decreased coordination;Decreased cognition;Decreased knowledge of use of DME;Decreased safety awareness;Decreased knowledge of precautions;Pain       PT Treatment Interventions DME instruction;Gait training;Stair training;Functional mobility training;Therapeutic activities;Therapeutic exercise;Balance training;Neuromuscular re-education;Cognitive remediation;Patient/family education    PT Goals (Current goals can be found in the Care Plan section)  Acute Rehab PT Goals Patient Stated Goal: To return to independence PT Goal Formulation: With patient Time For Goal Achievement: 10/04/20 Potential to Achieve Goals: Good    Frequency Min 3X/week   Barriers to discharge        Co-evaluation               AM-PAC PT "6 Clicks" Mobility  Outcome Measure Help needed turning from your back to your side while in a flat bed without using bedrails?: None Help needed moving from lying on your back to sitting on the side of a flat bed without using bedrails?: None Help needed moving to and from a bed to a chair (including a wheelchair)?: A Little Help needed standing up from a chair using your arms (e.g., wheelchair or bedside chair)?: A Little Help needed to walk in hospital room?: A Little Help needed climbing 3-5 steps with a railing? : A Lot 6  Click Score: 19    End of Session   Activity Tolerance: Patient tolerated treatment well Patient left: in bed;with call bell/phone within reach;with bed alarm set Nurse Communication: Mobility status PT Visit Diagnosis: Unsteadiness on feet (R26.81);Other abnormalities of gait and mobility (R26.89);Other symptoms and signs involving the nervous system (R29.898)    Time: 9211-9417 PT Time Calculation (min) (ACUTE ONLY): 18 min   Charges:   PT Evaluation $PT Eval Moderate Complexity: 1 Mod         Zenaida Niece, PT, DPT Acute Rehabilitation Pager: [phone]   Zenaida Niece 09/20/2020, 5:28 PM

## 2020-09-21 DIAGNOSIS — I480 Paroxysmal atrial fibrillation: Secondary | ICD-10-CM | POA: Diagnosis not present

## 2020-09-21 LAB — CBC
HCT: 42.3 % (ref 39.0–52.0)
Hemoglobin: 14.4 g/dL (ref 13.0–17.0)
MCH: 30.2 pg (ref 26.0–34.0)
MCHC: 34 g/dL (ref 30.0–36.0)
MCV: 88.7 fL (ref 80.0–100.0)
Platelets: 214 10*3/uL (ref 150–400)
RBC: 4.77 MIL/uL (ref 4.22–5.81)
RDW: 12.9 % (ref 11.5–15.5)
WBC: 16.2 10*3/uL — ABNORMAL HIGH (ref 4.0–10.5)
nRBC: 0 % (ref 0.0–0.2)

## 2020-09-21 LAB — BASIC METABOLIC PANEL
Anion gap: 9 (ref 5–15)
BUN: 22 mg/dL (ref 8–23)
CO2: 23 mmol/L (ref 22–32)
Calcium: 8.5 mg/dL — ABNORMAL LOW (ref 8.9–10.3)
Chloride: 104 mmol/L (ref 98–111)
Creatinine, Ser: 1.1 mg/dL (ref 0.61–1.24)
GFR, Estimated: >60 mL/min (ref >60)
Glucose, Bld: 156 mg/dL — ABNORMAL HIGH (ref 70–99)
Potassium: 4.1 mmol/L (ref 3.5–5.1)
Sodium: 136 mmol/L (ref 135–145)

## 2020-09-21 LAB — GLUCOSE, CAPILLARY
Glucose-Capillary: 131 mg/dL — ABNORMAL HIGH (ref 70–99)
Glucose-Capillary: 148 mg/dL — ABNORMAL HIGH (ref 70–99)
Glucose-Capillary: 138 mg/dL — ABNORMAL HIGH (ref 70–99)
Glucose-Capillary: 136 mg/dL — ABNORMAL HIGH (ref 70–99)

## 2020-09-21 LAB — LACTATE DEHYDROGENASE: LDH: 232 U/L — ABNORMAL HIGH (ref 98–192)

## 2020-09-21 NOTE — Progress Notes (Signed)
PROGRESS NOTE    Mathew Leach  WYO:[id number] DOB: 03-Apr-1951 DOA: 09/12/2020 PCP: Maryella Shivers, MD   Chief Complaint  Patient presents with  . Loss of Consciousness  . Seizures    Brief Narrative: 69 year old male with history of stroke, PFO, PAF, HTN, OSA presented with altered mental status and seizure.  He was seen by his coworker last around 4 PM and appeared fatigued, later was found lying down on the ground and EMS was called, had witnessed seizure during transport.  He was seen in the ED and was admitted 09/12/20, underwent further extensive work-up, seen by neurology. Found to have large cystic left frontal lesion-seen by neurosurgery planning for resection 11/2 11/2: Underwent resection of left frontal lesion, transferred to neuro ICU postop Postop doing well.  Subjective:  Met wife in the hallway and went back to see the patient again.  He had some left arm swelling and improving today . Patient has no new complaints.   Denies nausea vomiting focal weakness numbness tingling   Assessment & Plan:  New onset seizure in the setting of large cystic left frontal lesion: s/p resection of cyst by neurosurgery 11/2-MRI post resection shows gross resected status. Continue Keppra and IV Decadron- taper per neurosurgery.  Continue seizure precaution fall precaution increase activity continue to work with PT OT.  Awaiting for biopsy results to decide the further course.  Possible malignant renal mass seen in the MRI abdomen-reported as exophytic cyst on the lateral midportion of the right kidney with a very thickened nodular wall thickening internal septation limited postcontrast sequences but appears to be some contrast enhancement with mobile components, concerning for renal cell carcinoma, however PER REPORT this lesion is not likely source of brain mass identified by MRI given the absence of lymphadenopathy or other findings of metastatic disease in the abdomen. Oncology is  following closely, awaiting for biopsy from the brain mass to decide further plan.  PAF: in Sinus. Watchman device in place and at home on aspirin held for surgery.per note no clinical recurrence. His home Aspirin held for surgery monitoring telemetry.   Essential hypertension BP borderline controlled, on amlodipine resume home ACE inhibitor.    Prediabetes well-controlled A1c 5.8.  Continue sliding scale insulin. Recent Labs  Lab 09/20/20 0620 09/20/20 1133 09/20/20 1706 09/20/20 2120 09/21/20 0607  GLUCAP 141* 163* 124* 146* 131*   Anion gap metabolic acidosis in the setting of seizure.  Resolved. AKI -it has resolved.  Leukocytosis:likely postop/reactive, he is afebrile, leukocytosis downtrending.Monitor CBC, monitor temperature curve.  Status post perioperative antibiotics.  Nutrition: Diet Order            Diet heart healthy/carb modified Room service appropriate? Yes with Assist; Fluid consistency: Thin  Diet effective now                  Body mass index is 27.16 kg/m.  DVT prophylaxis: SCDs Start: 09/18/20 1832 Place and maintain sequential compression device Start: 09/12/20 2357 Code Status:   Code Status: Full Code  Family Communication: plan of care discussed with patient and his wife at bedside.  Status is: Inpatient  Remains inpatient appropriate because:Ongoing diagnostic testing needed not appropriate for outpatient work up, IV treatments appropriate due to intensity of illness or inability to take PO and Inpatient level of care appropriate due to severity of illness   Dispo: The patient is from: Home              Anticipated d/c is to: Home  Anticipated d/c date is: 3 days              Patient currently is not medically stable to d/c.  Consultants:see note  Procedures:see note  Culture/Microbiology    Component Value Date/Time   SDES URINE, CLEAN CATCH 03/06/2016 1140   SPECREQUEST NONE 03/06/2016 1140   CULT NO GROWTH 1 DAY  03/06/2016 1140   REPTSTATUS 03/07/2016 FINAL 03/06/2016 1140    Other culture-see note  Medications: Scheduled Meds: . amLODipine  10 mg Oral Daily  . Chlorhexidine Gluconate Cloth  6 each Topical Daily  . dexamethasone (DECADRON) injection  4 mg Intravenous Q6H  . insulin aspart  0-15 Units Subcutaneous TID WC  . insulin aspart  0-5 Units Subcutaneous QHS  . levETIRAcetam  1,000 mg Oral BID  . pantoprazole  40 mg Oral QHS  . simvastatin  20 mg Oral q1800   Continuous Infusions: . sodium chloride Stopped (09/19/20 1329)    Antimicrobials: Anti-infectives (From admission, onward)   Start     Dose/Rate Route Frequency Ordered Stop   09/18/20 2100  ceFAZolin (ANCEF) IVPB 2g/100 mL premix        2 g 200 mL/hr over 30 Minutes Intravenous Every 8 hours 09/18/20 1831 09/19/20 0449   09/18/20 1145  ceFAZolin (ANCEF) IVPB 2g/100 mL premix  Status:  Discontinued        2 g 200 mL/hr over 30 Minutes Intravenous 30 min pre-op 09/17/20 1914 09/17/20 1919   09/18/20 1145  ceFAZolin (ANCEF) IVPB 2g/100 mL premix        2 g 200 mL/hr over 30 Minutes Intravenous On call to O.R. 09/17/20 1914 09/18/20 1326     Objective: Vitals: Today's Vitals   09/20/20 2005 09/20/20 2350 09/21/20 0339 09/21/20 0900  BP: (!) 143/74 136/69 (!) 153/86   Pulse: 70 72 71   Resp: (!) 21 (!) 24 14   Temp: 98.6 F (37 C) 99 F (37.2 C) 98.3 F (36.8 C)   TempSrc: Oral Oral Oral   SpO2: 95% 91% 92%   Weight:      Height:      PainSc:    0-No pain   No intake or output data in the 24 hours ending 09/21/20 1104 Filed Weights   09/13/20 1425 09/18/20 1227  Weight: 81 kg 81 kg   Weight change:   Intake/Output from previous day: No intake/output data recorded. Intake/Output this shift: No intake/output data recorded.  Examination: General exam: AAOx3 , NAD, weak appearing.  Dressing around his head. HEENT:Oral mucosa moist, Ear/Nose WNL grossly, dentition normal. Respiratory system: bilaterally  clear,no wheezing or crackles,no use of accessory muscle Cardiovascular system: S1 & S2 +, No JVD,. Gastrointestinal system: Abdomen soft, NT,ND, BS+ Nervous System:Alert, awake, moving extremities and grossly nonfocal Extremities: No edema, distal peripheral pulses palpable.  Skin: No rashes,no icterus. MSK: Normal muscle bulk,tone, powerr  Data Reviewed: I have personally reviewed following labs and imaging studies CBC: Recent Labs  Lab 09/16/20 0134 09/19/20 0457 09/20/20 0143 09/21/20 0218  WBC 13.8* 20.5* 17.1* 16.2*  HGB 12.9* 14.0 15.1 14.4  HCT 39.1 40.8 43.9 42.3  MCV 89.7 88.5 89.4 88.7  PLT 260 236 223 400   Basic Metabolic Panel: Recent Labs  Lab 09/16/20 0134 09/19/20 0457 09/20/20 0143 09/21/20 0218  NA 140 139 140 136  K 4.0 3.6 4.1 4.1  CL 109 106 105 104  CO2 21* '22 25 23  ' GLUCOSE 131* 152* 162* 156*  BUN 24* 22  22 22  CREATININE 1.07 1.01 1.20 1.10  CALCIUM 8.5* 8.0* 8.4* 8.5*   GFR: Estimated Creatinine Clearance: 59.6 mL/min (by C-G formula based on SCr of 1.1 mg/dL). Liver Function Tests: No results for input(s): AST, ALT, ALKPHOS, BILITOT, PROT, ALBUMIN in the last 168 hours. No results for input(s): LIPASE, AMYLASE in the last 168 hours. No results for input(s): AMMONIA in the last 168 hours. Coagulation Profile: No results for input(s): INR, PROTIME in the last 168 hours. Cardiac Enzymes: No results for input(s): CKTOTAL, CKMB, CKMBINDEX, TROPONINI in the last 168 hours. BNP (last 3 results) No results for input(s): PROBNP in the last 8760 hours. HbA1C: No results for input(s): HGBA1C in the last 72 hours. CBG: Recent Labs  Lab 09/20/20 0620 09/20/20 1133 09/20/20 1706 09/20/20 2120 09/21/20 0607  GLUCAP 141* 163* 124* 146* 131*   Lipid Profile: No results for input(s): CHOL, HDL, LDLCALC, TRIG, CHOLHDL, LDLDIRECT in the last 72 hours. Thyroid Function Tests: No results for input(s): TSH, T4TOTAL, FREET4, T3FREE, THYROIDAB in  the last 72 hours. Anemia Panel: No results for input(s): VITAMINB12, FOLATE, FERRITIN, TIBC, IRON, RETICCTPCT in the last 72 hours. Sepsis Labs: No results for input(s): PROCALCITON, LATICACIDVEN in the last 168 hours.  Recent Results (from the past 240 hour(s))  Respiratory Panel by RT PCR (Flu A&B, Covid) - Nasopharyngeal Swab     Status: None   Collection Time: 09/12/20 10:11 PM   Specimen: Nasopharyngeal Swab  Result Value Ref Range Status   SARS Coronavirus 2 by RT PCR NEGATIVE NEGATIVE Final    Comment: (NOTE) SARS-CoV-2 target nucleic acids are NOT DETECTED.  The SARS-CoV-2 RNA is generally detectable in upper respiratoy specimens during the acute phase of infection. The lowest concentration of SARS-CoV-2 viral copies this assay can detect is 131 copies/mL. A negative result does not preclude SARS-Cov-2 infection and should not be used as the sole basis for treatment or other patient management decisions. A negative result may occur with  improper specimen collection/handling, submission of specimen other than nasopharyngeal swab, presence of viral mutation(s) within the areas targeted by this assay, and inadequate number of viral copies (<131 copies/mL). A negative result must be combined with clinical observations, patient history, and epidemiological information. The expected result is Negative.  Fact Sheet for Patients:  PinkCheek.be  Fact Sheet for Healthcare Providers:  GravelBags.it  This test is no t yet approved or cleared by the [place] FDA and  has been authorized for detection and/or diagnosis of SARS-CoV-2 by FDA under an Emergency Use Authorization (EUA). This EUA will remain  in effect (meaning this test can be used) for the duration of the COVID-19 declaration under Section 564(b)(1) of the Act, 21 U.S.C. section 360bbb-3(b)(1), unless the authorization is terminated or revoked sooner.       Influenza A by PCR NEGATIVE NEGATIVE Final   Influenza B by PCR NEGATIVE NEGATIVE Final    Comment: (NOTE) The Xpert Xpress SARS-CoV-2/FLU/RSV assay is intended as an aid in  the diagnosis of influenza from Nasopharyngeal swab specimens and  should not be used as a sole basis for treatment. Nasal washings and  aspirates are unacceptable for Xpert Xpress SARS-CoV-2/FLU/RSV  testing.  Fact Sheet for Patients: PinkCheek.be  Fact Sheet for Healthcare Providers: GravelBags.it  This test is not yet approved or cleared by the [place] FDA and  has been authorized for detection and/or diagnosis of SARS-CoV-2 by  FDA under an Emergency Use Authorization (EUA). This EUA will remain  in effect (meaning this test can be used) for the duration of the  Covid-19 declaration under Section 564(b)(1) of the Act, 21  U.S.C. section 360bbb-3(b)(1), unless the authorization is  terminated or revoked. Performed at [facility], [place] [address], [place], [place] [address]   MRSA PCR Screening     Status: None   Collection Time: 09/15/20  9:27 AM   Specimen: Nasopharyngeal  Result Value Ref Range Status   MRSA by PCR NEGATIVE NEGATIVE Final    Comment:        The GeneXpert MRSA Assay (FDA approved for NASAL specimens only), is one component of a comprehensive MRSA colonization surveillance program. It is not intended to diagnose MRSA infection nor to guide or monitor treatment for MRSA infections. Performed at [facility], [place] [address], [place], [place] [address]      Radiology Studies: No results found.   LOS: 8 days   Antonieta Pert, MD [facility]  09/21/2020, 11:04 AM

## 2020-09-21 NOTE — Progress Notes (Signed)
  NEUROSURGERY PROGRESS NOTE   No issues overnight. Pt without any complaints this afternoon.  EXAM:  BP (!) 145/94 (BP Location: Left Arm)   Pulse 69   Temp 98.8 F (37.1 C) (Oral)   Resp 20   Ht 5' 7.99" (1.727 m)   Wt 81 kg   SpO2 95%   BMI 27.16 kg/m   Awake, alert, oriented  Speech fluent, appropriate  CN grossly intact  5/5 BUE/BLE, minimal right drift Wound c/d/i  IMPRESSION:  69 y.o. male POD# 3 s/p left frontal tumor resection. Awaiting pathology, has concurrent renal mass. Neurologically stable, no further SZ on 1000mg  Keppra BID.  PLAN: - Cont to mobilize - Stable for d/c home from neurosurgical standpoint, can f/u in office in 2 weeks

## 2020-09-21 NOTE — Progress Notes (Signed)
Physical Therapy Treatment Patient Details Name: Mathew Leach MRN: [id number] DOB: [birth year] Today's Date: 09/21/2020    History of Present Illness 69 year old male with history of stroke, PFO, PAF, HTN, OSA presented with altered mental status and seizure.  He was seen by his coworker last around 4 PM and appeared fatigued, later was found lying down on the ground and EMS was called, had witnessed seizure during transport. Pt found to have large L frontal cystic lesion. Pt underwent L frontal crani and tumor resection on 09/18/2020.    PT Comments    Pt tolerates treatment well, with improved balance when ambulating with UE support of RW. Pt continues to demonstrate reduced gait speed despite PT cues, and slowed cognitive processing during session. Pt is able to initiate dynamic balance training, performing dressing tasks with assistance from PT. Pt will benefit from continued aggressive mobilization and PT POC to improve balance and mobility quality. PT continues to recommend discharge home with HHPT and a 3in1 commode. PT recommends use of RW for all OOB mobility.  Follow Up Recommendations  Home health PT;Supervision/Assistance - 24 hour     Equipment Recommendations  3in1 (PT) (pt owns RW per report)    Recommendations for Other Services       Precautions / Restrictions Precautions Precautions: Fall Restrictions Weight Bearing Restrictions: No    Mobility  Bed Mobility Overal bed mobility: Modified Independent Bed Mobility: Supine to Sit     Supine to sit: Modified independent (Device/Increase time)     General bed mobility comments: increased time and elevated HOB  Transfers Overall transfer level: Needs assistance Equipment used: None;Rolling walker (2 wheeled) Transfers: Sit to/from Stand Sit to Stand: Min guard;Supervision         General transfer comment: minG without device, supervision with RW  Ambulation/Gait Ambulation/Gait assistance: Min guard Gait  Distance (Feet): 100 Feet (additional trial of 50' without device) Assistive device: None;Rolling walker (2 wheeled) Gait Pattern/deviations: Step-to pattern Gait velocity: reduced Gait velocity interpretation: <1.8 ft/sec, indicate of risk for recurrent falls General Gait Details: pt with slowed step to gait, increased sway with 2-3 minor losses of balance without UE support. Pt with improved balance with use of RW, however pt remains with slowed gait speed despite PT cues to increase step length   Stairs             Wheelchair Mobility    Modified Rankin (Stroke Patients Only)       Balance Overall balance assessment: Needs assistance Sitting-balance support: No upper extremity supported;Feet supported Sitting balance-Leahy Scale: Good Sitting balance - Comments: supervision   Standing balance support: No upper extremity supported Standing balance-Leahy Scale: Poor Standing balance comment: pt requires minG or very close supervision to perform dynamic balance tasks without UE support                            Cognition Arousal/Alertness: Awake/alert Behavior During Therapy: Flat affect Overall Cognitive Status: Impaired/Different from baseline                               Problem Solving: Slow processing        Exercises      General Comments General comments (skin integrity, edema, etc.): VSS on RA      Pertinent Vitals/Pain Pain Assessment: No/denies pain    Home Living  Prior Function            PT Goals (current goals can now be found in the care plan section) Acute Rehab PT Goals Patient Stated Goal: To return to independence Progress towards PT goals: Progressing toward goals    Frequency    Min 3X/week      PT Plan Current plan remains appropriate    Co-evaluation              AM-PAC PT "6 Clicks" Mobility   Outcome Measure  Help needed turning from your back to your  side while in a flat bed without using bedrails?: None Help needed moving from lying on your back to sitting on the side of a flat bed without using bedrails?: None Help needed moving to and from a bed to a chair (including a wheelchair)?: None Help needed standing up from a chair using your arms (e.g., wheelchair or bedside chair)?: None Help needed to walk in hospital room?: A Little Help needed climbing 3-5 steps with a railing? : A Lot 6 Click Score: 21    End of Session Equipment Utilized During Treatment: Gait belt Activity Tolerance: Patient tolerated treatment well Patient left: in chair;with call bell/phone within reach;with chair alarm set;with family/visitor present Nurse Communication: Mobility status PT Visit Diagnosis: Unsteadiness on feet (R26.81);Other abnormalities of gait and mobility (R26.89);Other symptoms and signs involving the nervous system (R29.898)     Time: 5621-3086 PT Time Calculation (min) (ACUTE ONLY): 30 min  Charges:  $Gait Training: 8-22 mins $Therapeutic Activity: 8-22 mins                     Zenaida Niece, PT, DPT Acute Rehabilitation Pager: [phone]    Zenaida Niece 09/21/2020, 3:23 PM

## 2020-09-22 DIAGNOSIS — I480 Paroxysmal atrial fibrillation: Secondary | ICD-10-CM | POA: Diagnosis not present

## 2020-09-22 LAB — GLUCOSE, CAPILLARY
Glucose-Capillary: 143 mg/dL — ABNORMAL HIGH (ref 70–99)
Glucose-Capillary: 164 mg/dL — ABNORMAL HIGH (ref 70–99)
Glucose-Capillary: 114 mg/dL — ABNORMAL HIGH (ref 70–99)
Glucose-Capillary: 164 mg/dL — ABNORMAL HIGH (ref 70–99)

## 2020-09-22 LAB — LACTATE DEHYDROGENASE: LDH: 226 U/L — ABNORMAL HIGH (ref 98–192)

## 2020-09-22 MED ORDER — DEXAMETHASONE 4 MG PO TABS
4.0000 mg | ORAL_TABLET | Freq: Three times a day (TID) | ORAL | Status: DC
  Administered 2020-09-22 – 2020-09-23 (×4): 4 mg via ORAL
  Filled 2020-09-22 (×4): qty 1

## 2020-09-22 NOTE — Progress Notes (Signed)
Physical Therapy Treatment Patient Details Name: Mathew Leach MRN: [id number] DOB: [birth year] Today's Date: 09/22/2020    History of Present Illness 69 year old male with history of stroke, PFO, PAF, HTN, OSA presented with altered mental status and seizure.  He was seen by his coworker last around 4 PM and appeared fatigued, later was found lying down on the ground and EMS was called, had witnessed seizure during transport. Pt found to have large L frontal cystic lesion. Pt underwent L frontal crani and tumor resection on 09/18/2020.    PT Comments    Pt tolerates treatment well with improved ambulation tolerance. Pt progresses to completing stair training with use of rails, no significant balance deviations noted. Pt does continue to demonstrate a slowed gait speed despite PT cues for increased step length. Pt will benefit from continued acute PT POC to improve dynamic gait and balance and aide in a return to independence. PT continues to recommend discharge home with HHPT and a 3in1 commode.   Follow Up Recommendations  Home health PT;Supervision/Assistance - 24 hour     Equipment Recommendations  3in1 (PT)    Recommendations for Other Services       Precautions / Restrictions Precautions Precautions: Fall Restrictions Weight Bearing Restrictions: No    Mobility  Bed Mobility                  Transfers Overall transfer level: Needs assistance Equipment used: Rolling walker (2 wheeled) Transfers: Sit to/from Stand Sit to Stand: Supervision            Ambulation/Gait Ambulation/Gait assistance: Min guard;Supervision (1 instance of minG with minor LOB) Gait Distance (Feet): 150 Feet Assistive device: Rolling walker (2 wheeled) Gait Pattern/deviations: Step-to pattern Gait velocity: reduced Gait velocity interpretation: <1.8 ft/sec, indicate of risk for recurrent falls General Gait Details: pt with short step-to gait, increased trunk flexion and reduce gait  speed   Stairs Stairs: Yes Stairs assistance: Min guard Stair Management: Alternating pattern;Step to pattern;Two rails (alternating up, step-to down) Number of Stairs: 2 General stair comments: 2 steps x3 consecutive trials for 6 steps total   Wheelchair Mobility    Modified Rankin (Stroke Patients Only)       Balance Overall balance assessment: Needs assistance Sitting-balance support: No upper extremity supported;Feet supported Sitting balance-Leahy Scale: Good     Standing balance support: Single extremity supported;During functional activity Standing balance-Leahy Scale: Poor Standing balance comment: reliant on UE support or minG assist                            Cognition Arousal/Alertness: Awake/alert Behavior During Therapy: Flat affect Overall Cognitive Status: Impaired/Different from baseline                               Problem Solving: Slow processing        Exercises      General Comments General comments (skin integrity, edema, etc.): VSS on RA      Pertinent Vitals/Pain Pain Assessment: No/denies pain    Home Living                      Prior Function            PT Goals (current goals can now be found in the care plan section) Acute Rehab PT Goals Patient Stated Goal: To return to independence Progress towards PT goals:  Progressing toward goals    Frequency    Min 3X/week      PT Plan Current plan remains appropriate    Co-evaluation              AM-PAC PT "6 Clicks" Mobility   Outcome Measure  Help needed turning from your back to your side while in a flat bed without using bedrails?: None Help needed moving from lying on your back to sitting on the side of a flat bed without using bedrails?: None Help needed moving to and from a bed to a chair (including a wheelchair)?: None Help needed standing up from a chair using your arms (e.g., wheelchair or bedside chair)?: None Help needed  to walk in hospital room?: A Little Help needed climbing 3-5 steps with a railing? : A Little 6 Click Score: 22    End of Session Equipment Utilized During Treatment: Gait belt Activity Tolerance: Patient tolerated treatment well Patient left: in chair;with call bell/phone within reach;with chair alarm set Nurse Communication: Mobility status PT Visit Diagnosis: Unsteadiness on feet (R26.81);Other abnormalities of gait and mobility (R26.89);Other symptoms and signs involving the nervous system (U98.119)     Time: 1478-2956 PT Time Calculation (min) (ACUTE ONLY): 20 min  Charges:  $Gait Training: 8-22 mins                     Zenaida Niece, PT, DPT Acute Rehabilitation Pager: [phone]    Zenaida Niece 09/22/2020, 4:11 PM

## 2020-09-22 NOTE — Evaluation (Signed)
Occupational Therapy Evaluation Patient Details Name: Mathew Leach MRN: [id number] DOB: 05-04-1951 Today's Date: 09/22/2020    History of Present Illness 69 year old male with history of stroke, PFO, PAF, HTN, OSA presented with altered mental status and seizure.  He was seen by his coworker last around 4 PM and appeared fatigued, later was found lying down on the ground and EMS was called, had witnessed seizure during transport. Pt found to have large L frontal cystic lesion. Pt underwent L frontal crani and tumor resection on 09/18/2020.   Clinical Impression   This 69 y/o male presents with the above. PTA pt reports being independent with ADL and functional mobility, living with spouse. Pt currently presenting with the above and below listed deficits including impaired balance, activity tolerance and RUE fine motor strength/coordination. Pt performing room level mobility tasks using RW with close minguard - minA throughout. He requires up to minA for LB and standing grooming ADL. VSS on RA throughout. Pt reports plans to return home with spouse at time of discharge. He will benefit from continued acute OT services and currently recommend follow up [facility] services after discharge to maximize his overall safety and independence with ADL and mobility.     Follow Up Recommendations  Home health OT;Supervision/Assistance - 24 hour    Equipment Recommendations  3 in 1 bedside commode           Precautions / Restrictions Precautions Precautions: Fall Restrictions Weight Bearing Restrictions: No      Mobility Bed Mobility Overal bed mobility: Modified Independent Bed Mobility: Supine to Sit     Supine to sit: Modified independent (Device/Increase time)     General bed mobility comments: increased time and elevated HOB    Transfers Overall transfer level: Needs assistance Equipment used: Rolling walker (2 wheeled);None Transfers: Sit to/from Stand Sit to Stand: Min assist;Min  guard         General transfer comment: required boosting assist to achieve full standing position from EOB, minguard from [facility] [place] over toilet; VCs for safe hand placement     Balance Overall balance assessment: Needs assistance Sitting-balance support: No upper extremity supported;Feet supported Sitting balance-Leahy Scale: Good     Standing balance support: No upper extremity supported;During functional activity Standing balance-Leahy Scale: Poor Standing balance comment: pt requires minG or very close supervision to perform dynamic balance tasks without UE support                           ADL either performed or assessed with clinical judgement   ADL Overall ADL's : Needs assistance/impaired Eating/Feeding: Set up;Sitting   Grooming: Minimal assistance;Standing;Oral care;Wash/dry hands Grooming Details (indicate cue type and reason): close minguard to minA for standing balance as pt with posterior bias, x2 standing tasks  Upper Body Bathing: Min guard;Sitting   Lower Body Bathing: Minimal assistance;Sit to/from stand   Upper Body Dressing : Min guard;Sitting   Lower Body Dressing: Minimal assistance;Sit to/from stand Lower Body Dressing Details (indicate cue type and reason): assist to thread RLE into underwear, minguard for standing balance while pt advances over hips  Toilet Transfer: Min guard;Ambulation;RW;BSC Toilet Transfer Details (indicate cue type and reason): BSC over toilet  Toileting- Clothing Manipulation and Hygiene: Min guard;Sit to/from stand       Functional mobility during ADLs: Min guard (minguard to light minA for balance)       Vision Baseline Vision/History: Wears glasses Wears Glasses: At all times Additional Comments: not formally  assessed, will continue to assess within functional context      Perception     Praxis      Pertinent Vitals/Pain Pain Assessment: No/denies pain     Hand Dominance Right   Extremity/Trunk  Assessment Upper Extremity Assessment Upper Extremity Assessment: RUE deficits/detail RUE Deficits / Details: shoulder strength grossly 4-/5; gross decrease in fine motor strength/coordination RUE Coordination: decreased fine motor   Lower Extremity Assessment Lower Extremity Assessment: Defer to PT evaluation   Cervical / Trunk Assessment Cervical / Trunk Assessment: Normal   Communication Communication Communication: No difficulties   Cognition Arousal/Alertness: Awake/alert Behavior During Therapy: Flat affect Overall Cognitive Status: Impaired/Different from baseline Area of Impairment: Safety/judgement;Awareness;Problem solving                         Safety/Judgement: Decreased awareness of deficits Awareness: Emergent Problem Solving: Slow processing;Decreased initiation General Comments: slow to initiate functional tasks, occasionally requires cues to proceed to next step of task    General Comments  VSS on RA    Exercises     Shoulder Instructions      Home Living Family/patient expects to be discharged to:: Private residence Living Arrangements: Spouse/significant other Available Help at Discharge: Family;Available 24 hours/day Type of Home: House Home Access: Stairs to enter [employer] of Steps: 4 Entrance Stairs-Rails: Right Home Layout: One level     Bathroom Shower/Tub: Occupational psychologist: Standard     Home Equipment: Environmental consultant - 2 wheels;Cane - single point          Prior Functioning/Environment Level of Independence: Independent        Comments: pt works for a Radiation protection practitioner in [employer]        OT Problem List: Decreased strength;Decreased range of motion;Decreased activity tolerance;Impaired balance (sitting and/or standing);Decreased coordination;Decreased cognition;Decreased safety awareness;Decreased knowledge of precautions;Impaired UE functional use;Decreased knowledge of use of DME or AE      OT  Treatment/Interventions: Self-care/ADL training;Therapeutic exercise;Energy conservation;DME and/or AE instruction;Therapeutic activities;Cognitive remediation/compensation;Patient/family education;Balance training;Visual/perceptual remediation/compensation    OT Goals(Current goals can be found in the care plan section) Acute Rehab OT Goals Patient Stated Goal: To return to independence OT Goal Formulation: With patient Time For Goal Achievement: 10/06/20 Potential to Achieve Goals: Good  OT Frequency: Min 2X/week   Barriers to D/C:            Co-evaluation              AM-PAC OT "6 Clicks" Daily Activity     Outcome Measure Help from another person eating meals?: None Help from another person taking care of personal grooming?: A Little Help from another person toileting, which includes using toliet, bedpan, or urinal?: A Little Help from another person bathing (including washing, rinsing, drying)?: A Little Help from another person to put on and taking off regular upper body clothing?: A Little Help from another person to put on and taking off regular lower body clothing?: A Little 6 Click Score: 19   End of Session Equipment Utilized During Treatment: Gait belt;Rolling walker Nurse Communication: Mobility status  Activity Tolerance: Patient tolerated treatment well Patient left: in chair;with call bell/phone within reach;with chair alarm set  OT Visit Diagnosis: Unsteadiness on feet (R26.81);Other symptoms and signs involving the nervous system (R29.898);Other symptoms and signs involving cognitive function;Muscle weakness (generalized) (M62.81)                Time: 3825-0539 OT Time Calculation (min):  30 min Charges:  OT General Charges $OT Visit: 1 Visit OT Evaluation $OT Eval Moderate Complexity: 1 Mod OT Treatments $Self Care/Home Management : 8-22 mins  Lou Cal, OT Acute Rehabilitation Services Pager [phone] Office [phone]   Raymondo Band 09/22/2020, 10:09 AM

## 2020-09-22 NOTE — Progress Notes (Signed)
PROGRESS NOTE    Mathew Leach  TMH:[id number] DOB: 1951-09-20 DOA: 09/12/2020 PCP: Maryella Shivers, MD   Chief Complaint  Patient presents with  . Loss of Consciousness  . Seizures    Brief Narrative: 69 year old male with history of stroke, PFO, PAF, HTN, OSA presented with altered mental status and seizure.  He was seen by his coworker last around 4 PM and appeared fatigued, later was found lying down on the ground and EMS was called, had witnessed seizure during transport.  He was seen in the ED and was admitted 09/12/20, underwent further extensive work-up, seen by neurology. Found to have large cystic left frontal lesion-seen by neurosurgery planning for resection 11/2 11/2: Underwent resection of left frontal lesion, transferred to neuro ICU postop Postop doing well. Seen by PT OT has recommended home health PT 24 supervision, 3in 1 which patient has his own  Subjective:  No new complaints.  No acute events overnight He is resting comfortably on the bedside chair.  Assessment & Plan:  New onset seizure in the setting of large cystic left frontal lesion: s/p resection of cyst by neurosurgery 11/2-MRI post resection shows gross resected status.  Stable neurosurgically, working with PT OT.  Continue on Decadron taper- changed to p.o. today every 8 hours, continue Keppra 1000 mg twice daily, seizure precaution fall precaution. Awaiting for biopsy results to decide the further course.  Possible malignant renal mass seen in the MRI abdomen-reported as exophytic cyst on the lateral midportion of the right kidney with a very thickened nodular wall thickening internal septation limited postcontrast sequences but appears to be some contrast enhancement with mobile components, concerning for renal cell carcinoma, however PER REPORT this lesion is not likely source of brain mass identified by MRI given the absence of lymphadenopathy or other findings of metastatic disease in the abdomen.  If  brain mass does not DVT renal answers he will need urology consult  PAF: S/p watchman device in place, on aspirin at home.Continue to monitor here in telemetry.  Aspirin was held for surgery.  Essential hypertension BP well controlled after resuming his home ACE inhibitor, continue his home amlodipine.    Prediabetes well-controlled A1c 5.8.  Continue sliding scale insulin. Recent Labs  Lab 09/21/20 0607 09/21/20 1134 09/21/20 1600 09/21/20 2116 09/22/20 0615  GLUCAP 131* 148* 138* 136* 143*   Anion gap metabolic acidosis in the setting of seizure.Resolved.  AKI:it has resolved.  Leukocytosis:likely postop/reactive,he is afebrile, leukocytosis downtrending.  Nutrition: Diet Order            Diet heart healthy/carb modified Room service appropriate? Yes with Assist; Fluid consistency: Thin  Diet effective now                  Body mass index is 27.16 kg/m.  DVT prophylaxis: SCDs Start: 09/18/20 1832 Place and maintain sequential compression device Start: 09/12/20 2357 Code Status:   Code Status: Full Code  Family Communication: plan of care discussed with patient and his wife at bedside.  Status is: Inpatient  Remains inpatient appropriate because:Ongoing diagnostic testing needed not appropriate for outpatient work up, IV treatments appropriate due to intensity of illness or inability to take PO and Inpatient level of care appropriate due to severity of illness   Dispo: The patient is from: Home              Anticipated d/c is to: Home              Anticipated d/c date  is: 2 days-once okay with oncology pending biopsy              Patient currently is not medically stable to d/c.  Consultants:see note  Procedures:see note  Culture/Microbiology    Component Value Date/Time   SDES URINE, CLEAN CATCH 03/06/2016 1140   SPECREQUEST NONE 03/06/2016 1140   CULT NO GROWTH 1 DAY 03/06/2016 1140   REPTSTATUS 03/07/2016 FINAL 03/06/2016 1140    Other culture-see  note  Medications: Scheduled Meds: . amLODipine  10 mg Oral Daily  . Chlorhexidine Gluconate Cloth  6 each Topical Daily  . dexamethasone  4 mg Oral Q8H  . insulin aspart  0-15 Units Subcutaneous TID WC  . insulin aspart  0-5 Units Subcutaneous QHS  . levETIRAcetam  1,000 mg Oral BID  . pantoprazole  40 mg Oral QHS  . simvastatin  20 mg Oral q1800   Continuous Infusions: . sodium chloride Stopped (09/19/20 1329)    Antimicrobials: Anti-infectives (From admission, onward)   Start     Dose/Rate Route Frequency Ordered Stop   09/18/20 2100  ceFAZolin (ANCEF) IVPB 2g/100 mL premix        2 g 200 mL/hr over 30 Minutes Intravenous Every 8 hours 09/18/20 1831 09/19/20 0449   09/18/20 1145  ceFAZolin (ANCEF) IVPB 2g/100 mL premix  Status:  Discontinued        2 g 200 mL/hr over 30 Minutes Intravenous 30 min pre-op 09/17/20 1914 09/17/20 1919   09/18/20 1145  ceFAZolin (ANCEF) IVPB 2g/100 mL premix        2 g 200 mL/hr over 30 Minutes Intravenous On call to O.R. 09/17/20 1914 09/18/20 1326     Objective: Vitals: Today's Vitals   09/21/20 1900 09/21/20 1905 09/22/20 0020 09/22/20 0335  BP: 128/81  119/72 110/73  Pulse: 72  67 68  Resp: 19  17 19   Temp: 98.9 F (37.2 C)  98.2 F (36.8 C) 98.5 F (36.9 C)  TempSrc: Oral  Oral Oral  SpO2: 94%  97% 96%  Weight:      Height:      PainSc: 0-No pain 0-No pain 0-No pain 0-No pain    Intake/Output Summary (Last 24 hours) at 09/22/2020 0750 Last data filed at 09/21/2020 2212 Gross per 24 hour  Intake 240 ml  Output --  Net 240 ml   Filed Weights   09/13/20 1425 09/18/20 1227  Weight: 81 kg 81 kg   Weight change:   Intake/Output from previous day: 11/05 0701 - 11/06 0700 In: 240 [P.O.:240] Out: -  Intake/Output this shift: No intake/output data recorded.  Examination: General exam: AAOx3 , NAD, weak appearing. HEENT:Oral mucosa moist, Ear/Nose WNL grossly, dentition normal. Respiratory system: bilaterally clear,no  wheezing or crackles,no use of accessory muscle Cardiovascular system: S1 & S2 +, No JVD,. Gastrointestinal system: Abdomen soft, NT,ND, BS+ Nervous System:Alert, awake, moving extremities and grossly nonfocal Extremities: No edema, distal peripheral pulses palpable.  Skin: No rashes,no icterus.  Left hand/scalp wound is dry clean and intact with staples, off dressing.  MSK: Normal muscle bulk,tone, power  Data Reviewed: I have personally reviewed following labs and imaging studies CBC: Recent Labs  Lab 09/16/20 0134 09/19/20 0457 09/20/20 0143 09/21/20 0218  WBC 13.8* 20.5* 17.1* 16.2*  HGB 12.9* 14.0 15.1 14.4  HCT 39.1 40.8 43.9 42.3  MCV 89.7 88.5 89.4 88.7  PLT 260 236 223 101   Basic Metabolic Panel: Recent Labs  Lab 09/16/20 0134 09/19/20 0457 09/20/20 0143 09/21/20  0218  NA 140 139 140 136  K 4.0 3.6 4.1 4.1  CL 109 106 105 104  CO2 21* 22 25 23   GLUCOSE 131* 152* 162* 156*  BUN 24* 22 22 22   CREATININE 1.07 1.01 1.20 1.10  CALCIUM 8.5* 8.0* 8.4* 8.5*   GFR: Estimated Creatinine Clearance: 59.6 mL/min (by C-G formula based on SCr of 1.1 mg/dL). Liver Function Tests: No results for input(s): AST, ALT, ALKPHOS, BILITOT, PROT, ALBUMIN in the last 168 hours. No results for input(s): LIPASE, AMYLASE in the last 168 hours. No results for input(s): AMMONIA in the last 168 hours. Coagulation Profile: No results for input(s): INR, PROTIME in the last 168 hours. Cardiac Enzymes: No results for input(s): CKTOTAL, CKMB, CKMBINDEX, TROPONINI in the last 168 hours. BNP (last 3 results) No results for input(s): PROBNP in the last 8760 hours. HbA1C: No results for input(s): HGBA1C in the last 72 hours. CBG: Recent Labs  Lab 09/21/20 0607 09/21/20 1134 09/21/20 1600 09/21/20 2116 09/22/20 0615  GLUCAP 131* 148* 138* 136* 143*   Lipid Profile: No results for input(s): CHOL, HDL, LDLCALC, TRIG, CHOLHDL, LDLDIRECT in the last 72 hours. Thyroid Function Tests: No  results for input(s): TSH, T4TOTAL, FREET4, T3FREE, THYROIDAB in the last 72 hours. Anemia Panel: No results for input(s): VITAMINB12, FOLATE, FERRITIN, TIBC, IRON, RETICCTPCT in the last 72 hours. Sepsis Labs: No results for input(s): PROCALCITON, LATICACIDVEN in the last 168 hours.  Recent Results (from the past 240 hour(s))  Respiratory Panel by RT PCR (Flu A&B, Covid) - Nasopharyngeal Swab     Status: None   Collection Time: 09/12/20 10:11 PM   Specimen: Nasopharyngeal Swab  Result Value Ref Range Status   SARS Coronavirus 2 by RT PCR NEGATIVE NEGATIVE Final    Comment: (NOTE) SARS-CoV-2 target nucleic acids are NOT DETECTED.  The SARS-CoV-2 RNA is generally detectable in upper respiratoy specimens during the acute phase of infection. The lowest concentration of SARS-CoV-2 viral copies this assay can detect is 131 copies/mL. A negative result does not preclude SARS-Cov-2 infection and should not be used as the sole basis for treatment or other patient management decisions. A negative result may occur with  improper specimen collection/handling, submission of specimen other than nasopharyngeal swab, presence of viral mutation(s) within the areas targeted by this assay, and inadequate number of viral copies (<131 copies/mL). A negative result must be combined with clinical observations, patient history, and epidemiological information. The expected result is Negative.  Fact Sheet for Patients:  PinkCheek.be  Fact Sheet for Healthcare Providers:  GravelBags.it  This test is no t yet approved or cleared by the [place] FDA and  has been authorized for detection and/or diagnosis of SARS-CoV-2 by FDA under an Emergency Use Authorization (EUA). This EUA will remain  in effect (meaning this test can be used) for the duration of the COVID-19 declaration under Section 564(b)(1) of the Act, 21 U.S.C. section 360bbb-3(b)(1),  unless the authorization is terminated or revoked sooner.     Influenza A by PCR NEGATIVE NEGATIVE Final   Influenza B by PCR NEGATIVE NEGATIVE Final    Comment: (NOTE) The Xpert Xpress SARS-CoV-2/FLU/RSV assay is intended as an aid in  the diagnosis of influenza from Nasopharyngeal swab specimens and  should not be used as a sole basis for treatment. Nasal washings and  aspirates are unacceptable for Xpert Xpress SARS-CoV-2/FLU/RSV  testing.  Fact Sheet for Patients: PinkCheek.be  Fact Sheet for Healthcare Providers: GravelBags.it  This test is not  yet approved or cleared by the [place] and  has been authorized for detection and/or diagnosis of SARS-CoV-2 by  FDA under an Emergency Use Authorization (EUA). This EUA will remain  in effect (meaning this test can be used) for the duration of the  Covid-19 declaration under Section 564(b)(1) of the Act, 21  U.S.C. section 360bbb-3(b)(1), unless the authorization is  terminated or revoked. Performed at [facility], [place] [address] [place], [place] [address]   MRSA PCR Screening     Status: None   Collection Time: 09/15/20  9:27 AM   Specimen: Nasopharyngeal  Result Value Ref Range Status   MRSA by PCR NEGATIVE NEGATIVE Final    Comment:        The GeneXpert MRSA Assay (FDA approved for NASAL specimens only), is one component of a comprehensive MRSA colonization surveillance program. It is not intended to diagnose MRSA infection nor to guide or monitor treatment for MRSA infections. Performed at [facility], [place] [address] [place], [place] [address]      Radiology Studies: No results found.   LOS: 9 days   Antonieta Pert, MD [facility]  09/22/2020, 7:50 AM

## 2020-09-23 DIAGNOSIS — I480 Paroxysmal atrial fibrillation: Secondary | ICD-10-CM | POA: Diagnosis not present

## 2020-09-23 LAB — LACTATE DEHYDROGENASE: LDH: 396 U/L — ABNORMAL HIGH (ref 98–192)

## 2020-09-23 LAB — GLUCOSE, CAPILLARY
Glucose-Capillary: 124 mg/dL — ABNORMAL HIGH (ref 70–99)
Glucose-Capillary: 110 mg/dL — ABNORMAL HIGH (ref 70–99)

## 2020-09-23 MED ORDER — AMLODIPINE BESYLATE 10 MG PO TABS: 10.0000 mg | ORAL_TABLET | Freq: Every day | ORAL | 0 refills | Status: DC

## 2020-09-23 MED ORDER — PANTOPRAZOLE SODIUM 40 MG PO TBEC: 40.0000 mg | DELAYED_RELEASE_TABLET | Freq: Every day | ORAL | 0 refills | Status: DC

## 2020-09-23 MED ORDER — LEVETIRACETAM 1000 MG PO TABS: 1000.0000 mg | ORAL_TABLET | Freq: Two times a day (BID) | ORAL | 0 refills | Status: DC

## 2020-09-23 MED ORDER — DEXAMETHASONE 4 MG PO TABS: 4.0000 mg | ORAL_TABLET | Freq: Two times a day (BID) | ORAL | 0 refills | Status: AC

## 2020-09-23 NOTE — TOC Transition Note (Signed)
Transition of Care [facility]) - CM/SW Discharge Note   Patient Details  Name: Payam Gribble MRN: [id number] Date of Birth: [birth year]  [facility] CM/SW Contact:  Carles Collet, RN Phone Number: 09/23/2020, 11:47 AM   Clinical Narrative:    Spoke to patient over the phone, he is agreeable to [facility] services. Referral placed to [facility]. Patient states that he has RW and any needed DME at home. No other CM needs identified.     Final next level of care: [place] Barriers to Discharge: No Barriers Identified   Patient Goals and CMS Choice Patient states their goals for this hospitalization and ongoing recovery are:: to go  home CMS Medicare.gov Compare Post Acute Care list provided to:: Patient Choice offered to / list presented to : Patient  Discharge Placement                       Discharge Plan and Services                          HH Arranged: PT, OT [facility] Agency: [place] ([facility]) Date [place]: 09/23/20 Time [place]: 0102 Representative spoke with at [place]: weekend coverage, Vaughan Basta  Social Determinants of Health (SDOH) Interventions     Readmission Risk Interventions No flowsheet data found.

## 2020-09-23 NOTE — Discharge Summary (Signed)
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 vessel flow voids at the skull base are preserved. Skull and upper cervical spine: Normal marrow signal is preserved. Sinuses/Orbits: Chronic right sphenoid sinusitis. Orbits are unremarkable. Other: Sella is unremarkable.  Mastoid air cells are clear. IMPRESSION: Motion degraded study. 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 Second/satellite subcentimeter adjacent lesion. Differential considerations are metastasis and primary high-grade neoplasm. Dural thickening or thin subdural collection along the right cerebral convexity at the vertex. Electronically Signed   By: Macy Mis M.D.   On: 09/13/2020 13:16   MR ABDOMEN W WO CONTRAST  Result Date: 09/14/2020 CLINICAL DATA:  Brain lesion, possible renal mass identified on prior CT EXAM: MRI ABDOMEN WITHOUT AND WITH CONTRAST TECHNIQUE: Multiplanar multisequence MR imaging of the abdomen was performed both before and after the administration of intravenous contrast. CONTRAST:  80mL GADAVIST GADOBUTROL 1 MMOL/ML IV SOLN COMPARISON:  CT abdomen pelvis, 09/13/2020 FINDINGS: Examination is generally limited by motion artifact throughout,  particularly on postcontrast sequences. Lower chest: No acute findings. Hepatobiliary: No mass or other parenchymal abnormality identified. Nonenhancing lobulated cyst of the posterior right lobe of the liver (series 5, image 3) Pancreas: No mass, inflammatory changes, or other parenchymal abnormality identified. Spleen:  Within normal limits in size and appearance. Adrenals/Urinary Tract: Numerous renal cysts cysts bilaterally, particularly large exophytic cysts arising from the inferior poles of the bilateral kidneys. There is a cyst of the lateral midportion of the right kidney with a very thickened, nodular wall and thickened internal septations (series 4, image 8). Post-contrast sequences are unfortunately significantly limited by breath motion artifact, however there does appear to be some contrast enhancement associated with mural components. No evidence of hydronephrosis. Stomach/Bowel: Descending colonic diverticulosis. Vascular/Lymphatic: No pathologically enlarged lymph nodes identified. No abdominal aortic aneurysm demonstrated. Other:  None. Musculoskeletal: No suspicious bone lesions identified. IMPRESSION: 1. Numerous renal cysts bilaterally. As seen on prior CT examination, there is an exophytic cyst of the lateral midportion of the right kidney with a very thickened, nodular wall and thickened internal septations. Post-contrast sequences on this examination are significantly limited by breath motion artifact, however there does appear to be some contrast enhancement associated with mural components. This is highly concerning for a renal cell carcinoma, however, this lesion is not a likely source of brain mass identified by MR given the absence of lymphadenopathy or other findings of metastatic disease in the abdomen. 2. As above, no evidence of lymphadenopathy or metastatic disease in the abdomen. No evidence of osseous metastatic disease on this examination which is  not tailored for evaluation of  the osseous structures. 3. Lesion of the posterior right lobe of the liver identified by prior CT is a benign, lobulated cyst. Electronically Signed   By: Eddie Candle M.D.   On: 09/14/2020 13:44   DG Chest Portable 1 View  Result Date: 09/12/2020 CLINICAL DATA:  Lethargic EXAM: PORTABLE CHEST 1 VIEW COMPARISON:  03/06/2016 FINDINGS: The heart size and mediastinal contours are within normal limits. Both lungs are clear. The visualized skeletal structures are unremarkable. IMPRESSION: No active disease. Electronically Signed   By: Donavan Foil M.D.   On: 09/12/2020 18:43   EEG adult  Result Date: 09/13/2020 Lora Havens, MD     09/13/2020 10:01 AM Patient Name: Maleak Brazzel MRN: [id number] Epilepsy Attending: Lora Havens Referring Physician/Provider: Dr Derrick Ravel Date: 09/13/2020 Duration: 23.29 mins Patient history: y.o. 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 EEG to evaluate for seizure Level of alertness: Awake, asleep AEDs during EEG study: LEV Technical aspects: This EEG study was done with scalp electrodes positioned according to the 10-20 International system of electrode placement. Electrical activity was acquired at a sampling rate of 500Hz  and reviewed with a high frequency filter of 70Hz  and a low frequency filter of 1Hz . EEG data were recorded continuously and digitally stored. Description: The posterior dominant rhythm consists of 9 Hz activity of moderate voltage (25-35 uV) seen predominantly in posterior head regions, asymmetric ( L<R) and reactive to eye opening and eye closing. Sleep was characterized by vertex waves, sleep spindles (12 to 14 Hz), maximal frontocentral region.  EEG showed continuous 3 to 5 Hz theta-delta slowing in left hemisphere, maximal left frontal region.  Sharp waves were also noted in left frontal region. 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  Physiologic photic driving was not seen during photic stimulation.  Hyperventilation was not performed.   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  No seizures were seen throughout the recording. [place]: BNP (last 3 results) No results for input(s): BNP in the last 8760 hours. Basic Metabolic Panel: Recent Labs  Lab 09/19/20 0457 09/20/20 0143 09/21/20 0218  NA 139 140 136  K 3.6 4.1 4.1  CL 106 105 104  CO2 22 25 23   GLUCOSE 152* 162* 156*  BUN 22 22 22   CREATININE 1.01 1.20 1.10  CALCIUM 8.0* 8.4* 8.5*   Liver Function Tests: No results for input(s): AST, ALT, ALKPHOS, BILITOT, PROT, ALBUMIN in the last 168 hours. No results for input(s): LIPASE, AMYLASE in the last 168 hours. No results for input(s): AMMONIA in the last 168 hours. CBC: Recent Labs  Lab 09/19/20 0457 09/20/20 0143 09/21/20 0218  WBC 20.5* 17.1* 16.2*  HGB 14.0 15.1 14.4  HCT 40.8 43.9 42.3  MCV 88.5 89.4 88.7  PLT 236 223 214   Cardiac Enzymes: No results for input(s): CKTOTAL, CKMB, CKMBINDEX, TROPONINI in the last 168 hours. BNP: Invalid input(s): POCBNP CBG: Recent Labs  Lab 09/22/20 0615 09/22/20 1131 09/22/20 1706 09/22/20 2132 09/23/20 0602  GLUCAP 143* 164* 114* 164* 124*   D-Dimer No results for input(s): DDIMER in the last 72 hours. Hgb A1c No results for input(s): HGBA1C in  the last 72 hours. Lipid Profile No results for input(s): CHOL, HDL, LDLCALC, TRIG, CHOLHDL, LDLDIRECT in the last 72 hours. Thyroid function studies No results for input(s): TSH, T4TOTAL, T3FREE, THYROIDAB in the last 72 hours.  Invalid input(s): FREET3 Anemia work up No results for input(s): VITAMINB12, FOLATE, FERRITIN,  TIBC, IRON, RETICCTPCT in the last 72 hours. Urinalysis    Component Value Date/Time   COLORURINE YELLOW 09/12/2020 2104   APPEARANCEUR HAZY (A) 09/12/2020 2104   LABSPEC 1.015 09/12/2020 2104   PHURINE 5.0 09/12/2020 2104   GLUCOSEU NEGATIVE 09/12/2020 2104   HGBUR NEGATIVE 09/12/2020 2104   BILIRUBINUR NEGATIVE 09/12/2020 2104   KETONESUR 5 (A) 09/12/2020 2104   PROTEINUR NEGATIVE 09/12/2020 2104   NITRITE NEGATIVE 09/12/2020 2104   LEUKOCYTESUR NEGATIVE 09/12/2020 2104   Sepsis Labs Invalid input(s): PROCALCITONIN,  WBC,  LACTICIDVEN Microbiology Recent Results (from the past 240 hour(s))  MRSA PCR Screening     Status: None   Collection Time: 09/15/20  9:27 AM   Specimen: Nasopharyngeal  Result Value Ref Range Status   MRSA by PCR NEGATIVE NEGATIVE Final    Comment:        The GeneXpert MRSA Assay (FDA approved for NASAL specimens only), is one component of a comprehensive MRSA colonization surveillance program. It is not intended to diagnose MRSA infection nor to guide or monitor treatment for MRSA infections. Performed at [facility], [place] [address] [place], [place] [address]      Time coordinating discharge: 25  minutes  SIGNED: Antonieta Pert, MD  [facility] 09/23/2020, 11:16 AM  If 7PM-7AM, please contact night-coverage [email or web address]

## 2020-09-24 DIAGNOSIS — G8929 Other chronic pain: Secondary | ICD-10-CM | POA: Diagnosis not present

## 2020-09-24 DIAGNOSIS — Z483 Aftercare following surgery for neoplasm: Secondary | ICD-10-CM | POA: Diagnosis not present

## 2020-09-24 DIAGNOSIS — Z95818 Presence of other cardiac implants and grafts: Secondary | ICD-10-CM | POA: Diagnosis not present

## 2020-09-24 DIAGNOSIS — N179 Acute kidney failure, unspecified: Secondary | ICD-10-CM | POA: Diagnosis not present

## 2020-09-24 DIAGNOSIS — Q211 Atrial septal defect: Secondary | ICD-10-CM | POA: Diagnosis not present

## 2020-09-24 DIAGNOSIS — Z7952 Long term (current) use of systemic steroids: Secondary | ICD-10-CM | POA: Diagnosis not present

## 2020-09-24 DIAGNOSIS — Z87442 Personal history of urinary calculi: Secondary | ICD-10-CM | POA: Diagnosis not present

## 2020-09-24 DIAGNOSIS — M1990 Unspecified osteoarthritis, unspecified site: Secondary | ICD-10-CM | POA: Diagnosis not present

## 2020-09-24 DIAGNOSIS — I480 Paroxysmal atrial fibrillation: Secondary | ICD-10-CM | POA: Diagnosis not present

## 2020-09-24 DIAGNOSIS — Z9181 History of falling: Secondary | ICD-10-CM | POA: Diagnosis not present

## 2020-09-24 DIAGNOSIS — R569 Unspecified convulsions: Secondary | ICD-10-CM | POA: Diagnosis not present

## 2020-09-24 DIAGNOSIS — G4733 Obstructive sleep apnea (adult) (pediatric): Secondary | ICD-10-CM | POA: Diagnosis not present

## 2020-09-24 DIAGNOSIS — D496 Neoplasm of unspecified behavior of brain: Secondary | ICD-10-CM | POA: Diagnosis not present

## 2020-09-24 DIAGNOSIS — N2889 Other specified disorders of kidney and ureter: Secondary | ICD-10-CM | POA: Diagnosis not present

## 2020-09-24 DIAGNOSIS — I10 Essential (primary) hypertension: Secondary | ICD-10-CM | POA: Diagnosis not present

## 2020-09-24 DIAGNOSIS — M25561 Pain in right knee: Secondary | ICD-10-CM | POA: Diagnosis not present

## 2020-09-24 DIAGNOSIS — R7303 Prediabetes: Secondary | ICD-10-CM | POA: Diagnosis not present

## 2020-09-24 DIAGNOSIS — Z8673 Personal history of transient ischemic attack (TIA), and cerebral infarction without residual deficits: Secondary | ICD-10-CM | POA: Diagnosis not present

## 2020-09-25 ENCOUNTER — Telehealth: Payer: Self-pay | Admitting: *Deleted

## 2020-09-25 NOTE — Telephone Encounter (Signed)
Donzetta Matters, son, called and stated,"I would like to set up a phone consult/conversation with Dr. Marin Olp about my dad. Dr. Marin Olp spoke to my mom the other day and she couldn't tell me what Dr. Marin Olp said. I am on his HIPPA form. My return number is [phone]. I told him that Dr. Marin Olp was out of the office this afternoon and I would give him this message tomorrow. He verbalized understanding.

## 2020-09-26 LAB — SURGICAL PATHOLOGY

## 2020-09-27 ENCOUNTER — Other Ambulatory Visit: Payer: Self-pay | Admitting: Radiation Therapy

## 2020-09-28 DIAGNOSIS — Z483 Aftercare following surgery for neoplasm: Secondary | ICD-10-CM | POA: Diagnosis not present

## 2020-09-28 DIAGNOSIS — I10 Essential (primary) hypertension: Secondary | ICD-10-CM | POA: Diagnosis not present

## 2020-09-28 DIAGNOSIS — R569 Unspecified convulsions: Secondary | ICD-10-CM | POA: Diagnosis not present

## 2020-09-28 DIAGNOSIS — I480 Paroxysmal atrial fibrillation: Secondary | ICD-10-CM | POA: Diagnosis not present

## 2020-09-28 DIAGNOSIS — D496 Neoplasm of unspecified behavior of brain: Secondary | ICD-10-CM | POA: Diagnosis not present

## 2020-09-28 DIAGNOSIS — Q211 Atrial septal defect: Secondary | ICD-10-CM | POA: Diagnosis not present

## 2020-09-29 ENCOUNTER — Other Ambulatory Visit: Payer: Self-pay | Admitting: *Deleted

## 2020-09-29 NOTE — Patient Outreach (Signed)
[facility]) Care Management  09/29/2020  Mathew Leach [birth year] [id number]  Telephone outreach for red flag on Emmi discharge: Who to call with problems, doesn't have f/u appt, and has questions.  Talked with pt's wife, [name]. She says that [name] would be able to talk on the phone but he has a very soft voice and it is difficult for people to hear him.   Introduced myself and [facility] services and the reason for the call to follow up on the [facility] call concerns. She reports that they know who to call, he has follow up appts this coming week (Will see primary care Dr. Nyra Capes Monday, Surgeon Tues post op visit, and Thursday oncology).  She reports he does have home health PT and OT, but she is not sure if they will need that service.  She says that he is doing well, got up this morning, walking around the home without assistance, eating regular meals, routine daily activities.  Asked if she has any questions and she did not.  At this time she is not sure if they will need our services.  Advised I will send her a letter with our information and she can call myself anytime with questions or for initiation of care management services.   Eulah Pont. Myrtie Neither, MSN, [facility] Gerontological Nurse Practitioner [facility] Care Management [phone]

## 2020-10-01 ENCOUNTER — Telehealth: Payer: Self-pay | Admitting: *Deleted

## 2020-10-01 DIAGNOSIS — Q211 Atrial septal defect: Secondary | ICD-10-CM | POA: Diagnosis not present

## 2020-10-01 DIAGNOSIS — I480 Paroxysmal atrial fibrillation: Secondary | ICD-10-CM | POA: Diagnosis not present

## 2020-10-01 DIAGNOSIS — C719 Malignant neoplasm of brain, unspecified: Secondary | ICD-10-CM | POA: Diagnosis not present

## 2020-10-01 DIAGNOSIS — I10 Essential (primary) hypertension: Secondary | ICD-10-CM | POA: Diagnosis not present

## 2020-10-01 DIAGNOSIS — Z7689 Persons encountering health services in other specified circumstances: Secondary | ICD-10-CM | POA: Diagnosis not present

## 2020-10-01 DIAGNOSIS — R569 Unspecified convulsions: Secondary | ICD-10-CM | POA: Diagnosis not present

## 2020-10-01 DIAGNOSIS — Z6824 Body mass index (BMI) 24.0-24.9, adult: Secondary | ICD-10-CM | POA: Diagnosis not present

## 2020-10-01 DIAGNOSIS — D496 Neoplasm of unspecified behavior of brain: Secondary | ICD-10-CM | POA: Diagnosis not present

## 2020-10-01 DIAGNOSIS — Z483 Aftercare following surgery for neoplasm: Secondary | ICD-10-CM | POA: Diagnosis not present

## 2020-10-01 NOTE — Telephone Encounter (Signed)
Call placed to patient's wife to notify her that Dr. Marin Olp would like to see patient this afternoon.  Pt.'s wife states that pt already has a scheduled appt with Dr. Mickeal Skinner this Thursday, 10/04/20 and would like to know which Dr to see.  Dr. Marin Olp notified.  Call placed back to patient and patient's wife notified per order of Dr. Marin Olp to keep scheduled appt with Dr. Mickeal Skinner.  Pt.'s wife appreciative of assistance and has no further questions at this time.

## 2020-10-04 ENCOUNTER — Inpatient Hospital Stay: Payer: Medicare Other | Attending: Internal Medicine | Admitting: Internal Medicine

## 2020-10-04 ENCOUNTER — Other Ambulatory Visit: Payer: Self-pay

## 2020-10-04 VITALS — BP 112/64 | HR 69 | Temp 97.1°F | Resp 18 | Ht 67.99 in | Wt 161.3 lb

## 2020-10-04 DIAGNOSIS — C719 Malignant neoplasm of brain, unspecified: Secondary | ICD-10-CM

## 2020-10-04 DIAGNOSIS — C711 Malignant neoplasm of frontal lobe: Secondary | ICD-10-CM | POA: Insufficient documentation

## 2020-10-04 DIAGNOSIS — Z801 Family history of malignant neoplasm of trachea, bronchus and lung: Secondary | ICD-10-CM

## 2020-10-04 DIAGNOSIS — N2889 Other specified disorders of kidney and ureter: Secondary | ICD-10-CM | POA: Diagnosis not present

## 2020-10-04 NOTE — Progress Notes (Signed)
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 4.7 x 4.4 cm. Susceptibility is present at the margins likely reflecting chronic blood products. There is no reduced diffusion centrally. There is a second or satellite lesion posteriorly along the margin of the left lateral ventricle also demonstrating peripheral enhancement. This measures approximately 5 mm (series 11, image 143). There is T2 hyperintensity about mass with regional mass effect including partial effacement of the ventral left lateral ventricle and mild subfalcine herniation. Dural thickening or thin subdural collection (2 mm) along right cerebral convexity at the vertex (for example series 701, image 364). There is no acute infarction or intracranial hemorrhage. Chronic left thalamic infarct. No hydrocephalus or extra-axial collection. Vascular: Major vessel flow voids at the skull base are preserved. Skull and upper cervical spine: Normal marrow signal is preserved. Sinuses/Orbits: Chronic right sphenoid sinusitis. Orbits are unremarkable. Other: Sella is unremarkable.   Mastoid air cells are clear. IMPRESSION: Motion degraded study. 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 Second/satellite subcentimeter adjacent lesion. Differential considerations are metastasis and primary high-grade neoplasm. Dural thickening or thin subdural collection along the right cerebral convexity at the vertex. Electronically Signed   By: PMacy MisM.D.   On: 09/13/2020 13:16   MR ABDOMEN W WO CONTRAST  Result Date: 09/14/2020 CLINICAL DATA:  Brain lesion, possible renal mass identified on prior CT EXAM: MRI ABDOMEN WITHOUT AND WITH CONTRAST TECHNIQUE: Multiplanar multisequence MR imaging of the abdomen was performed both before and after the administration of intravenous contrast. CONTRAST:  886mGADAVIST GADOBUTROL 1 MMOL/ML IV SOLN COMPARISON:  CT abdomen pelvis, 09/13/2020 FINDINGS: Examination is generally limited by motion artifact throughout, particularly on postcontrast sequences. Lower chest: No acute findings. Hepatobiliary: No mass or other parenchymal abnormality identified. Nonenhancing lobulated cyst of the posterior right lobe of the liver (series 5, image 3) Pancreas: No mass, inflammatory changes, or other parenchymal abnormality identified. Spleen:  Within normal limits in size and appearance. Adrenals/Urinary Tract: Numerous renal cysts cysts bilaterally, particularly large exophytic cysts arising from the inferior poles of the bilateral kidneys. There is a cyst of the lateral midportion of the right kidney with a very  thickened, nodular wall and thickened internal septations (series 4, image 8). Post-contrast sequences are unfortunately significantly limited by breath motion artifact, however there does appear to be some contrast enhancement associated with mural components. No evidence of hydronephrosis. Stomach/Bowel: Descending colonic diverticulosis. Vascular/Lymphatic: No pathologically enlarged lymph nodes identified. No abdominal aortic  aneurysm demonstrated. Other:  None. Musculoskeletal: No suspicious bone lesions identified. IMPRESSION: 1. Numerous renal cysts bilaterally. As seen on prior CT examination, there is an exophytic cyst of the lateral midportion of the right kidney with a very thickened, nodular wall and thickened internal septations. Post-contrast sequences on this examination are significantly limited by breath motion artifact, however there does appear to be some contrast enhancement associated with mural components. This is highly concerning for a renal cell carcinoma, however, this lesion is not a likely source of brain mass identified by MR given the absence of lymphadenopathy or other findings of metastatic disease in the abdomen. 2. As above, no evidence of lymphadenopathy or metastatic disease in the abdomen. No evidence of osseous metastatic disease on this examination which is not tailored for evaluation of the osseous structures. 3. Lesion of the posterior right lobe of the liver identified by prior CT is a benign, lobulated cyst. Electronically Signed   By: Eddie Candle M.D.   On: 09/14/2020 13:44   DG Chest Portable 1 View  Result Date: 09/12/2020 CLINICAL DATA:  Lethargic EXAM: PORTABLE CHEST 1 VIEW COMPARISON:  03/06/2016 FINDINGS: The heart size and mediastinal contours are within normal limits. Both lungs are clear. The visualized skeletal structures are unremarkable. IMPRESSION: No active disease. Electronically Signed   By: Donavan Foil M.D.   On: 09/12/2020 18:43   EEG adult  Result Date: 09/13/2020 Lora Havens, MD     09/13/2020 10:01 AM Patient Name: Mathew Leach MRN: [id number] Epilepsy Attending: Lora Havens Referring Physician/Provider: Dr Derrick Ravel Date: 09/13/2020 Duration: 23.29 mins Patient history: y.o. 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 EEG to evaluate for seizure Level of alertness: Awake, asleep AEDs during EEG study: LEV  Technical aspects: This EEG study was done with scalp electrodes positioned according to the 10-20 International system of electrode placement. Electrical activity was acquired at a sampling rate of _0  and reviewed with a high frequency filter of _1  and a low frequency filter of _2 . EEG data were recorded continuously and digitally stored. Description: The posterior dominant rhythm consists of 9 Hz activity of moderate voltage (25-35 uV) seen predominantly in posterior head regions, asymmetric ( L<R) and reactive to eye opening and eye closing. Sleep was characterized by vertex waves, sleep spindles (12 to 14 Hz), maximal frontocentral region.  EEG showed continuous 3 to 5 Hz theta-delta slowing in left hemisphere, maximal left frontal region.  Sharp waves were also noted in left frontal region. 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 Physiologic photic driving was not seen during photic stimulation.  Hyperventilation was not performed.   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  No seizures were seen throughout the recording. Lora Havens    Pathology: SURGICAL PATHOLOGY  CASE: [id number]  PATIENT: Mathew Leach  Surgical Pathology Report   Clinical History: brain tumor (cm)    FINAL  MICROSCOPIC DIAGNOSIS:   A. BRAIN TUMOR, LEFT FRONTAL, RESECTION:  - Glioblastoma, IDH-wild-type, CNS WHO grade 4  - See comment   COMMENT:   This case was sent to [facility] for interpretation, the following diagnosis  and comment was rendered by Dr. Maisie Fus:   "GFAP is positive in tumor cells, NFP shows an infiltrative pattern,  synoptic ficin is positive in a subset of tumor cells,  the Ki-67 index  is approximately 30%, IDH1 R132H is negative, ATRX shows a retained  pattern, and p53 is strongly and diffusely positive. Reticulin is not  increased."   Integrated diagnosis: Glioblastoma, IDH-wild-type, CNS WHO grade 4  Histopathologic classification: Glioblastoma with giant cell features  CNS WHO grade: 4  Molecular information (immunohistochemistry):    IDH1 R132H: NEGATIVE    ATRX: Retained pattern    P53: POSITIVE (diffuse)   These findings were discussed with Dr. Garald Braver assistant, Rushie Chestnut, on September 26, 2020.    Assessment/Plan Glioblastoma, IDH-wildtype ([place]) [C71.9]  We appreciate the opportunity to participate in the care of Mathew Leach.  He is clinically in good condition following surgery, but does have notable comorbidity (cardiac, respiratory).    We had an extensive conversation with him and his wife regarding pathology, prognosis, and available treatment pathways.    We ultimately recommended proceeding with abbreviated course of intensity modulated radiation therapy and concurrent daily Temozolomide, 3 weeks instead of full 6 weeks.  Radiation will be administered Mon-Fri over 6 weeks, Temodar will be dosed at 33m/m2 to be given daily over 21 days.  We reviewed side effects of temodar, including fatigue, nausea/vomiting, constipation, and cytopenias.  Chemotherapy should be held for the following:  ANC less than 1,000  Platelets less than 100,000  LFT or creatinine greater than 2x ULN  If clinical concerns/contraindications develop  Every 2 weeks during radiation, labs will be checked accompanied by a clinical evaluation in the brain tumor clinic.  Decadron should decrease to 430mdaily x5 days, then 50m66maily x5 days, then stop.   Keppra should maintain at 1000m550mD for now.  Concurrent potential diagnosis of renal cell carcinoma without lymphadenopathy is also noted and will be followed by Dr.  EnneMarin Olpcreening for potential clinical trials was performed and discussed using eligibility criteria for active protocols [facility], as well as national database available on [email or web address] The patient is not a candidate for a research protocol at this time due to no suitable study identified.   We spent twenty additional minutes teaching regarding the natural history, biology, and historical experience in the treatment of brain tumors. We then discussed in detail the current recommendations for therapy focusing on the mode of administration, mechanism of action, anticipated toxicities, and quality of life issues associated with this plan. We also provided teaching sheets for the patient to take home as an additional resource.  All questions were answered. The patient knows to call the clinic with any problems, questions or concerns. No barriers to learning were detected.  The total time spent in the encounter was 60 minutes and more than 50% was on counseling and review of test results   ZachVentura Sellers Medical Director of Neuro-Oncology [facility] LLCWeslFreeland18/21 3:51 PM

## 2020-10-05 DIAGNOSIS — Q211 Atrial septal defect: Secondary | ICD-10-CM | POA: Diagnosis not present

## 2020-10-05 DIAGNOSIS — D496 Neoplasm of unspecified behavior of brain: Secondary | ICD-10-CM | POA: Diagnosis not present

## 2020-10-05 DIAGNOSIS — I480 Paroxysmal atrial fibrillation: Secondary | ICD-10-CM | POA: Diagnosis not present

## 2020-10-05 DIAGNOSIS — Z483 Aftercare following surgery for neoplasm: Secondary | ICD-10-CM | POA: Diagnosis not present

## 2020-10-05 DIAGNOSIS — R569 Unspecified convulsions: Secondary | ICD-10-CM | POA: Diagnosis not present

## 2020-10-05 DIAGNOSIS — I10 Essential (primary) hypertension: Secondary | ICD-10-CM | POA: Diagnosis not present

## 2020-10-08 DIAGNOSIS — I10 Essential (primary) hypertension: Secondary | ICD-10-CM | POA: Diagnosis not present

## 2020-10-08 DIAGNOSIS — R569 Unspecified convulsions: Secondary | ICD-10-CM | POA: Diagnosis not present

## 2020-10-08 DIAGNOSIS — I480 Paroxysmal atrial fibrillation: Secondary | ICD-10-CM | POA: Diagnosis not present

## 2020-10-08 DIAGNOSIS — Z483 Aftercare following surgery for neoplasm: Secondary | ICD-10-CM | POA: Diagnosis not present

## 2020-10-08 DIAGNOSIS — D496 Neoplasm of unspecified behavior of brain: Secondary | ICD-10-CM | POA: Diagnosis not present

## 2020-10-08 DIAGNOSIS — Q211 Atrial septal defect: Secondary | ICD-10-CM | POA: Diagnosis not present

## 2020-10-08 NOTE — Progress Notes (Signed)
Location/Histology of Brain Tumor: Left Frontal Glioblastoma  Patient presented with symptoms of generalized seizure.  MRI Brain 09/19/2020: Postoperative changes from interval left frontal craniotomy for resection of left frontal lobe mass. No appreciable residual tumor on this immediate postoperative scan. Persistent but improved surrounding T2/FLAIR signal abnormality within the adjacent left frontal lobe. Postoperative extra-axial collection with pneumocephalus subjacent to the craniotomy site as above. Persistent 5 mm left-to-right midline shift.  MRI Abdomen 09/14/2020: Numerous renal cysts bilaterally. There is an exophytic cyst of the lateral midportion of the right kidney with a very thickened, nodular wall and thickened internal septations. There does appear to be some contrast enhancement associated with mural components. This is highly concerning for a renal cell carcinoma, however, this lesion is not a likely source of brain mass identified. no evidence of lymphadenopathy or metastatic disease in the abdomen. No evidence of osseous metastatic disease on this examination which is not tailored for evaluation of the osseous structures.  CT CAP 09/13/2020: Findings concerning for renal neoplasm arising from the interpolar aspect of the right kidney. Further evaluation with renal mass protocol MRI recommended.  Indeterminate 2 cm hypodense lesion in the inferior aspect of the right lobe of the liver.  Several small osseous lucent lesions concerning for metastatic disease.  Faint small ground-glass nodule versus atelectasis in the right apex.   MRI Brain 09/13/2020: Large left frontal necrotic lesion with mild regional mass effect.  Second/satellite subcentimeter adjacent lesion. Differential considerations are metastasis and primary high-grade neoplasm  CT Head 09/12/2020: Approximately 4 cm left frontal lesion with surrounding edema and mild mass effect including trace rightward midline shift.  No hydrocephalus.   Past or anticipated interventions, if any, per neurosurgery:  Dr. Kathyrn Sheriff -Left frontal mass resection 09/18/2020   Past or anticipated interventions, if any, per medical oncology:  Dr. Mickeal Skinner 10/04/2020 -We ultimately recommended proceeding with abbreviated course of intensity modulated radiation therapy and concurrent daily Temozolomide, 3 weeks instead of full 6 weeks.  Radiation will be administered Mon-Fri over 6 weeks, Temodar will be dosed at 75mg /m2 to be given daily over 21 days.  -Concurrent potential diagnosis of renal cell carcinoma without lymphadenopathy is also noted and will be followed by Dr. Marin Olp.  Follow-up with Dr. Marin Olp  Dose of Decadron, if applicable: 4 mg BID has been tapered to 2 mg daily starting today.  Recent neurologic symptoms, if any:   Seizures: No  Headaches: No  Nausea: No  Dizziness/ataxia: No  Difficulty with hand coordination: No  Focal numbness/weakness: home health PT states his right side is weaker than the left, showing improvement.  Visual deficits/changes: No  Confusion/Memory deficits: Mild confusion and memory loss.    SAFETY ISSUES:  Prior radiation? No  Pacemaker/ICD? No  Possible current pregnancy? n/a  Is the patient on methotrexate? No  Additional Complaints / other details:

## 2020-10-09 ENCOUNTER — Ambulatory Visit
Admission: RE | Admit: 2020-10-09 | Discharge: 2020-10-09 | Disposition: A | Discharge status: Home / Self Care | Payer: Medicare Other | Source: Ambulatory Visit | Attending: Radiation Oncology | Admitting: Radiation Oncology

## 2020-10-09 ENCOUNTER — Encounter: Payer: Self-pay | Admitting: Radiation Oncology

## 2020-10-09 ENCOUNTER — Other Ambulatory Visit: Payer: Self-pay

## 2020-10-09 VITALS — BP 116/66 | HR 58 | Temp 97.0°F | Resp 18 | Ht 67.75 in | Wt 161.0 lb

## 2020-10-09 DIAGNOSIS — Z79899 Other long term (current) drug therapy: Secondary | ICD-10-CM | POA: Diagnosis not present

## 2020-10-09 DIAGNOSIS — Q211 Atrial septal defect: Secondary | ICD-10-CM | POA: Diagnosis not present

## 2020-10-09 DIAGNOSIS — Z7901 Long term (current) use of anticoagulants: Secondary | ICD-10-CM | POA: Diagnosis not present

## 2020-10-09 DIAGNOSIS — Z801 Family history of malignant neoplasm of trachea, bronchus and lung: Secondary | ICD-10-CM | POA: Insufficient documentation

## 2020-10-09 DIAGNOSIS — M8580 Other specified disorders of bone density and structure, unspecified site: Secondary | ICD-10-CM | POA: Diagnosis not present

## 2020-10-09 DIAGNOSIS — G4730 Sleep apnea, unspecified: Secondary | ICD-10-CM | POA: Diagnosis not present

## 2020-10-09 DIAGNOSIS — Z86018 Personal history of other benign neoplasm: Secondary | ICD-10-CM | POA: Insufficient documentation

## 2020-10-09 DIAGNOSIS — C711 Malignant neoplasm of frontal lobe: Secondary | ICD-10-CM

## 2020-10-09 DIAGNOSIS — K449 Diaphragmatic hernia without obstruction or gangrene: Secondary | ICD-10-CM | POA: Insufficient documentation

## 2020-10-09 DIAGNOSIS — K5730 Diverticulosis of large intestine without perforation or abscess without bleeding: Secondary | ICD-10-CM | POA: Diagnosis not present

## 2020-10-09 DIAGNOSIS — I10 Essential (primary) hypertension: Secondary | ICD-10-CM | POA: Insufficient documentation

## 2020-10-09 DIAGNOSIS — I4892 Unspecified atrial flutter: Secondary | ICD-10-CM | POA: Diagnosis not present

## 2020-10-09 DIAGNOSIS — Z87442 Personal history of urinary calculi: Secondary | ICD-10-CM | POA: Diagnosis not present

## 2020-10-09 NOTE — Progress Notes (Signed)
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 Pancreas: No mass, inflammatory changes, or other parenchymal abnormality identified. Spleen:  Within normal limits in size and appearance. Adrenals/Urinary Tract: Numerous renal cysts cysts bilaterally, particularly large exophytic cysts arising from the inferior poles of the bilateral kidneys. There is a cyst of the lateral midportion of the right kidney with a very thickened, nodular wall and thickened internal septations (series 4, image 8). Post-contrast sequences are unfortunately significantly limited by breath motion artifact, however there does appear to be some contrast enhancement associated with mural components. No evidence of hydronephrosis. Stomach/Bowel: Descending colonic diverticulosis. Vascular/Lymphatic: No pathologically enlarged lymph nodes identified. No abdominal aortic aneurysm demonstrated. Other:  None. Musculoskeletal: No suspicious bone lesions identified. IMPRESSION: 1. Numerous renal cysts bilaterally. As seen on prior CT examination, there is an exophytic cyst of the lateral midportion of the right kidney with a very thickened, nodular wall and thickened internal septations. Post-contrast sequences on this examination are significantly  limited by breath motion artifact, however there does appear to be some contrast enhancement associated with mural components. This is highly concerning for a renal cell carcinoma, however, this lesion is not a likely source of brain mass identified by MR given the absence of lymphadenopathy or other findings of metastatic disease in the abdomen. 2. As above, no evidence of lymphadenopathy or metastatic disease in the abdomen. No evidence of osseous metastatic disease on this examination which is not tailored for evaluation of the osseous structures. 3. Lesion of the posterior right lobe of the liver identified by prior CT is a benign, lobulated cyst. Electronically Signed   By: Eddie Candle M.D.   On: 09/14/2020 13:44   DG Chest Portable 1 View  Result Date: 09/12/2020 CLINICAL DATA:  Lethargic EXAM: PORTABLE CHEST 1 VIEW COMPARISON:  03/06/2016 FINDINGS: The heart size and mediastinal contours are within normal limits. Both lungs are clear. The visualized skeletal structures are unremarkable. IMPRESSION: No active disease. Electronically Signed   By: Donavan Foil M.D.   On: 09/12/2020 18:43   EEG adult  Result Date: 09/13/2020 Lora Havens, MD     09/13/2020 10:01 AM Patient Name: Mathew Leach MRN: [id number] Epilepsy Attending: Lora Havens Referring Physician/Provider: Dr Derrick Ravel Date: 09/13/2020 Duration: 23.29 mins Patient history: y.o. 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 EEG to evaluate for seizure Level of alertness: Awake, asleep AEDs during EEG study: LEV Technical aspects: This EEG study was done with scalp electrodes positioned according to the 10-20 International system of electrode placement. Electrical activity was acquired at a sampling rate of 500Hz  and reviewed with a high frequency filter of 70Hz  and a low frequency filter of 1Hz . EEG data were recorded continuously and digitally stored. Description: The posterior  dominant rhythm consists of 9 Hz activity of moderate voltage (25-35 uV) seen predominantly in posterior head regions, asymmetric ( L<R) and reactive to eye opening and eye closing. Sleep was characterized by vertex waves, sleep spindles (12 to 14 Hz), maximal frontocentral region.  EEG showed continuous 3 to 5 Hz theta-delta slowing in left hemisphere, maximal left frontal region.  Sharp waves were also noted in left frontal region. 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 Physiologic photic driving was not seen during photic stimulation.  Hyperventilation was not performed.   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  No seizures were seen throughout the recording. Lora Havens       IMPRESSION/ PLAN:  The patient has been diagnosed with a left frontal GBM status post resection. Follow-up imaging looked good with no residual tumor seen. I discussed with the patient a 3-week course of radiation treatment to be given with concurrent Temodar. We discussed the rationale of treatment as well as possible side effects and risks. The patient does wish to proceed with such a treatment and will undergo simulation today for treatment planning. The patient also is being set up to see medical oncology to further work-up a possible renal carcinoma as well. The patient knows that I will see him weekly and I likely will see him next Friday after several treatments have been completed at that time.   The patient was seen in person today in clinic.  The total time spent on the patient's visit today was 40 min, including chart review, direct discussion/evaluation with the patient, and coordination of  care.         ________________________________   Jodelle Gross, MD, PhD   **Disclaimer: This note was dictated with voice recognition software. Similar sounding words can inadvertently be transcribed and this note may contain transcription errors which may not have been corrected upon publication of note.**

## 2020-10-10 DIAGNOSIS — C711 Malignant neoplasm of frontal lobe: Secondary | ICD-10-CM | POA: Diagnosis not present

## 2020-10-12 DIAGNOSIS — D496 Neoplasm of unspecified behavior of brain: Secondary | ICD-10-CM | POA: Diagnosis not present

## 2020-10-12 DIAGNOSIS — Q211 Atrial septal defect: Secondary | ICD-10-CM | POA: Diagnosis not present

## 2020-10-12 DIAGNOSIS — R569 Unspecified convulsions: Secondary | ICD-10-CM | POA: Diagnosis not present

## 2020-10-12 DIAGNOSIS — I480 Paroxysmal atrial fibrillation: Secondary | ICD-10-CM | POA: Diagnosis not present

## 2020-10-12 DIAGNOSIS — Z483 Aftercare following surgery for neoplasm: Secondary | ICD-10-CM | POA: Diagnosis not present

## 2020-10-12 DIAGNOSIS — I10 Essential (primary) hypertension: Secondary | ICD-10-CM | POA: Diagnosis not present

## 2020-10-16 ENCOUNTER — Telehealth: Payer: Self-pay | Admitting: Internal Medicine

## 2020-10-16 DIAGNOSIS — I10 Essential (primary) hypertension: Secondary | ICD-10-CM | POA: Diagnosis not present

## 2020-10-16 DIAGNOSIS — Z483 Aftercare following surgery for neoplasm: Secondary | ICD-10-CM | POA: Diagnosis not present

## 2020-10-16 DIAGNOSIS — R569 Unspecified convulsions: Secondary | ICD-10-CM | POA: Diagnosis not present

## 2020-10-16 DIAGNOSIS — D496 Neoplasm of unspecified behavior of brain: Secondary | ICD-10-CM | POA: Diagnosis not present

## 2020-10-16 DIAGNOSIS — I480 Paroxysmal atrial fibrillation: Secondary | ICD-10-CM | POA: Diagnosis not present

## 2020-10-16 DIAGNOSIS — Q211 Atrial septal defect: Secondary | ICD-10-CM | POA: Diagnosis not present

## 2020-10-16 NOTE — Telephone Encounter (Signed)
Scheduled appt per 11/30 sch msg - mailed reminder letter with appt date and time

## 2020-10-17 ENCOUNTER — Other Ambulatory Visit: Payer: Self-pay

## 2020-10-17 ENCOUNTER — Other Ambulatory Visit: Payer: Self-pay | Admitting: Internal Medicine

## 2020-10-17 ENCOUNTER — Telehealth: Payer: Self-pay

## 2020-10-17 ENCOUNTER — Telehealth: Payer: Self-pay | Admitting: Pharmacist

## 2020-10-17 ENCOUNTER — Ambulatory Visit
Admission: RE | Admit: 2020-10-17 | Discharge: 2020-10-17 | Disposition: A | Discharge status: Home / Self Care | Payer: Medicare Other | Source: Ambulatory Visit | Attending: Radiation Oncology | Admitting: Radiation Oncology

## 2020-10-17 DIAGNOSIS — C711 Malignant neoplasm of frontal lobe: Secondary | ICD-10-CM

## 2020-10-17 MED ORDER — ONDANSETRON HCL 8 MG PO TABS: 8.0000 mg | ORAL_TABLET | Freq: Two times a day (BID) | ORAL | 1 refills | Status: DC | PRN

## 2020-10-17 MED ORDER — TEMOZOLOMIDE 140 MG PO CAPS: 140.0000 mg | ORAL_CAPSULE | Freq: Every day | ORAL | 0 refills | Status: DC

## 2020-10-17 MED FILL — ONDANSETRON HCL 8 MG TABLET: 8 | 15 days supply | Qty: 30 | Fill #0

## 2020-10-17 NOTE — Telephone Encounter (Signed)
Oral Oncology Pharmacist Encounter  Received new prescription for Temodar (temozolomide) for the treatment of glioblastoma in conjunction with radiation, planned duration 21 days.  Prescription dose and frequency assessed for appropriateness. Appropriate for therapy initiation.   CBC and BMP from 09/21/20 assessed, noted WBC elevated at 16.2 K/uL, likely 2/2 to steroids.  Current medication list in Epic reviewed, no significant/relevant DDIs with Temodar identified.  Evaluated chart and no patient barriers to medication adherence noted.   Prescription has been e-scribed to the [facility] for benefits analysis and approval.  Oral Oncology Clinic will continue to follow for insurance authorization, copayment issues, initial counseling and start date.  Leron Croak, PharmD, BCPS Hematology/Oncology Clinical Pharmacist [facility] [phone] 10/17/2020 2:57 PM

## 2020-10-17 NOTE — Telephone Encounter (Signed)
Oral Chemotherapy Pharmacist Encounter  I spoke with patient's wife for overview of: Temodar for the treatment of glioblastoma multiforme in conjunction with radiation, planned duration concomitant phase 21 days of therapy.   Counseled on administration, dosing, side effects, monitoring, drug-food interactions, safe handling, storage, and disposal.  Patient will take Temodar 140mg  capsules, 140 mg total daily dose, by mouth once daily, may take at bedtime and on an empty stomach to decrease nausea and vomiting.  Patient will take Temodar concurrent with radiation for 21 days straight.  Temodar start date: 10/18/20 Radiation start date: 10/17/20  Prophylactic Zofran will not be used at initiation of concurrent phase, but will be initiated if nausea develops despite Temodar administration on an empty stomach and at bedtime.    Adverse effects include but are not limited to: nausea, vomiting, anorexia, GI upset, rash, drug fever, and fatigue. Rare but serious adverse effects of pneumocystis pneumonia and secondary malignancy also discussed.  PCP prophylaxis will not be initiated at this time, but may be added based on lymphocyte count in the future.  Reviewed importance of keeping a medication schedule and plan for any missed doses. No barriers to medication adherence identified.  Medication reconciliation performed and medication/allergy list updated.  Insurance authorization for Temodar has been obtained. Test claim at the pharmacy revealed copayment $13 for 1st fill of Temodar. Patient will pick this up after 2PM on 10/18/20 from the [facility].  All questions answered.  Ms. Favaro voiced understanding and appreciation.   Medication education handout placed in mail for patient. Family knows to call the office with questions or concerns. Oral Chemotherapy Clinic phone number provided to patient.   Leron Croak, PharmD, BCPS Hematology/Oncology Clinical  Pharmacist [facility] [phone] 10/17/2020 3:33 PM

## 2020-10-17 NOTE — Progress Notes (Signed)
START ON PATHWAY REGIMEN - Neuro     One cycle, concurrent with RT:     Temozolomide   **Always confirm dose/schedule in your pharmacy ordering system**  Patient Characteristics: Glioblastoma (Grade IV Glioma), Newly Diagnosed / Treatment Naive, Poor Performance Status and/or Elderly Patient Disease Classification: Glioma Disease Classification: Glioblastoma (Grade IV Glioma) Disease Status: Newly Diagnosed / Treatment Naive Performance Status: Poor Performance Status and/or Elderly Patient Intent of Therapy: Non-Curative / Palliative Intent, Discussed with Patient

## 2020-10-17 NOTE — Telephone Encounter (Signed)
Oral Oncology Patient Advocate Encounter  After completing a benefits investigation, prior authorization for Temodar is not required at this time through [employer].  Patient's copay is $13.     [place] Patient [place] Phone [phone] Fax [phone] 10/17/2020 2:47 PM

## 2020-10-18 ENCOUNTER — Ambulatory Visit
Admission: RE | Admit: 2020-10-18 | Discharge: 2020-10-18 | Disposition: A | Discharge status: Home / Self Care | Payer: Medicare Other | Source: Ambulatory Visit | Attending: Radiation Oncology | Admitting: Radiation Oncology

## 2020-10-18 ENCOUNTER — Other Ambulatory Visit: Payer: Self-pay

## 2020-10-18 DIAGNOSIS — C711 Malignant neoplasm of frontal lobe: Secondary | ICD-10-CM | POA: Diagnosis not present

## 2020-10-18 MED FILL — TEMOZOLOMIDE 140 MG CAPS: 140 | 25 days supply | Qty: 25 | Fill #0

## 2020-10-19 ENCOUNTER — Ambulatory Visit
Admission: RE | Admit: 2020-10-19 | Discharge: 2020-10-19 | Disposition: A | Discharge status: Home / Self Care | Payer: Medicare Other | Source: Ambulatory Visit | Attending: Radiation Oncology | Admitting: Radiation Oncology

## 2020-10-19 ENCOUNTER — Other Ambulatory Visit: Payer: Self-pay

## 2020-10-19 DIAGNOSIS — C711 Malignant neoplasm of frontal lobe: Secondary | ICD-10-CM | POA: Diagnosis not present

## 2020-10-22 ENCOUNTER — Ambulatory Visit
Admission: RE | Admit: 2020-10-22 | Discharge: 2020-10-22 | Disposition: A | Discharge status: Home / Self Care | Payer: Medicare Other | Source: Ambulatory Visit | Attending: Radiation Oncology | Admitting: Radiation Oncology

## 2020-10-22 DIAGNOSIS — C711 Malignant neoplasm of frontal lobe: Secondary | ICD-10-CM | POA: Diagnosis not present

## 2020-10-23 ENCOUNTER — Ambulatory Visit
Admission: RE | Admit: 2020-10-23 | Discharge: 2020-10-23 | Disposition: A | Discharge status: Home / Self Care | Payer: Medicare Other | Source: Ambulatory Visit | Attending: Radiation Oncology | Admitting: Radiation Oncology

## 2020-10-23 DIAGNOSIS — C711 Malignant neoplasm of frontal lobe: Secondary | ICD-10-CM | POA: Diagnosis not present

## 2020-10-24 ENCOUNTER — Ambulatory Visit
Admission: RE | Admit: 2020-10-24 | Discharge: 2020-10-24 | Disposition: A | Discharge status: Home / Self Care | Payer: Medicare Other | Source: Ambulatory Visit | Attending: Radiation Oncology | Admitting: Radiation Oncology

## 2020-10-24 ENCOUNTER — Other Ambulatory Visit: Payer: Self-pay

## 2020-10-24 DIAGNOSIS — C711 Malignant neoplasm of frontal lobe: Secondary | ICD-10-CM | POA: Diagnosis not present

## 2020-10-25 ENCOUNTER — Ambulatory Visit
Admission: RE | Admit: 2020-10-25 | Discharge: 2020-10-25 | Disposition: A | Discharge status: Home / Self Care | Payer: Medicare Other | Source: Ambulatory Visit | Attending: Radiation Oncology | Admitting: Radiation Oncology

## 2020-10-25 DIAGNOSIS — C711 Malignant neoplasm of frontal lobe: Secondary | ICD-10-CM | POA: Diagnosis not present

## 2020-10-26 ENCOUNTER — Ambulatory Visit
Admission: RE | Admit: 2020-10-26 | Discharge: 2020-10-26 | Disposition: A | Discharge status: Home / Self Care | Payer: Medicare Other | Source: Ambulatory Visit | Attending: Radiation Oncology | Admitting: Radiation Oncology

## 2020-10-26 DIAGNOSIS — C711 Malignant neoplasm of frontal lobe: Secondary | ICD-10-CM | POA: Diagnosis not present

## 2020-10-29 ENCOUNTER — Ambulatory Visit
Admission: RE | Admit: 2020-10-29 | Discharge: 2020-10-29 | Disposition: A | Discharge status: Home / Self Care | Payer: Medicare Other | Source: Ambulatory Visit | Attending: Radiation Oncology | Admitting: Radiation Oncology

## 2020-10-29 DIAGNOSIS — C711 Malignant neoplasm of frontal lobe: Secondary | ICD-10-CM | POA: Diagnosis not present

## 2020-10-30 ENCOUNTER — Ambulatory Visit
Admission: RE | Admit: 2020-10-30 | Discharge: 2020-10-30 | Disposition: A | Discharge status: Home / Self Care | Payer: Medicare Other | Source: Ambulatory Visit | Attending: Radiation Oncology | Admitting: Radiation Oncology

## 2020-10-30 DIAGNOSIS — C711 Malignant neoplasm of frontal lobe: Secondary | ICD-10-CM | POA: Diagnosis not present

## 2020-10-31 ENCOUNTER — Ambulatory Visit
Admission: RE | Admit: 2020-10-31 | Discharge: 2020-10-31 | Disposition: A | Discharge status: Home / Self Care | Payer: Medicare Other | Source: Ambulatory Visit | Attending: Radiation Oncology | Admitting: Radiation Oncology

## 2020-10-31 DIAGNOSIS — C711 Malignant neoplasm of frontal lobe: Secondary | ICD-10-CM | POA: Diagnosis not present

## 2020-11-01 ENCOUNTER — Other Ambulatory Visit: Payer: Self-pay

## 2020-11-01 ENCOUNTER — Ambulatory Visit
Admission: RE | Admit: 2020-11-01 | Discharge: 2020-11-01 | Disposition: A | Discharge status: Home / Self Care | Payer: Medicare Other | Source: Ambulatory Visit | Attending: Radiation Oncology | Admitting: Radiation Oncology

## 2020-11-01 ENCOUNTER — Inpatient Hospital Stay: Payer: Medicare Other | Attending: Internal Medicine | Admitting: Internal Medicine

## 2020-11-01 ENCOUNTER — Inpatient Hospital Stay: Payer: Medicare Other

## 2020-11-01 VITALS — BP 97/74 | HR 81 | Temp 97.1°F | Resp 18 | Ht 67.75 in | Wt 158.5 lb

## 2020-11-01 DIAGNOSIS — C711 Malignant neoplasm of frontal lobe: Secondary | ICD-10-CM

## 2020-11-01 LAB — CBC WITH DIFFERENTIAL (CANCER CENTER ONLY)
Abs Immature Granulocytes: 0.38 10*3/uL — ABNORMAL HIGH (ref 0.00–0.07)
Basophils Absolute: 0.1 10*3/uL (ref 0.0–0.1)
Basophils Relative: 2 %
Eosinophils Absolute: 0.3 10*3/uL (ref 0.0–0.5)
Eosinophils Relative: 4 %
HCT: 42.2 % (ref 39.0–52.0)
Hemoglobin: 13.5 g/dL (ref 13.0–17.0)
Immature Granulocytes: 5 %
Lymphocytes Relative: 12 %
Lymphs Abs: 1 10*3/uL (ref 0.7–4.0)
MCH: 29.5 pg (ref 26.0–34.0)
MCHC: 32 g/dL (ref 30.0–36.0)
MCV: 92.3 fL (ref 80.0–100.0)
Monocytes Absolute: 0.7 10*3/uL (ref 0.1–1.0)
Monocytes Relative: 9 %
Neutro Abs: 5.3 10*3/uL (ref 1.7–7.7)
Neutrophils Relative %: 68 %
Platelet Count: 488 10*3/uL — ABNORMAL HIGH (ref 150–400)
RBC: 4.57 MIL/uL (ref 4.22–5.81)
RDW: 13.4 % (ref 11.5–15.5)
WBC Count: 7.8 10*3/uL (ref 4.0–10.5)
nRBC: 0 % (ref 0.0–0.2)

## 2020-11-01 LAB — CMP (CANCER CENTER ONLY)
ALT: 28 U/L (ref 0–44)
AST: 29 U/L (ref 15–41)
Albumin: 2.8 g/dL — ABNORMAL LOW (ref 3.5–5.0)
Alkaline Phosphatase: 117 U/L (ref 38–126)
Anion gap: 10 (ref 5–15)
BUN: 8 mg/dL (ref 8–23)
CO2: 26 mmol/L (ref 22–32)
Calcium: 9.4 mg/dL (ref 8.9–10.3)
Chloride: 107 mmol/L (ref 98–111)
Creatinine: 1.14 mg/dL (ref 0.61–1.24)
GFR, Estimated: >60 mL/min (ref >60)
Glucose, Bld: 114 mg/dL — ABNORMAL HIGH (ref 70–99)
Potassium: 4.1 mmol/L (ref 3.5–5.1)
Sodium: 143 mmol/L (ref 135–145)
Total Bilirubin: 0.5 mg/dL (ref 0.3–1.2)
Total Protein: 6.7 g/dL (ref 6.5–8.1)

## 2020-11-01 NOTE — Progress Notes (Signed)
[facility] at [place], [place] [address] [phone]   Interval Evaluation  Date of Service: 11/01/20 Patient Name: Mathew Leach Patient MRN: [id number] Patient DOB: 1950/12/25 Provider: Ventura Sellers, MD  Identifying Statement:  Mathew Leach is a 69 y.o. male with left frontal glioblastoma   Referring Provider:  Oncologic History: Oncology History  Frontal glioblastoma multiforme ([place])  09/18/2020 Surgery   Left frontal craniotomy, resection by Dr. Kathyrn Sheriff; path demonstrates Glioblastaoma IDH-wt   10/17/2020 -  Chemotherapy   The patient had dexamethasone (DECADRON) 4 MG tablet, 0 of 1 cycle, Start date: --, End date: -- temozolomide (TEMODAR) 5 MG capsule, , Oral, Daily, 0 of 1 cycle, Start date: --, End date: -- temozolomide (TEMODAR) 20 MG capsule, , Oral, Daily, 0 of 1 cycle, Start date: --, End date: -- temozolomide (TEMODAR) 100 MG capsule, , Oral, Daily, 0 of 1 cycle, Start date: --, End date: -- temozolomide (TEMODAR) 140 MG capsule, 140 mg (100 % of original dose 140 mg), Oral, Daily, 0 of 1 cycle, Start date: 10/17/2020, End date: -- Dose modification: 140 mg (original dose 140 mg, Cycle 1)  for chemotherapy treatment.      Biomarkers:  MGMT Unknown.  IDH 1/2 Wild type.  EGFR Unknown  TERT Unknown   Interval History: Mathew Leach presents to clinic today, now having completed 2 weeks of radiation and concurrent Temodar.  He describes good tolerance of treatment thus far, without disabling or significant complication.  Wife does describe him behaving more "withdrawn", "less engaged" and "not energetic".  He is agreeable to this sentiment, though without complaint himself.  Otherwise remains active and independent.  Denies further seizures, no headaches.  H+P (10/04/20)  [name] presented to medical attention three weeks ago with new onset generalized seizure.  This occurred at  work, unwitnessed, and then second event was witnessed by EMS described as generalized shaking.  CNS imaging demonstrated left frontal mass lesion, which was resected by Dr. Kathyrn Sheriff on 09/18/20.  Following surgery, he had no clinical complaints.  Workup did also demonstrate mass within kidney possibly c/w renal cell carcinoma.  He presents today for path review; he is fully independent for age, lives with his wife.   Medications: Current Outpatient Medications on File Prior to Visit  Medication Sig Dispense Refill  . amLODipine (NORVASC) 10 MG tablet Take 1 tablet (10 mg total) by mouth daily. 30 tablet 0  . levETIRAcetam (KEPPRA) 1000 MG tablet Take 1 tablet (1,000 mg total) by mouth 2 (two) times daily. 60 tablet 0  . pantoprazole (PROTONIX) 40 MG tablet Take 1 tablet (40 mg total) by mouth at bedtime. 30 tablet 0  . simvastatin (ZOCOR) 20 MG tablet Take 1 tablet (20 mg total) by mouth daily. (Patient taking differently: Take 20 mg by mouth daily with lunch.) 90 tablet 3  . temozolomide (TEMODAR) 140 MG capsule Take 1 capsule (140 mg total) by mouth daily. May take on an empty stomach to decrease nausea & vomiting. Take as directed by MD. 25 capsule 0  . Cholecalciferol (VITAMIN D3) 50 MCG (2000 UT) TABS Take 2,000 Units by mouth daily with breakfast. (Patient not taking: No sig reported)    . folic acid (FOLVITE) 694 MCG tablet Take 800 mcg by mouth daily. (Patient not taking: No sig reported)    . Omega-3 Fatty Acids (FISH OIL) 1000 MG CAPS Take 1,000 mg by mouth daily.  (Patient  not taking: No sig reported)    . ondansetron (ZOFRAN) 8 MG tablet Take 1 tablet (8 mg total) by mouth 2 (two) times daily as needed (nausea and vomiting). May take 30-60 minutes prior to Temodar administration if nausea/vomiting occurs. (Patient not taking: Reported on 11/01/2020) 30 tablet 1  . PRESCRIPTION MEDICATION See admin instructions. CPAP- At bedtime (Patient not taking: Reported on 11/01/2020)     No current  facility-administered medications on file prior to visit.    Allergies:  Allergies  Allergen Reactions  . Lipitor [Atorvastatin] Other (See Comments)    Caused DOUBLE VISION    Past Medical History:  Past Medical History:  Diagnosis Date  . Actinic keratosis    scalp  . Arthritis   . ASD (atrial septal defect) 06/10/2015  . Atrial flutter, paroxysmal ([place]) 08/01/2015  . Chronic anticoagulation 08/29/2015  . Chronic pain of right knee 12/07/2014  . Dysplastic nevus 01/11/2015   upper back spinal  . Dysplastic nevus 01/29/2017   right superior calf inferior to popliteal  . Dysrhythmia    AFIB  . Goals of care, counseling/discussion 09/17/2020  . Hypertension   . Kidney stones   . Obstructive sleep apnea syndrome 06/26/2015  . PAF (paroxysmal atrial fibrillation) ([place]) 07/30/2015  . Pericarditis    1999  . PFO (patent foramen ovale) 06/16/2018  . Preoperative cardiovascular examination 02/03/2016  . 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 Shortness of breath dyspnea    W/ EXERTION   . Sleep apnea    CPAP  . Stroke [facility])    04-2015   Past Surgical History:  Past Surgical History:  Procedure Laterality Date  . APPLICATION OF CRANIAL NAVIGATION Left 09/18/2020   Procedure: APPLICATION OF CRANIAL NAVIGATION;  Surgeon: Consuella Lose, MD;  Location: [facility];  Service: Neurosurgery;  Laterality: Left;  . COLONOSCOPY  04/10/2011   Moderate predominantly sigmoid diverticulosis. Small internal hemorrhoids.   . COLONOSCOPY  01/30/2020  . CRANIOTOMY Left 09/18/2020   Procedure: LEFT FRONTAL CRANIOTOMY FOR  TUMOR;  Surgeon: Consuella Lose, MD;  Location: [facility];  Service: Neurosurgery;  Laterality: Left;  left  . CYST REMOVAL NECK     AGE 48  . ESOPHAGOGASTRODUODENOSCOPY  04/10/2011   Erosive esophagitis with esophageal stricture (asymptomatic strictures since the patient not having any dysphagia). LA grade D esophagitis.  . INGUINAL HERNIA REPAIR     LEFT  AS CHILD  . TOTAL KNEE ARTHROPLASTY Right 03/17/2016   Procedure: TOTAL KNEE ARTHROPLASTY;  Surgeon: [name], MD;  Location: [facility];  Service: Orthopedics;  Laterality: Right;  . watchman procedure  07/2019   Social History:  Social History   Socioeconomic History  . Marital status: Married    Spouse name: Not on file  . Number of children: Not on file  . Years of education: Not on file  . Highest education level: Not on file  Occupational History  . Not on file  Tobacco Use  . Smoking status: Never Smoker  . Smokeless tobacco: Never Used  Vaping Use  . Vaping Use: Never used  Substance and Sexual Activity  . Alcohol use: No  . Drug use: No  . Sexual activity: Not on file  Other Topics Concern  . Not on file  Social History Narrative  . Not on file   Social Determinants of Health   Financial Resource Strain: Not on file  Food Insecurity: Not on file  Transportation Needs: Not on file  Physical  Activity: Not on file  Stress: Not on file  Social Connections: Not on file  Intimate Partner Violence: Not on file   Family History:  Family History  Problem Relation Age of Onset  . Lung cancer Father   . Colon cancer Neg Hx   . Esophageal cancer Neg Hx   . Colon polyps Neg Hx   . Rectal cancer Neg Hx   . Stomach cancer Neg Hx     Review of Systems: Constitutional: Doesn't report fevers, chills or abnormal weight loss Eyes: Doesn't report blurriness of vision Ears, nose, mouth, throat, and face: Doesn't report sore throat Respiratory: Doesn't report cough, dyspnea or wheezes Cardiovascular: Doesn't report palpitation, chest discomfort  Gastrointestinal:  Doesn't report nausea, constipation, diarrhea GU: Doesn't report incontinence Skin: Doesn't report skin rashes Neurological: Per HPI Musculoskeletal: Doesn't report joint pain Behavioral/Psych: Doesn't report anxiety  Physical Exam: Vitals:   11/01/20 0954  BP: 97/74  Pulse: 81  Resp: 18  Temp: (!) 97.1  F (36.2 C)  SpO2: 98%   KPS: 80. General: Alert, cooperative, pleasant, in no acute distress Head: Normal EENT: No conjunctival injection or scleral icterus.  Lungs: Resp effort normal Cardiac: Regular rate Abdomen: Non-distended abdomen Skin: No rashes cyanosis or petechiae. Extremities: No clubbing or edema  Neurologic Exam: Mental Status: Awake, alert, attentive to examiner. Oriented to self and environment. Language is fluent with intact comprehension.  Cranial Nerves: Visual acuity is grossly normal. Visual fields are full. Extra-ocular movements intact. No ptosis. Face is symmetric Motor: Tone and bulk are normal. Power is full in both arms and legs. Reflexes are symmetric, no pathologic reflexes present.  Sensory: Intact to light touch Gait: Normal.   Labs: I have reviewed the data as listed    Component Value Date/Time   NA 143 11/01/2020 0932   NA 143 02/13/2020 1037   K 4.1 11/01/2020 0932   CL 107 11/01/2020 0932   CO2 26 11/01/2020 0932   GLUCOSE 114 (H) 11/01/2020 0932   BUN 8 11/01/2020 0932   BUN 13 02/13/2020 1037   CREATININE 1.14 11/01/2020 0932   CALCIUM 9.4 11/01/2020 0932   PROT 6.7 11/01/2020 0932   PROT 6.7 02/13/2020 1037   ALBUMIN 2.8 (L) 11/01/2020 0932   ALBUMIN 4.3 02/13/2020 1037   AST 29 11/01/2020 0932   ALT 28 11/01/2020 0932   ALKPHOS 117 11/01/2020 0932   BILITOT 0.5 11/01/2020 0932   GFRNONAA >60 11/01/2020 4259   GFRAA DUPLICATE 56/38/7564 3329   Lab Results  Component Value Date   WBC 7.8 11/01/2020   NEUTROABS 5.3 11/01/2020   HGB 13.5 11/01/2020   HCT 42.2 11/01/2020   MCV 92.3 11/01/2020   PLT 488 (H) 11/01/2020    Assessment/Plan Frontal glioblastoma multiforme (HCC) [C71.1]   [name] is clinically stable today, now having completed 2/3 weeks of IMRT and daily TMZ.  We ultimately recommended continuing with abbreviated course of intensity modulated radiation therapy and concurrent daily Temozolomide, 3  weeks instead of full 6 weeks.  Radiation will be administered Mon-Fri over 6 weeks, Temodar will be dosed at 4m/m2 to be given daily over 21 days.    Chemotherapy should be held for the following:  ANC less than 1,000  Platelets less than 100,000  LFT or creatinine greater than 2x ULN  If clinical concerns/contraindications develop  Keppra should maintain at 1008mBID for now.  Concurrent potential diagnosis of renal cell carcinoma without lymphadenopathy is also noted and will be  followed by Dr. Marin Olp.  We ask that [name] return to clinic in 1 months following post-radiation brain MRI, or sooner as needed.  All questions were answered. The patient knows to call the clinic with any problems, questions or concerns. No barriers to learning were detected.  I have spent a total of 40 minutes of face-to-face and non-face-to-face time, excluding clinical staff time, preparing to see patient, ordering tests and/or medications, counseling the patient, and independently interpreting results and communicating results to the patient/family/caregiver    Ventura Sellers, MD Medical Director of Neuro-Oncology [facility] at [place] 11/01/20 10:25 AM

## 2020-11-02 ENCOUNTER — Ambulatory Visit
Admission: RE | Admit: 2020-11-02 | Discharge: 2020-11-02 | Disposition: A | Discharge status: Home / Self Care | Payer: Medicare Other | Source: Ambulatory Visit | Attending: Radiation Oncology | Admitting: Radiation Oncology

## 2020-11-02 DIAGNOSIS — C711 Malignant neoplasm of frontal lobe: Secondary | ICD-10-CM | POA: Diagnosis not present

## 2020-11-05 ENCOUNTER — Other Ambulatory Visit: Payer: Self-pay

## 2020-11-05 ENCOUNTER — Ambulatory Visit
Admission: RE | Admit: 2020-11-05 | Discharge: 2020-11-05 | Disposition: A | Discharge status: Home / Self Care | Payer: Medicare Other | Source: Ambulatory Visit | Attending: Radiation Oncology | Admitting: Radiation Oncology

## 2020-11-05 DIAGNOSIS — C711 Malignant neoplasm of frontal lobe: Secondary | ICD-10-CM | POA: Diagnosis not present

## 2020-11-06 ENCOUNTER — Ambulatory Visit
Admission: RE | Admit: 2020-11-06 | Discharge: 2020-11-06 | Disposition: A | Discharge status: Home / Self Care | Payer: Medicare Other | Source: Ambulatory Visit | Attending: Radiation Oncology | Admitting: Radiation Oncology

## 2020-11-06 ENCOUNTER — Encounter: Payer: Self-pay | Admitting: Radiation Oncology

## 2020-11-06 DIAGNOSIS — C711 Malignant neoplasm of frontal lobe: Secondary | ICD-10-CM | POA: Diagnosis not present

## 2020-11-19 ENCOUNTER — Other Ambulatory Visit: Payer: Self-pay | Admitting: Radiation Therapy

## 2020-11-28 ENCOUNTER — Ambulatory Visit (HOSPITAL_COMMUNITY)
Admission: RE | Admit: 2020-11-28 | Discharge: 2020-11-28 | Disposition: A | Discharge status: Home / Self Care | Payer: Medicare Other | Source: Ambulatory Visit | Attending: Internal Medicine | Admitting: Internal Medicine

## 2020-11-28 ENCOUNTER — Other Ambulatory Visit: Payer: Self-pay

## 2020-11-28 DIAGNOSIS — C711 Malignant neoplasm of frontal lobe: Secondary | ICD-10-CM | POA: Insufficient documentation

## 2020-11-28 DIAGNOSIS — G936 Cerebral edema: Secondary | ICD-10-CM | POA: Diagnosis not present

## 2020-11-28 DIAGNOSIS — C719 Malignant neoplasm of brain, unspecified: Secondary | ICD-10-CM | POA: Diagnosis not present

## 2020-11-28 DIAGNOSIS — G9389 Other specified disorders of brain: Secondary | ICD-10-CM | POA: Diagnosis not present

## 2020-11-28 DIAGNOSIS — I6389 Other cerebral infarction: Secondary | ICD-10-CM | POA: Diagnosis not present

## 2020-11-28 MED ORDER — GADOBUTROL 1 MMOL/ML IV SOLN
7.0000 mL | Freq: Once | INTRAVENOUS | Status: AC | PRN
  Administered 2020-11-28: 7 mL via INTRAVENOUS

## 2020-12-04 ENCOUNTER — Inpatient Hospital Stay: Payer: Medicare Other | Admitting: Internal Medicine

## 2020-12-04 ENCOUNTER — Telehealth: Payer: Self-pay | Admitting: Internal Medicine

## 2020-12-04 ENCOUNTER — Inpatient Hospital Stay: Payer: Medicare Other

## 2020-12-04 NOTE — Telephone Encounter (Signed)
Rescheduled appointments per 1/18 sch msg. Spoke to patient's wife who is aware of updated appointments date and times.

## 2020-12-06 ENCOUNTER — Telehealth: Payer: Self-pay

## 2020-12-06 ENCOUNTER — Telehealth: Payer: Self-pay | Admitting: Pharmacist

## 2020-12-06 ENCOUNTER — Other Ambulatory Visit: Payer: Self-pay | Admitting: Internal Medicine

## 2020-12-06 ENCOUNTER — Inpatient Hospital Stay: Payer: Medicare Other | Attending: Internal Medicine

## 2020-12-06 ENCOUNTER — Other Ambulatory Visit: Payer: Self-pay

## 2020-12-06 ENCOUNTER — Inpatient Hospital Stay (HOSPITAL_BASED_OUTPATIENT_CLINIC_OR_DEPARTMENT_OTHER): Payer: Medicare Other | Admitting: Internal Medicine

## 2020-12-06 VITALS — BP 124/73 | HR 97 | Temp 98.1°F | Resp 18 | Ht 67.0 in | Wt 152.4 lb

## 2020-12-06 DIAGNOSIS — R569 Unspecified convulsions: Secondary | ICD-10-CM | POA: Diagnosis not present

## 2020-12-06 DIAGNOSIS — C711 Malignant neoplasm of frontal lobe: Secondary | ICD-10-CM

## 2020-12-06 DIAGNOSIS — N289 Disorder of kidney and ureter, unspecified: Secondary | ICD-10-CM | POA: Insufficient documentation

## 2020-12-06 LAB — CBC WITH DIFFERENTIAL (CANCER CENTER ONLY)
Abs Immature Granulocytes: 0.04 10*3/uL (ref 0.00–0.07)
Basophils Absolute: 0.1 10*3/uL (ref 0.0–0.1)
Basophils Relative: 2 %
Eosinophils Absolute: 0.2 10*3/uL (ref 0.0–0.5)
Eosinophils Relative: 5 %
HCT: 48.3 % (ref 39.0–52.0)
Hemoglobin: 15.4 g/dL (ref 13.0–17.0)
Immature Granulocytes: 1 %
Lymphocytes Relative: 25 %
Lymphs Abs: 1.1 10*3/uL (ref 0.7–4.0)
MCH: 29.2 pg (ref 26.0–34.0)
MCHC: 31.9 g/dL (ref 30.0–36.0)
MCV: 91.7 fL (ref 80.0–100.0)
Monocytes Absolute: 0.4 10*3/uL (ref 0.1–1.0)
Monocytes Relative: 9 %
Neutro Abs: 2.7 10*3/uL (ref 1.7–7.7)
Neutrophils Relative %: 58 %
Platelet Count: 202 10*3/uL (ref 150–400)
RBC: 5.27 MIL/uL (ref 4.22–5.81)
RDW: 13.9 % (ref 11.5–15.5)
WBC Count: 4.6 10*3/uL (ref 4.0–10.5)
nRBC: 0 % (ref 0.0–0.2)

## 2020-12-06 LAB — CMP (CANCER CENTER ONLY)
ALT: 17 U/L (ref 0–44)
AST: 22 U/L (ref 15–41)
Albumin: 3.8 g/dL (ref 3.5–5.0)
Alkaline Phosphatase: 93 U/L (ref 38–126)
Anion gap: 10 (ref 5–15)
BUN: 11 mg/dL (ref 8–23)
CO2: 25 mmol/L (ref 22–32)
Calcium: 9.6 mg/dL (ref 8.9–10.3)
Chloride: 109 mmol/L (ref 98–111)
Creatinine: 1.18 mg/dL (ref 0.61–1.24)
GFR, Estimated: >60 mL/min (ref >60)
Glucose, Bld: 134 mg/dL — ABNORMAL HIGH (ref 70–99)
Potassium: 3.8 mmol/L (ref 3.5–5.1)
Sodium: 144 mmol/L (ref 135–145)
Total Bilirubin: 0.7 mg/dL (ref 0.3–1.2)
Total Protein: 7.6 g/dL (ref 6.5–8.1)

## 2020-12-06 MED ORDER — ONDANSETRON HCL 8 MG PO TABS: 8.0000 mg | ORAL_TABLET | Freq: Two times a day (BID) | ORAL | 1 refills | Status: DC | PRN

## 2020-12-06 MED ORDER — TEMOZOLOMIDE 140 MG PO CAPS: 150.0000 mg/m2/d | ORAL_CAPSULE | Freq: Every day | ORAL | 0 refills | Status: DC

## 2020-12-06 MED FILL — ONDANSETRON HCL 8 MG TABLET: 8 | 15 days supply | Qty: 30 | Fill #0

## 2020-12-06 NOTE — Telephone Encounter (Signed)
Oral Oncology Patient Advocate Encounter  After completing a benefits investigation, prior authorization for Temodar is not required at this time through [employer].  Patient's copay is $14.     [place] Patient [place] Phone [phone] Fax [phone] 12/06/2020 4:05 PM

## 2020-12-06 NOTE — Telephone Encounter (Signed)
Oral Oncology Pharmacist Encounter  Received new prescription for Temodar (temozolomide) for the maintenance treatment of glioblastoma, planned duration ~6-12 cycles.  Prescription dose and frequency assessed for appropriateness. Appropriate for therapy initiation.   CBC w/ Diff and CMP from 12/06/20 assessed, labs stable for treatment initiation.  Current medication list in Epic reviewed, no relevant/significant DDIs with Temodar identified.  Evaluated chart and no patient barriers to medication adherence noted.   Prescription has been e-scribed to the [facility] for benefits analysis and approval.  Oral Oncology Clinic will continue to follow for insurance authorization, copayment issues, initial counseling and start date.  Leron Croak, PharmD, BCPS Hematology/Oncology Clinical Pharmacist [facility] [phone] 12/06/2020 4:07 PM

## 2020-12-06 NOTE — Progress Notes (Signed)
[facility] [place], [place] [address] [phone]   Interval Evaluation  Date of Service: 12/06/20 Patient Name: Mathew Leach Patient MRN: [id number] Patient DOB: April 23, 1951 Provider: Ventura Sellers, MD  Identifying Statement:  Mathew Leach is a 70 y.o. male with left frontal glioblastoma   Referring Provider:  Oncologic History: Oncology History  Frontal glioblastoma multiforme ([place])  09/18/2020 Surgery   Left frontal craniotomy, resection by Dr. Kathyrn Sheriff; path demonstrates Glioblastaoma IDH-wt   10/17/2020 -  Chemotherapy   The patient had dexamethasone (DECADRON) 4 MG tablet, 0 of 1 cycle, Start date: --, End date: -- temozolomide (TEMODAR) 5 MG capsule, , Oral, Daily, 0 of 1 cycle, Start date: --, End date: -- temozolomide (TEMODAR) 20 MG capsule, , Oral, Daily, 0 of 1 cycle, Start date: --, End date: -- temozolomide (TEMODAR) 100 MG capsule, , Oral, Daily, 0 of 1 cycle, Start date: --, End date: -- temozolomide (TEMODAR) 140 MG capsule, 140 mg (100 % of original dose 140 mg), Oral, Daily, 0 of 1 cycle, Start date: 10/17/2020, End date: -- Dose modification: 140 mg (original dose 140 mg, Cycle 1)  for chemotherapy treatment.      Biomarkers:  MGMT Unknown.  IDH 1/2 Wild type.  EGFR Unknown  TERT Unknown   Interval History: [name] presents to clinic today, now having completed his three weeks of radiation and Temodar, and recent MRI.  Denies new or progressive neurologic deficits.  Wife continues to describe him behaving "withdrawn", "less engaged" and "not energetic".  Sleep volume is increased, now at least 12 hours per night. Otherwise remains active and independent.  Denies further seizures, no headaches.  H+P (10/04/20)  [name] presented to medical attention three weeks ago with new onset generalized seizure.  This occurred at work, unwitnessed, and then second event was  witnessed by EMS described as generalized shaking.  CNS imaging demonstrated left frontal mass lesion, which was resected by Dr. Kathyrn Sheriff on 09/18/20.  Following surgery, he had no clinical complaints.  Workup did also demonstrate mass within kidney possibly c/w renal cell carcinoma.  He presents today for path review; he is fully independent for age, lives with his wife.   Medications: Current Outpatient Medications on File Prior to Visit  Medication Sig Dispense Refill  . amLODipine-benazepril (LOTREL) 10-20 MG capsule Take 1 capsule by mouth daily.    . simvastatin (ZOCOR) 20 MG tablet Take 1 tablet (20 mg total) by mouth daily. (Patient taking differently: Take 20 mg by mouth daily with lunch.) 90 tablet 3  . Cholecalciferol (VITAMIN D3) 50 MCG (2000 UT) TABS Take 2,000 Units by mouth daily with breakfast. (Patient not taking: No sig reported)    . folic acid (FOLVITE) 222 MCG tablet Take 800 mcg by mouth daily. (Patient not taking: No sig reported)    . levETIRAcetam (KEPPRA) 1000 MG tablet Take 1 tablet (1,000 mg total) by mouth 2 (two) times daily. 60 tablet 0  . Omega-3 Fatty Acids (FISH OIL) 1000 MG CAPS Take 1,000 mg by mouth daily.  (Patient not taking: No sig reported)    . ondansetron (ZOFRAN) 8 MG tablet Take 1 tablet (8 mg total) by mouth 2 (two) times daily as needed (nausea and vomiting). May take 30-60 minutes prior to Temodar administration if nausea/vomiting occurs. (Patient not taking: No sig reported) 30 tablet 1  . pantoprazole (PROTONIX) 40 MG tablet Take 1 tablet (40 mg  total) by mouth at bedtime. 30 tablet 0  . PRESCRIPTION MEDICATION See admin instructions. CPAP- At bedtime (Patient not taking: Reported on 11/01/2020)    . temozolomide (TEMODAR) 140 MG capsule Take 1 capsule (140 mg total) by mouth daily. May take on an empty stomach to decrease nausea & vomiting. Take as directed by MD. (Patient not taking: Reported on 12/06/2020) 25 capsule 0   No current  facility-administered medications on file prior to visit.    Allergies:  Allergies  Allergen Reactions  . Lipitor [Atorvastatin] Other (See Comments)    Caused DOUBLE VISION    Past Medical History:  Past Medical History:  Diagnosis Date  . Actinic keratosis    scalp  . Arthritis   . ASD (atrial septal defect) 06/10/2015  . Atrial flutter, paroxysmal ([place]) 08/01/2015  . Chronic anticoagulation 08/29/2015  . Chronic pain of right knee 12/07/2014  . Dysplastic nevus 01/11/2015   upper back spinal  . Dysplastic nevus 01/29/2017   right superior calf inferior to popliteal  . Dysrhythmia    AFIB  . Goals of care, counseling/discussion 09/17/2020  . Hypertension   . Kidney stones   . Obstructive sleep apnea syndrome 06/26/2015  . PAF (paroxysmal atrial fibrillation) ([place]) 07/30/2015  . Pericarditis    1999  . PFO (patent foramen ovale) 06/16/2018  . Preoperative cardiovascular examination 02/03/2016  . 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 Shortness of breath dyspnea    W/ EXERTION   . Sleep apnea    CPAP  . Stroke [facility])    04-2015   Past Surgical History:  Past Surgical History:  Procedure Laterality Date  . APPLICATION OF CRANIAL NAVIGATION Left 09/18/2020   Procedure: APPLICATION OF CRANIAL NAVIGATION;  Surgeon: Consuella Lose, MD;  Location: [facility];  Service: Neurosurgery;  Laterality: Left;  . COLONOSCOPY  04/10/2011   Moderate predominantly sigmoid diverticulosis. Small internal hemorrhoids.   . COLONOSCOPY  01/30/2020  . CRANIOTOMY Left 09/18/2020   Procedure: LEFT FRONTAL CRANIOTOMY FOR  TUMOR;  Surgeon: Consuella Lose, MD;  Location: [facility];  Service: Neurosurgery;  Laterality: Left;  left  . CYST REMOVAL NECK     AGE 52  . ESOPHAGOGASTRODUODENOSCOPY  04/10/2011   Erosive esophagitis with esophageal stricture (asymptomatic strictures since the patient not having any dysphagia). LA grade D esophagitis.  . INGUINAL HERNIA REPAIR     LEFT  AS CHILD  . TOTAL KNEE ARTHROPLASTY Right 03/17/2016   Procedure: TOTAL KNEE ARTHROPLASTY;  Surgeon: [name], MD;  Location: [facility];  Service: Orthopedics;  Laterality: Right;  . watchman procedure  07/2019   Social History:  Social History   Socioeconomic History  . Marital status: Married    Spouse name: Not on file  . Number of children: Not on file  . Years of education: Not on file  . Highest education level: Not on file  Occupational History  . Not on file  Tobacco Use  . Smoking status: Never Smoker  . Smokeless tobacco: Never Used  Vaping Use  . Vaping Use: Never used  Substance and Sexual Activity  . Alcohol use: No  . Drug use: No  . Sexual activity: Not on file  Other Topics Concern  . Not on file  Social History Narrative  . Not on file   Social Determinants of Health   Financial Resource Strain: Not on file  Food Insecurity: Not on file  Transportation Needs: Not on file  Physical Activity: Not  on file  Stress: Not on file  Social Connections: Not on file  Intimate Partner Violence: Not on file   Family History:  Family History  Problem Relation Age of Onset  . Lung cancer Father   . Colon cancer Neg Hx   . Esophageal cancer Neg Hx   . Colon polyps Neg Hx   . Rectal cancer Neg Hx   . Stomach cancer Neg Hx     Review of Systems: Constitutional: Doesn't report fevers, chills or abnormal weight loss Eyes: Doesn't report blurriness of vision Ears, nose, mouth, throat, and face: Doesn't report sore throat Respiratory: Doesn't report cough, dyspnea or wheezes Cardiovascular: Doesn't report palpitation, chest discomfort  Gastrointestinal:  Doesn't report nausea, constipation, diarrhea GU: Doesn't report incontinence Skin: Doesn't report skin rashes Neurological: Per HPI Musculoskeletal: Doesn't report joint pain Behavioral/Psych: Doesn't report anxiety  Physical Exam: Vitals:   12/06/20 1142  BP: 124/73  Pulse: 97  Resp: 18  Temp: 98.1 F  (36.7 C)  SpO2: 99%   KPS: 80. General: Alert, cooperative, pleasant, in no acute distress Head: Normal EENT: No conjunctival injection or scleral icterus.  Lungs: Resp effort normal Cardiac: Regular rate Abdomen: Non-distended abdomen Skin: No rashes cyanosis or petechiae. Extremities: No clubbing or edema  Neurologic Exam: Mental Status: Awake, alert, attentive to examiner. Oriented to self and environment. Language is fluent with intact comprehension.  Cranial Nerves: Visual acuity is grossly normal. Visual fields are full. Extra-ocular movements intact. No ptosis. Face is symmetric Motor: Tone and bulk are normal. Power is full in both arms and legs. Reflexes are symmetric, no pathologic reflexes present.  Sensory: Intact to light touch Gait: Normal.   Labs: I have reviewed the data as listed    Component Value Date/Time   NA 143 11/01/2020 0932   NA 143 02/13/2020 1037   K 4.1 11/01/2020 0932   CL 107 11/01/2020 0932   CO2 26 11/01/2020 0932   GLUCOSE 114 (H) 11/01/2020 0932   BUN 8 11/01/2020 0932   BUN 13 02/13/2020 1037   CREATININE 1.14 11/01/2020 0932   CALCIUM 9.4 11/01/2020 0932   PROT 6.7 11/01/2020 0932   PROT 6.7 02/13/2020 1037   ALBUMIN 2.8 (L) 11/01/2020 0932   ALBUMIN 4.3 02/13/2020 1037   AST 29 11/01/2020 0932   ALT 28 11/01/2020 0932   ALKPHOS 117 11/01/2020 0932   BILITOT 0.5 11/01/2020 0932   GFRNONAA >60 11/01/2020 3329   GFRAA DUPLICATE 51/88/4166 0630   Lab Results  Component Value Date   WBC 4.6 12/06/2020   NEUTROABS 2.7 12/06/2020   HGB 15.4 12/06/2020   HCT 48.3 12/06/2020   MCV 91.7 12/06/2020   PLT 202 12/06/2020   Imaging:  [facility] Clinician Interpretation: I have personally reviewed the CNS images as listed.  My interpretation, in the context of the patient's clinical presentation, is stable disease  MR BRAIN W WO CONTRAST  Result Date: 11/28/2020 CLINICAL DATA:  Glioblastoma follow-up EXAM: MRI HEAD WITHOUT AND WITH  CONTRAST TECHNIQUE: Multiplanar, multiecho pulse sequences of the brain and surrounding structures were obtained without and with intravenous contrast. CONTRAST:  55m GADAVIST GADOBUTROL 1 MMOL/ML IV SOLN COMPARISON:  09/19/2020 FINDINGS: Brain: Postoperative changes are identified with resolution of immediate changes seen on the prior study. There is a persistent extra-axial and scalp collection along the craniotomy. The extra-axial component measures 1 cm in thickness. Hemosiderin lined left frontal resection cavity extends to the left lateral ventricle. Enhancement at the margins has significantly decreased  with no focal nodularity. Extent of surrounding T2 FLAIR hyperintensity as decreased reflecting improvement in edema. Expected evolution of left caudate/internal capsule infarction seen on the prior study. There is adjacent intrinsic T1 shortening. Mass effect has improved with decreased ventricle effacement and no significant midline shift. 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 Small chronic left thalamic and bilateral cerebellar infarcts. Chronic subarachnoid blood products are noted within the lateral ventricles, about the brainstem, and cerebellar folia. There is no acute infarction. Vascular: Major vessel flow voids at the skull base are preserved. Skull and upper cervical spine: Normal marrow signal is preserved. Sinuses/Orbits: Minor mucosal thickening.  Orbits are unremarkable. Other: Sella is unremarkable.  Mastoid air cells are clear. IMPRESSION: Evolution of postoperative changes without evidence of residual/recurrent enhancing tumor. Decreased surrounding edema and improved mass effect. Mild prominence of the ventricles, which could reflect communicating hydrocephalus, noting presence of chronic postoperative subarachnoid blood products. Expected evolution of left caudate/internal capsule infarction. Electronically Signed   By: Macy Mis M.D.   On:  11/28/2020 15:20    Assessment/Plan Frontal glioblastoma multiforme ([place]) [C71.1]   [name] is clinically and radiographically stable today, now having completed three weeks course of IMRT and daily TMZ.  We recommended initiating treatment with Temozolomide 150 mg/m2, on for five days and off for twenty three days in twenty eight day cycles. The patient will have a complete blood count performed on days 21 and 28 of each cycle, and a comprehensive metabolic panel performed on day 28 of each cycle. Labs may need to be performed more often. Zofran will prescribed for home use for nausea/vomiting.   Chemotherapy should be held for the following:  ANC less than 1,000  Platelets less than 100,000  LFT or creatinine greater than 2x ULN  If clinical concerns/contraindications develop  Keppra should maintain at 1067m BID for now.  Concurrent potential diagnosis of renal cell carcinoma without lymphadenopathy is also noted and will be followed by Dr. EMarin Olp  We ask that DGardenia Phlegmreturn to clinic in 1 month with labs for evaluation prior to cycle #2, or sooner as needed.  All questions were answered. The patient knows to call the clinic with any problems, questions or concerns. No barriers to learning were detected.  I have spent a total of 40 minutes of face-to-face and non-face-to-face time, excluding clinical staff time, preparing to see patient, ordering tests and/or medications, counseling the patient, and independently interpreting results and communicating results to the patient/family/caregiver    ZVentura Sellers MD Medical Director of Neuro-Oncology [facility]01/20/22 11:48 AM

## 2020-12-06 NOTE — Progress Notes (Signed)
DISCONTINUE ON PATHWAY REGIMEN - Neuro     One cycle, concurrent with RT:     Temozolomide   **Always confirm dose/schedule in your pharmacy ordering system**  REASON: Continuation Of Treatment PRIOR TREATMENT: [id number]: Radiation Therapy with Concurrent Temozolomide 75 mg/m2 Daily x 3 Weeks, Followed by Sequential Temozolomide TREATMENT RESPONSE: Stable Disease (SD)  START ON PATHWAY REGIMEN - Neuro     A cycle is every 28 days:     Temozolomide      Temozolomide   **Always confirm dose/schedule in your pharmacy ordering system**  Patient Characteristics: Glioblastoma (Grade IV Glioma), Newly Diagnosed / Treatment Naive, Poor Performance Status and/or Elderly Patient Disease Classification: Glioma Disease Classification: Glioblastoma (Grade IV Glioma) Disease Status: Newly Diagnosed / Treatment Naive Performance Status: Poor Performance Status and/or Elderly Patient Intent of Therapy: Non-Curative / Palliative Intent, Discussed with Patient

## 2020-12-07 ENCOUNTER — Other Ambulatory Visit: Payer: Self-pay | Admitting: Hematology & Oncology

## 2020-12-07 DIAGNOSIS — N2889 Other specified disorders of kidney and ureter: Secondary | ICD-10-CM

## 2020-12-07 MED FILL — TEMOZOLOMIDE 140 MG CAPS: 140 | 5 days supply | Qty: 10 | Fill #0

## 2020-12-07 NOTE — Telephone Encounter (Signed)
Oral Chemotherapy Pharmacist Encounter  I spoke with patient's wife for overview of: Temodar (temozolomide) for the maintenance treatment of glioblastoma multiforme, planned duration 6-12 months of treatment.  Counseled on administration, dosing, side effects, monitoring, drug-food interactions, safe handling, storage, and disposal.  Patient will take Temodar 140mg  capsules, 2 capsules (280mg  total daily dose) by mouth once daily, may take at bedtime and on an empty stomach to decrease nausea and vomiting.  If 1st cycle is well tolerated, Ms. Nicole Kindred informed that Temodar dose may be increased to 200 mg/m2 daily for 5 days on, 23 days off, repeated every 28 days for subsequent cycles   Patient will take Temodar daily for 5 days on, 23 days off, and repeated.  Temodar start date: 12/10/20   Patient will take Zofran 8mg  tablet, 1 tablet by mouth 30-60 min prior to Temodar dose to help decrease N/V.   Adverse effects include but are not limited to: nausea, vomiting, anorexia, GI upset, rash, drug fever, and fatigue. Rare but serious adverse effects of pneumocystis pneumonia and secondary malignancy also discussed.  We discussed strategies to manage constipation if they occur secondary to ondansetron dosing.  PCP prophylaxis will not be initiated at this time, but may be added based on lymphocyte count in the future.  Reviewed importance of keeping a medication schedule and plan for any missed doses. No barriers to medication adherence identified.  Medication reconciliation performed and medication/allergy list updated.  Insurance authorization for Temodar has been obtained. Test claim at the pharmacy revealed copayment $14 for 1st fill of Temodar. Patient's wife stated they will pick this up from the [facility] on 12/08/20.  Patient's wife informed the pharmacy will reach out 5-7 days prior to needing next fill of Temodar to coordinate continued medication acquisition  to prevent break in therapy.  All questions answered.  Ms. Baugher voiced understanding and appreciation.   Medication education handout placed in mail for patient. Patient's wife knows to call the office with questions or concerns. Oral Chemotherapy Clinic phone number provided.   Leron Croak, PharmD, BCPS Hematology/Oncology Clinical Pharmacist [facility] [phone] 12/07/2020 10:24 AM

## 2020-12-10 ENCOUNTER — Telehealth: Payer: Self-pay | Admitting: Radiation Oncology

## 2020-12-10 NOTE — Telephone Encounter (Signed)
  Radiation Oncology         [phone]) [phone] ________________________________  Name: Mathew Leach MRN: [id number]  Date of Service: 12/10/2020  DOB: [birth year]  Post Treatment Telephone Note  Diagnosis:   Glioblastoma of the left frontal lobe  Interval Since Last Radiation: 5 weeks   10/17/20-11/06/20: The left frontal lobe surgical cavity was treated to 40.05 Gy in 15 fractions with concurrent temodar  Narrative:  The patient was contacted today for routine follow-up. During treatment he did very well with radiotherapy and did not have significant desquamation. He has had some difficulty with being less energetic but overall feeling okay. He is planning to restart Temodar tomorrow.   Impression/Plan: 1. Glioblastoma of the left frontal lobe. The patient has been doing well since completion of radiotherapy. We discussed that we would be happy to continue to follow him as needed, but he will also continue to follow up with Dr. Mickeal Skinner in neuro oncology.     Carola Rhine, PAC

## 2020-12-13 NOTE — Progress Notes (Signed)
  Radiation Oncology         [phone]) [phone] ________________________________  Name: Mathew Leach MRN: [id number]  Date: 11/06/2020  DOB: Sep 19, 1951  End of Treatment Note  Diagnosis:   glioblastoma (WHO IV)     Indication for treatment::  palliative  Radiation treatment dates:   10/17/20 - 11/06/20  Site/dose:   The patient was treated to the target regionto a dose of 40.05 Gy in 15 fractions using a 3-field IMRT technique.   Narrative: The patient tolerated radiation treatment relatively well.     Plan: The patient has completed radiation treatment. The patient will return to radiation oncology clinic for routine followup in one month. I advised the patient to call or return sooner if they have any questions or concerns related to their recovery or treatment. ________________________________  Jodelle Gross, M.D., Ph.D.

## 2020-12-15 ENCOUNTER — Ambulatory Visit (HOSPITAL_BASED_OUTPATIENT_CLINIC_OR_DEPARTMENT_OTHER): Payer: Medicare Other

## 2020-12-22 ENCOUNTER — Ambulatory Visit (HOSPITAL_BASED_OUTPATIENT_CLINIC_OR_DEPARTMENT_OTHER)
Admission: RE | Admit: 2020-12-22 | Discharge: 2020-12-22 | Disposition: A | Discharge status: Home / Self Care | Payer: Medicare Other | Source: Ambulatory Visit | Attending: Hematology & Oncology | Admitting: Hematology & Oncology

## 2020-12-22 ENCOUNTER — Other Ambulatory Visit: Payer: Self-pay

## 2020-12-22 DIAGNOSIS — N2889 Other specified disorders of kidney and ureter: Secondary | ICD-10-CM | POA: Insufficient documentation

## 2020-12-22 DIAGNOSIS — N281 Cyst of kidney, acquired: Secondary | ICD-10-CM | POA: Diagnosis not present

## 2020-12-22 MED ORDER — GADOBUTROL 1 MMOL/ML IV SOLN
7.0000 mL | Freq: Once | INTRAVENOUS | Status: AC | PRN
  Administered 2020-12-22: 7 mL via INTRAVENOUS

## 2020-12-27 ENCOUNTER — Other Ambulatory Visit: Payer: Self-pay

## 2020-12-27 ENCOUNTER — Telehealth: Payer: Self-pay | Admitting: *Deleted

## 2020-12-27 ENCOUNTER — Encounter: Payer: Self-pay | Admitting: Hematology & Oncology

## 2020-12-27 ENCOUNTER — Inpatient Hospital Stay: Payer: Medicare Other | Attending: Internal Medicine

## 2020-12-27 ENCOUNTER — Inpatient Hospital Stay (HOSPITAL_BASED_OUTPATIENT_CLINIC_OR_DEPARTMENT_OTHER): Payer: Medicare Other | Admitting: Hematology & Oncology

## 2020-12-27 DIAGNOSIS — Z923 Personal history of irradiation: Secondary | ICD-10-CM | POA: Insufficient documentation

## 2020-12-27 DIAGNOSIS — N2889 Other specified disorders of kidney and ureter: Secondary | ICD-10-CM

## 2020-12-27 DIAGNOSIS — C711 Malignant neoplasm of frontal lobe: Secondary | ICD-10-CM | POA: Insufficient documentation

## 2020-12-27 DIAGNOSIS — Z79899 Other long term (current) drug therapy: Secondary | ICD-10-CM | POA: Insufficient documentation

## 2020-12-27 DIAGNOSIS — N281 Cyst of kidney, acquired: Secondary | ICD-10-CM | POA: Diagnosis not present

## 2020-12-27 LAB — CBC WITH DIFFERENTIAL (CANCER CENTER ONLY)
Abs Immature Granulocytes: 0.06 10*3/uL (ref 0.00–0.07)
Basophils Absolute: 0.1 10*3/uL (ref 0.0–0.1)
Basophils Relative: 1 %
Eosinophils Absolute: 0.1 10*3/uL (ref 0.0–0.5)
Eosinophils Relative: 2 %
HCT: 44.9 % (ref 39.0–52.0)
Hemoglobin: 14.7 g/dL (ref 13.0–17.0)
Immature Granulocytes: 1 %
Lymphocytes Relative: 20 %
Lymphs Abs: 1.2 10*3/uL (ref 0.7–4.0)
MCH: 29.6 pg (ref 26.0–34.0)
MCHC: 32.7 g/dL (ref 30.0–36.0)
MCV: 90.3 fL (ref 80.0–100.0)
Monocytes Absolute: 0.7 10*3/uL (ref 0.1–1.0)
Monocytes Relative: 12 %
Neutro Abs: 3.6 10*3/uL (ref 1.7–7.7)
Neutrophils Relative %: 64 %
Platelet Count: 229 10*3/uL (ref 150–400)
RBC: 4.97 MIL/uL (ref 4.22–5.81)
RDW: 13.5 % (ref 11.5–15.5)
WBC Count: 5.7 10*3/uL (ref 4.0–10.5)
nRBC: 0 % (ref 0.0–0.2)

## 2020-12-27 LAB — CMP (CANCER CENTER ONLY)
ALT: 19 U/L (ref 0–44)
AST: 22 U/L (ref 15–41)
Albumin: 4.4 g/dL (ref 3.5–5.0)
Alkaline Phosphatase: 83 U/L (ref 38–126)
Anion gap: 7 (ref 5–15)
BUN: 12 mg/dL (ref 8–23)
CO2: 29 mmol/L (ref 22–32)
Calcium: 10.1 mg/dL (ref 8.9–10.3)
Chloride: 106 mmol/L (ref 98–111)
Creatinine: 1.12 mg/dL (ref 0.61–1.24)
GFR, Estimated: >60 mL/min (ref >60)
Glucose, Bld: 120 mg/dL — ABNORMAL HIGH (ref 70–99)
Potassium: 3.8 mmol/L (ref 3.5–5.1)
Sodium: 142 mmol/L (ref 135–145)
Total Bilirubin: 0.6 mg/dL (ref 0.3–1.2)
Total Protein: 7.1 g/dL (ref 6.5–8.1)

## 2020-12-27 NOTE — Telephone Encounter (Signed)
Per los 12/27/20 called patient and gave upcoming appts - mailed calendar

## 2020-12-27 NOTE — Progress Notes (Signed)
Hematology and Oncology Follow Up Visit  [name] [id number] 1951/11/09 70 y.o. 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 is in for his first office visit.  I actually had seen him in the hospital when he presented with the glioblastoma.  He underwent resection of this.  He subsequently was followed at the [facility] by Dr. Mickeal Skinner.  He underwent radiation and chemotherapy with Temodar.  He now is on Temodar 5 days every month.  The one problem is that when he was in the hospital, there was a question of him having a renal mass.  He had a complex cyst in the right kidney.  Again, it was unclear as to whether or not this will was malignant.  We cannot get urology to resect this because of his glioblastoma.  He certainly is doing a lot better with respect to his mental status.  He is almost back to baseline.  His wife comes in with him.  He answers questions appropriately.  There still may be a little bit of memory difficulty.  We did do an MRI.  This was done on 12/22/2020.  The MRI showed that there is unchanged size of complex exophytic right interpolar renal cyst.  This measures 5.3 cm.  Again, this was concerning for a cystic renal cell carcinoma.  There is no evidence of lymphadenopathy or any metastatic disease.  He has had no hematuria.  He has had no abdominal pain.  He is eating okay.  Has had no nausea or vomiting.  Has had no bony pain.  There is been no leg swelling.  Currently, his performance status is ECOG 1.  Medications:  Current Outpatient Medications:  .  amLODipine-benazepril (LOTREL) 10-20 MG capsule, Take 1 capsule by mouth daily., Disp: , Rfl:  .  levETIRAcetam (KEPPRA) 1000 MG tablet, Take 1,000  mg by mouth 2 (two) times daily., Disp: , Rfl:  .  simvastatin (ZOCOR) 20 MG tablet, Take 1 tablet (20 mg total) by mouth daily. (Patient taking differently: Take 20 mg by mouth daily with lunch.), Disp: 90 tablet, Rfl: 3 .  temozolomide (TEMODAR) 140 MG capsule, Take 280 mg by mouth daily. May take on an empty stomach or at bedtime to decrease nausea & vomiting. 12/27/2020 Takes 2 tablets x 5 days per month., Disp: , Rfl:  .  Cholecalciferol (VITAMIN D3) 50 MCG (2000 UT) TABS, Take 2,000 Units by mouth daily with breakfast. (Patient not taking: No sig reported), Disp: , Rfl:  .  folic acid (FOLVITE) 409 MCG tablet, Take 800 mcg by mouth daily. (Patient not taking: No sig reported), Disp: , Rfl:  .  levETIRAcetam (KEPPRA) 1000 MG tablet, Take 1 tablet (1,000 mg total) by mouth 2 (two) times daily., Disp: 60 tablet, Rfl: 0 .  Omega-3 Fatty Acids (FISH OIL) 1000 MG CAPS, Take 1,000 mg by mouth daily.  (Patient not taking: No sig reported), Disp: , Rfl:  .  ondansetron (ZOFRAN) 8 MG tablet, Take 1 tablet (8 mg total) by mouth 2 (two) times daily as needed (nausea and vomiting). May take 30-60 minutes prior to Temodar administration if nausea/vomiting occurs. (Patient not taking: Reported on 12/27/2020), Disp: 30 tablet, Rfl: 1 .  pantoprazole (PROTONIX) 40 MG  tablet, Take 1 tablet (40 mg total) by mouth at bedtime., Disp: 30 tablet, Rfl: 0 .  PRESCRIPTION MEDICATION, See admin instructions. CPAP- At bedtime (Patient not taking: No sig reported), Disp: , Rfl:   Allergies:  Allergies  Allergen Reactions  . Lipitor [Atorvastatin] Other (See Comments)    Caused DOUBLE VISION     Past Medical History, Surgical history, Social history, and Family History were reviewed and updated.  Review of Systems: Review of Systems  Constitutional: Negative.   HENT:  Negative.   Eyes: Negative.   Respiratory: Negative.   Cardiovascular: Negative.   Gastrointestinal: Negative.   Endocrine: Negative.    Genitourinary: Negative.    Musculoskeletal: Negative.   Skin: Negative.   Neurological: Negative.   Hematological: Negative.   Psychiatric/Behavioral: Negative.     Physical Exam:  weight is 154 lb 12.8 oz (70.2 kg). His oral temperature is 97.7 F (36.5 C). His blood pressure is 119/75 and his pulse is 83. His respiration is 20.   Wt Readings from Last 3 Encounters:  12/27/20 154 lb 12.8 oz (70.2 kg)  12/06/20 152 lb 6.4 oz (69.1 kg)  11/01/20 158 lb 8 oz (71.9 kg)    Physical Exam Vitals reviewed.  HENT:     Head: Normocephalic and atraumatic.     Comments: Head exam shows the craniotomy scar in the left frontoparietal region.  This is well-healed.    Mouth/Throat:     Mouth: Oropharynx is clear and moist.  Eyes:     Extraocular Movements: EOM normal.     Pupils: Pupils are equal, round, and reactive to light.  Cardiovascular:     Rate and Rhythm: Normal rate and regular rhythm.     Heart sounds: Normal heart sounds.  Pulmonary:     Effort: Pulmonary effort is normal.     Breath sounds: Normal breath sounds.  Abdominal:     General: Bowel sounds are normal.     Palpations: Abdomen is soft.  Musculoskeletal:        General: No tenderness, deformity or edema. Normal range of motion.     Cervical back: Normal range of motion.  Lymphadenopathy:     Cervical: No cervical adenopathy.  Skin:    General: Skin is warm and dry.     Findings: No erythema or rash.  Neurological:     Mental Status: He is alert and oriented to person, place, and time.  Psychiatric:        Mood and Affect: Mood and affect normal.        Behavior: Behavior normal.        Thought Content: Thought content normal.        Judgment: Judgment normal.    Lab Results  Component Value Date   WBC 5.7 12/27/2020   HGB 14.7 12/27/2020   HCT 44.9 12/27/2020   MCV 90.3 12/27/2020   PLT 229 12/27/2020     Chemistry      Component Value Date/Time   NA 142 12/27/2020 1258   NA 143 02/13/2020  1037   K 3.8 12/27/2020 1258   CL 106 12/27/2020 1258   CO2 29 12/27/2020 1258   BUN 12 12/27/2020 1258   BUN 13 02/13/2020 1037   CREATININE 1.12 12/27/2020 1258      Component Value Date/Time   CALCIUM 10.1 12/27/2020 1258   ALKPHOS 83 12/27/2020 1258   AST 22 12/27/2020 1258   ALT 19 12/27/2020 1258   BILITOT 0.6 12/27/2020 1258  Impression and Plan: Mr. Schonberg is a very nice 70 year old white male.  He presented with a glioblastoma of the left frontal lobe.  This was resected.  He underwent adjuvant therapy with radiation and Temodar.  He is now on Temodar alone.  Again, it is not clear as whether or not there is a renal cell carcinoma in the right kidney.  There is no change in this cystic mass.  This still could indicate malignancy however.  I think that given the fact that he still having treatment for the glioblastoma, I probably would just follow along with another MRI.  I would do an MRI in April.  If we ever see any change in size of this lesion, I would then see if we could resect this out by urology.  I suspect he probably would need a nephrectomy.  I am just glad to see that his performance status is doing better.  For right now, we will plan for follow-up in 2 months.  He is followed incredibly closely by Dr. Mickeal Skinner.  It was certainly nice to see him again.   Volanda Napoleon, MD 2/10/20226:31 PM

## 2020-12-31 ENCOUNTER — Other Ambulatory Visit: Payer: Self-pay | Admitting: Internal Medicine

## 2021-01-01 NOTE — Telephone Encounter (Signed)
For upcoming appt to review for refill

## 2021-01-10 ENCOUNTER — Encounter: Payer: Self-pay | Admitting: Internal Medicine

## 2021-01-10 ENCOUNTER — Inpatient Hospital Stay: Payer: Medicare Other

## 2021-01-10 ENCOUNTER — Inpatient Hospital Stay (HOSPITAL_BASED_OUTPATIENT_CLINIC_OR_DEPARTMENT_OTHER): Payer: Medicare Other | Admitting: Internal Medicine

## 2021-01-10 ENCOUNTER — Other Ambulatory Visit: Payer: Self-pay | Admitting: Internal Medicine

## 2021-01-10 ENCOUNTER — Other Ambulatory Visit: Payer: Self-pay

## 2021-01-10 VITALS — BP 134/74 | HR 78 | Temp 97.0°F | Resp 18 | Ht 67.0 in | Wt 152.1 lb

## 2021-01-10 DIAGNOSIS — Z923 Personal history of irradiation: Secondary | ICD-10-CM | POA: Diagnosis not present

## 2021-01-10 DIAGNOSIS — N281 Cyst of kidney, acquired: Secondary | ICD-10-CM | POA: Diagnosis not present

## 2021-01-10 DIAGNOSIS — C711 Malignant neoplasm of frontal lobe: Secondary | ICD-10-CM | POA: Diagnosis not present

## 2021-01-10 DIAGNOSIS — R569 Unspecified convulsions: Secondary | ICD-10-CM

## 2021-01-10 DIAGNOSIS — Z79899 Other long term (current) drug therapy: Secondary | ICD-10-CM | POA: Diagnosis not present

## 2021-01-10 LAB — CBC WITH DIFFERENTIAL (CANCER CENTER ONLY)
Abs Immature Granulocytes: 0.03 10*3/uL (ref 0.00–0.07)
Basophils Absolute: 0.1 10*3/uL (ref 0.0–0.1)
Basophils Relative: 2 %
Eosinophils Absolute: 0.2 10*3/uL (ref 0.0–0.5)
Eosinophils Relative: 3 %
HCT: 44.1 % (ref 39.0–52.0)
Hemoglobin: 14.4 g/dL (ref 13.0–17.0)
Immature Granulocytes: 1 %
Lymphocytes Relative: 26 %
Lymphs Abs: 1.3 10*3/uL (ref 0.7–4.0)
MCH: 29.5 pg (ref 26.0–34.0)
MCHC: 32.7 g/dL (ref 30.0–36.0)
MCV: 90.4 fL (ref 80.0–100.0)
Monocytes Absolute: 0.5 10*3/uL (ref 0.1–1.0)
Monocytes Relative: 10 %
Neutro Abs: 2.9 10*3/uL (ref 1.7–7.7)
Neutrophils Relative %: 58 %
Platelet Count: 226 10*3/uL (ref 150–400)
RBC: 4.88 MIL/uL (ref 4.22–5.81)
RDW: 13.3 % (ref 11.5–15.5)
WBC Count: 5 10*3/uL (ref 4.0–10.5)
nRBC: 0 % (ref 0.0–0.2)

## 2021-01-10 LAB — CMP (CANCER CENTER ONLY)
ALT: 15 U/L (ref 0–44)
AST: 18 U/L (ref 15–41)
Albumin: 3.9 g/dL (ref 3.5–5.0)
Alkaline Phosphatase: 82 U/L (ref 38–126)
Anion gap: 7 (ref 5–15)
BUN: 14 mg/dL (ref 8–23)
CO2: 26 mmol/L (ref 22–32)
Calcium: 9.3 mg/dL (ref 8.9–10.3)
Chloride: 111 mmol/L (ref 98–111)
Creatinine: 1.17 mg/dL (ref 0.61–1.24)
GFR, Estimated: >60 mL/min (ref >60)
Glucose, Bld: 130 mg/dL — ABNORMAL HIGH (ref 70–99)
Potassium: 3.9 mmol/L (ref 3.5–5.1)
Sodium: 144 mmol/L (ref 135–145)
Total Bilirubin: 0.5 mg/dL (ref 0.3–1.2)
Total Protein: 7.2 g/dL (ref 6.5–8.1)

## 2021-01-10 MED ORDER — TEMOZOLOMIDE 180 MG PO CAPS: 200.0000 mg/m2/d | ORAL_CAPSULE | Freq: Every day | ORAL | 0 refills | Status: DC

## 2021-01-10 MED FILL — TEMOZOLOMIDE 180 MG CAPS: 180 | 5 days supply | Qty: 10 | Fill #0

## 2021-01-10 MED FILL — ONDANSETRON HCL 8 MG TABLET: 8 | 15 days supply | Qty: 30 | Fill #1

## 2021-01-10 NOTE — Progress Notes (Signed)
[facility] at [place], [place] [address] [phone]   Interval Evaluation  Date of Service: 01/10/21 Patient Name: Mathew Leach Patient MRN: [id number] Patient DOB: [birth year] Provider: Ventura Sellers, MD  Identifying Statement:  Mathew Leach is a 70 y.o. male with left frontal glioblastoma   Referring Provider:  Oncologic History: Oncology History  Frontal glioblastoma multiforme ([place])  09/18/2020 Surgery   Left frontal craniotomy, resection by Dr. Kathyrn Sheriff; path demonstrates Glioblastaoma IDH-wt   10/17/2020 - 10/17/2020 Chemotherapy    Patient is on Treatment Plan: BRAIN GLIOBLASTOMA POST XRT TEMOZOLOMIDE DAYS 1-5 Q28 DAYS       12/10/2020 -  Chemotherapy    Patient is on Treatment Plan: BRAIN GLIOBLASTOMA POST XRT TEMOZOLOMIDE DAYS 1-5 Q28 DAYS         Biomarkers:  MGMT Unknown.  IDH 1/2 Wild type.  EGFR Unknown  TERT Unknown   Interval History: Mathew Leach presents to clinic today, now having completed 1st cycle of 5-day adjuvant Temozolomide.  Tolerated the chemo well without significant issues.  Denies new or progressive neurologic deficits.  Continues to described minimal activity around the home.  Sleep volume remains elevated. Otherwise remains independent with gait, basic ADLs.  Recently visited with Dr. Marin Olp after MRI abdomen to assess renal mass.  Denies further seizures, no headaches.  H+P (10/04/20)  Mathew Leach presented to medical attention three weeks ago with new onset generalized seizure.  This occurred at work, unwitnessed, and then second event was witnessed by EMS described as generalized shaking.  CNS imaging demonstrated left frontal mass lesion, which was resected by Dr. Kathyrn Sheriff on 09/18/20.  Following surgery, he had no clinical complaints.  Workup did also demonstrate mass within kidney possibly c/w renal cell carcinoma.  He presents today for path review; he  is fully independent for age, lives with his wife.   Medications: Current Outpatient Medications on File Prior to Visit  Medication Sig Dispense Refill  . amLODipine-benazepril (LOTREL) 10-20 MG capsule Take 1 capsule by mouth daily.    . Cholecalciferol (VITAMIN D3) 50 MCG (2000 UT) TABS Take 2,000 Units by mouth daily with breakfast. (Patient not taking: No sig reported)    . folic acid (FOLVITE) 494 MCG tablet Take 800 mcg by mouth daily. (Patient not taking: No sig reported)    . levETIRAcetam (KEPPRA) 1000 MG tablet Take 1 tablet (1,000 mg total) by mouth 2 (two) times daily. 60 tablet 0  . levETIRAcetam (KEPPRA) 1000 MG tablet Take 1,000 mg by mouth 2 (two) times daily.    . Omega-3 Fatty Acids (FISH OIL) 1000 MG CAPS Take 1,000 mg by mouth daily.  (Patient not taking: No sig reported)    . ondansetron (ZOFRAN) 8 MG tablet Take 1 tablet (8 mg total) by mouth 2 (two) times daily as needed (nausea and vomiting). May take 30-60 minutes prior to Temodar administration if nausea/vomiting occurs. (Patient not taking: Reported on 12/27/2020) 30 tablet 1  . pantoprazole (PROTONIX) 40 MG tablet Take 1 tablet (40 mg total) by mouth at bedtime. 30 tablet 0  . PRESCRIPTION MEDICATION See admin instructions. CPAP- At bedtime (Patient not taking: No sig reported)    . simvastatin (ZOCOR) 20 MG tablet Take 1 tablet (20 mg total) by mouth daily. (Patient taking differently: Take 20 mg by mouth daily with lunch.) 90 tablet 3  . temozolomide (TEMODAR) 140 MG capsule Take 280 mg by mouth daily. May take  on an empty stomach or at bedtime to decrease nausea & vomiting. 12/27/2020 Takes 2 tablets x 5 days per month.     No current facility-administered medications on file prior to visit.    Allergies:  Allergies  Allergen Reactions  . Lipitor [Atorvastatin] Other (See Comments)    Caused DOUBLE VISION    Past Medical History:  Past Medical History:  Diagnosis Date  . Actinic keratosis    scalp  .  Arthritis   . ASD (atrial septal defect) 06/10/2015  . Atrial flutter, paroxysmal ([place]) 08/01/2015  . Chronic anticoagulation 08/29/2015  . Chronic pain of right knee 12/07/2014  . Dysplastic nevus 01/11/2015   upper back spinal  . Dysplastic nevus 01/29/2017   right superior calf inferior to popliteal  . Dysrhythmia    AFIB  . Goals of care, counseling/discussion 09/17/2020  . Hypertension   . Kidney stones   . Obstructive sleep apnea syndrome 06/26/2015  . PAF (paroxysmal atrial fibrillation) ([place]) 07/30/2015  . Pericarditis    1999  . PFO (patent foramen ovale) 06/16/2018  . Preoperative cardiovascular examination 02/03/2016  . 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 Shortness of breath dyspnea    W/ EXERTION   . Sleep apnea    CPAP  . Stroke [facility])    04-2015   Past Surgical History:  Past Surgical History:  Procedure Laterality Date  . APPLICATION OF CRANIAL NAVIGATION Left 09/18/2020   Procedure: APPLICATION OF CRANIAL NAVIGATION;  Surgeon: Consuella Lose, MD;  Location: [facility];  Service: Neurosurgery;  Laterality: Left;  . COLONOSCOPY  04/10/2011   Moderate predominantly sigmoid diverticulosis. Small internal hemorrhoids.   . COLONOSCOPY  01/30/2020  . CRANIOTOMY Left 09/18/2020   Procedure: LEFT FRONTAL CRANIOTOMY FOR  TUMOR;  Surgeon: Consuella Lose, MD;  Location: [facility];  Service: Neurosurgery;  Laterality: Left;  left  . CYST REMOVAL NECK     AGE 50  . ESOPHAGOGASTRODUODENOSCOPY  04/10/2011   Erosive esophagitis with esophageal stricture (asymptomatic strictures since the patient not having any dysphagia). LA grade D esophagitis.  . INGUINAL HERNIA REPAIR     LEFT AS CHILD  . TOTAL KNEE ARTHROPLASTY Right 03/17/2016   Procedure: TOTAL KNEE ARTHROPLASTY;  Surgeon: [name], MD;  Location: [facility];  Service: Orthopedics;  Laterality: Right;  . watchman procedure  07/2019   Social History:  Social History   Socioeconomic History  . Marital  status: Married    Spouse name: Not on file  . Number of children: Not on file  . Years of education: Not on file  . Highest education level: Not on file  Occupational History  . Not on file  Tobacco Use  . Smoking status: Never Smoker  . Smokeless tobacco: Never Used  Vaping Use  . Vaping Use: Never used  Substance and Sexual Activity  . Alcohol use: No  . Drug use: No  . Sexual activity: Not on file  Other Topics Concern  . Not on file  Social History Narrative  . Not on file   Social Determinants of Health   Financial Resource Strain: Not on file  Food Insecurity: Not on file  Transportation Needs: Not on file  Physical Activity: Not on file  Stress: Not on file  Social Connections: Not on file  Intimate Partner Violence: Not on file   Family History:  Family History  Problem Relation Age of Onset  . Lung cancer Father   . Colon cancer Neg  Hx   . Esophageal cancer Neg Hx   . Colon polyps Neg Hx   . Rectal cancer Neg Hx   . Stomach cancer Neg Hx     Review of Systems: Constitutional: Doesn't report fevers, chills or abnormal weight loss Eyes: Doesn't report blurriness of vision Ears, nose, mouth, throat, and face: Doesn't report sore throat Respiratory: Doesn't report cough, dyspnea or wheezes Cardiovascular: Doesn't report palpitation, chest discomfort  Gastrointestinal:  Doesn't report nausea, constipation, diarrhea GU: Doesn't report incontinence Skin: Doesn't report skin rashes Neurological: Per HPI Musculoskeletal: Doesn't report joint pain Behavioral/Psych: Doesn't report anxiety  Physical Exam: Vitals:   01/10/21 1200  BP: 134/74  Pulse: 78  Resp: 18  Temp: (!) 97 F (36.1 C)  SpO2: 98%   KPS: 80. General: Alert, cooperative, pleasant, in no acute distress Head: Normal EENT: No conjunctival injection or scleral icterus.  Lungs: Resp effort normal Cardiac: Regular rate Abdomen: Non-distended abdomen Skin: No rashes cyanosis or  petechiae. Extremities: No clubbing or edema  Neurologic Exam: Mental Status: Awake, alert, attentive to examiner. Oriented to self and environment. Language is fluent with intact comprehension.  Cranial Nerves: Visual acuity is grossly normal. Visual fields are full. Extra-ocular movements intact. No ptosis. Face is symmetric Motor: Tone and bulk are normal. Power is full in both arms and legs. Reflexes are symmetric, no pathologic reflexes present.  Sensory: Intact to light touch Gait: Normal.   Labs: I have reviewed the data as listed    Component Value Date/Time   NA 142 12/27/2020 1258   NA 143 02/13/2020 1037   K 3.8 12/27/2020 1258   CL 106 12/27/2020 1258   CO2 29 12/27/2020 1258   GLUCOSE 120 (H) 12/27/2020 1258   BUN 12 12/27/2020 1258   BUN 13 02/13/2020 1037   CREATININE 1.12 12/27/2020 1258   CALCIUM 10.1 12/27/2020 1258   PROT 7.1 12/27/2020 1258   PROT 6.7 02/13/2020 1037   ALBUMIN 4.4 12/27/2020 1258   ALBUMIN 4.3 02/13/2020 1037   AST 22 12/27/2020 1258   ALT 19 12/27/2020 1258   ALKPHOS 83 12/27/2020 1258   BILITOT 0.6 12/27/2020 1258   GFRNONAA >60 12/27/2020 6301   GFRAA DUPLICATE 60/08/9322 5573   Lab Results  Component Value Date   WBC 5.7 12/27/2020   NEUTROABS 3.6 12/27/2020   HGB 14.7 12/27/2020   HCT 44.9 12/27/2020   MCV 90.3 12/27/2020   PLT 229 12/27/2020    Assessment/Plan Frontal glioblastoma multiforme (HCC) [C71.1]   Mathew Leach is clinically stable today, now having completed 1st cycle of adjuvant Temozolomide.  Labs are within normal limits.  We recommended continuing treatment with cycle #2 Temozolomide, dose increased to 200 mg/m2, on for five days and off for twenty three days in twenty eight day cycles. The patient will have a complete blood count performed on days 21 and 28 of each cycle, and a comprehensive metabolic panel performed on day 28 of each cycle. Labs may need to be performed more often. Zofran will  prescribed for home use for nausea/vomiting.   Chemotherapy should be held for the following:  ANC less than 1,000  Platelets less than 100,000  LFT or creatinine greater than 2x ULN  If clinical concerns/contraindications develop  Keppra should maintain at 1020m BID for now.  Grateful that he will continue to follow with Dr. EMarin Olpfor suspected renal cell carcinoma, currently monitoring with imaging only.  We ask that DGardenia Phlegmreturn to clinic in 1  month with MRI brain for evaluation prior to cycle #3.  All questions were answered. The patient knows to call the clinic with any problems, questions or concerns. No barriers to learning were detected.  I have spent a total of 30 minutes of face-to-face and non-face-to-face time, excluding clinical staff time, preparing to see patient, ordering tests and/or medications, counseling the patient, and independently interpreting results and communicating results to the patient/family/caregiver    Ventura Sellers, MD Medical Director of Neuro-Oncology [facility] at [place] 01/10/21 12:08 PM

## 2021-02-04 ENCOUNTER — Telehealth: Payer: Self-pay

## 2021-02-04 ENCOUNTER — Other Ambulatory Visit: Payer: Self-pay | Admitting: Internal Medicine

## 2021-02-04 DIAGNOSIS — C711 Malignant neoplasm of frontal lobe: Secondary | ICD-10-CM

## 2021-02-04 NOTE — Telephone Encounter (Signed)
Spoke with pt's wife regarding scheduling pt's upcoming MRI. Phone number: [phone] provided for them to schedule pt's MRI at their convenience. Pt's wife verbalizes understanding and agrees with plan of care.

## 2021-02-07 NOTE — Telephone Encounter (Signed)
Patient scheduled 02/14/2021

## 2021-02-08 ENCOUNTER — Ambulatory Visit (HOSPITAL_COMMUNITY)
Admission: RE | Admit: 2021-02-08 | Discharge: 2021-02-08 | Disposition: A | Discharge status: Home / Self Care | Payer: Medicare Other | Source: Ambulatory Visit | Attending: Internal Medicine | Admitting: Internal Medicine

## 2021-02-08 ENCOUNTER — Other Ambulatory Visit: Payer: Self-pay

## 2021-02-08 DIAGNOSIS — C711 Malignant neoplasm of frontal lobe: Secondary | ICD-10-CM | POA: Insufficient documentation

## 2021-02-08 DIAGNOSIS — G9389 Other specified disorders of brain: Secondary | ICD-10-CM | POA: Diagnosis not present

## 2021-02-08 DIAGNOSIS — D496 Neoplasm of unspecified behavior of brain: Secondary | ICD-10-CM | POA: Diagnosis not present

## 2021-02-08 DIAGNOSIS — J3489 Other specified disorders of nose and nasal sinuses: Secondary | ICD-10-CM | POA: Diagnosis not present

## 2021-02-08 MED ORDER — GADOBUTROL 1 MMOL/ML IV SOLN
7.0000 mL | Freq: Once | INTRAVENOUS | Status: AC | PRN
  Administered 2021-02-08: 7 mL via INTRAVENOUS

## 2021-02-11 DIAGNOSIS — E782 Mixed hyperlipidemia: Secondary | ICD-10-CM | POA: Diagnosis not present

## 2021-02-11 DIAGNOSIS — R7303 Prediabetes: Secondary | ICD-10-CM | POA: Diagnosis not present

## 2021-02-13 ENCOUNTER — Other Ambulatory Visit: Payer: Self-pay | Admitting: Radiation Therapy

## 2021-02-14 ENCOUNTER — Inpatient Hospital Stay: Payer: Medicare Other | Attending: Internal Medicine

## 2021-02-14 ENCOUNTER — Other Ambulatory Visit: Payer: Self-pay

## 2021-02-14 ENCOUNTER — Other Ambulatory Visit: Payer: Self-pay | Admitting: Internal Medicine

## 2021-02-14 ENCOUNTER — Inpatient Hospital Stay (HOSPITAL_BASED_OUTPATIENT_CLINIC_OR_DEPARTMENT_OTHER): Payer: Medicare Other | Admitting: Internal Medicine

## 2021-02-14 ENCOUNTER — Other Ambulatory Visit (HOSPITAL_COMMUNITY): Payer: Self-pay

## 2021-02-14 VITALS — BP 114/63 | HR 79 | Temp 97.6°F | Resp 18 | Ht 67.0 in | Wt 152.3 lb

## 2021-02-14 DIAGNOSIS — R569 Unspecified convulsions: Secondary | ICD-10-CM

## 2021-02-14 DIAGNOSIS — C711 Malignant neoplasm of frontal lobe: Secondary | ICD-10-CM | POA: Insufficient documentation

## 2021-02-14 LAB — CBC WITH DIFFERENTIAL (CANCER CENTER ONLY)
Abs Immature Granulocytes: 0.01 10*3/uL (ref 0.00–0.07)
Basophils Absolute: 0.1 10*3/uL (ref 0.0–0.1)
Basophils Relative: 2 %
Eosinophils Absolute: 0.2 10*3/uL (ref 0.0–0.5)
Eosinophils Relative: 4 %
HCT: 45.3 % (ref 39.0–52.0)
Hemoglobin: 14.8 g/dL (ref 13.0–17.0)
Immature Granulocytes: 0 %
Lymphocytes Relative: 24 %
Lymphs Abs: 1.1 10*3/uL (ref 0.7–4.0)
MCH: 28.8 pg (ref 26.0–34.0)
MCHC: 32.7 g/dL (ref 30.0–36.0)
MCV: 88.1 fL (ref 80.0–100.0)
Monocytes Absolute: 0.4 10*3/uL (ref 0.1–1.0)
Monocytes Relative: 9 %
Neutro Abs: 2.8 10*3/uL (ref 1.7–7.7)
Neutrophils Relative %: 61 %
Platelet Count: 224 10*3/uL (ref 150–400)
RBC: 5.14 MIL/uL (ref 4.22–5.81)
RDW: 13.2 % (ref 11.5–15.5)
WBC Count: 4.5 10*3/uL (ref 4.0–10.5)
nRBC: 0 % (ref 0.0–0.2)

## 2021-02-14 LAB — CMP (CANCER CENTER ONLY)
ALT: 14 U/L (ref 0–44)
AST: 16 U/L (ref 15–41)
Albumin: 3.9 g/dL (ref 3.5–5.0)
Alkaline Phosphatase: 83 U/L (ref 38–126)
Anion gap: 11 (ref 5–15)
BUN: 15 mg/dL (ref 8–23)
CO2: 28 mmol/L (ref 22–32)
Calcium: 9.2 mg/dL (ref 8.9–10.3)
Chloride: 109 mmol/L (ref 98–111)
Creatinine: 1.35 mg/dL — ABNORMAL HIGH (ref 0.61–1.24)
GFR, Estimated: 56 mL/min — ABNORMAL LOW (ref >60)
Glucose, Bld: 152 mg/dL — ABNORMAL HIGH (ref 70–99)
Potassium: 3.9 mmol/L (ref 3.5–5.1)
Sodium: 148 mmol/L — ABNORMAL HIGH (ref 135–145)
Total Bilirubin: 0.4 mg/dL (ref 0.3–1.2)
Total Protein: 7 g/dL (ref 6.5–8.1)

## 2021-02-14 MED ORDER — TEMOZOLOMIDE 180 MG PO CAPS: 200.0000 mg/m2/d | ORAL_CAPSULE | Freq: Every day | ORAL | 0 refills | Status: DC

## 2021-02-14 MED FILL — TEMOZOLOMIDE 180 MG CAPS: 180 | 5 days supply | Qty: 10 | Fill #0

## 2021-02-14 NOTE — Progress Notes (Signed)
[facility] [place], [place] [address] [phone]   Interval Evaluation  Date of Service: 02/14/21 Patient Name: Mathew Leach Patient MRN: [id number] Patient DOB: November 30, 1950 Provider: Ventura Sellers, MD  Identifying Statement:  Mathew Leach is a 70 y.o. male with left frontal glioblastoma   Referring Provider:  Oncologic History: Oncology History  Frontal glioblastoma multiforme ([place])  09/18/2020 Surgery   Left frontal craniotomy, resection by Dr. Kathyrn Sheriff; path demonstrates Glioblastaoma IDH-wt   10/17/2020 - 10/17/2020 Chemotherapy    Patient is on Treatment Plan: BRAIN GLIOBLASTOMA POST XRT TEMOZOLOMIDE DAYS 1-5 Q28 DAYS       12/10/2020 -  Chemotherapy    Patient is on Treatment Plan: BRAIN GLIOBLASTOMA POST XRT TEMOZOLOMIDE DAYS 1-5 Q28 DAYS         Biomarkers:  MGMT Unknown.  IDH 1/2 Wild type.  EGFR Unknown  TERT Unknown   Interval History: Mathew Leach presents to clinic today, now having completed 2 cycles of 5-day adjuvant Temozolomide.  No issues tolerating the higher dose of chemo this month. Denies new or progressive neurologic deficits.  Still minimally active at home, not motivated.  Otherwise remains independent with gait, basic ADLs.  Denies further seizures, no headaches.  H+P (10/04/20)  Mathew Leach presented to medical attention three weeks ago with new onset generalized seizure.  This occurred at work, unwitnessed, and then second event was witnessed by EMS described as generalized shaking.  CNS imaging demonstrated left frontal mass lesion, which was resected by Dr. Kathyrn Sheriff on 09/18/20.  Following surgery, he had no clinical complaints.  Workup did also demonstrate mass within kidney possibly c/w renal cell carcinoma.  He presents today for path review; he is fully independent for age, lives with his wife.   Medications: Current Outpatient Medications on File Prior  to Visit  Medication Sig Dispense Refill  . amLODipine-benazepril (LOTREL) 10-20 MG capsule Take 1 capsule by mouth daily.    . Cholecalciferol (VITAMIN D3) 50 MCG (2000 UT) TABS Take 2,000 Units by mouth daily with breakfast.    . folic acid (FOLVITE) 366 MCG tablet Take 800 mcg by mouth daily.    Marland Kitchen levETIRAcetam (KEPPRA) 1000 MG tablet Take 1 tablet (1,000 mg total) by mouth 2 (two) times daily. 60 tablet 0  . levETIRAcetam (KEPPRA) 1000 MG tablet Take 1,000 mg by mouth 2 (two) times daily.    . Omega-3 Fatty Acids (FISH OIL) 1000 MG CAPS Take 1,000 mg by mouth daily.    . ondansetron (ZOFRAN) 8 MG tablet Take 1 tablet (8 mg total) by mouth 2 (two) times daily as needed (nausea and vomiting). May take 30-60 minutes prior to Temodar administration if nausea/vomiting occurs. 30 tablet 1  . pantoprazole (PROTONIX) 40 MG tablet Take 1 tablet (40 mg total) by mouth at bedtime. 30 tablet 0  . PRESCRIPTION MEDICATION See admin instructions. CPAP- At bedtime    . simvastatin (ZOCOR) 20 MG tablet Take 1 tablet (20 mg total) by mouth daily. (Patient taking differently: Take 20 mg by mouth daily with lunch.) 90 tablet 3  . temozolomide (TEMODAR) 140 MG capsule Take 280 mg by mouth daily. May take on an empty stomach or at bedtime to decrease nausea & vomiting. 12/27/2020 Takes 2 tablets x 5 days per month.     No current facility-administered medications on file prior to visit.    Allergies:  Allergies  Allergen Reactions  . Lipitor [Atorvastatin]  Other (See Comments)    Caused DOUBLE VISION    Past Medical History:  Past Medical History:  Diagnosis Date  . Actinic keratosis    scalp  . Arthritis   . ASD (atrial septal defect) 06/10/2015  . Atrial flutter, paroxysmal ([place]) 08/01/2015  . Chronic anticoagulation 08/29/2015  . Chronic pain of right knee 12/07/2014  . Dysplastic nevus 01/11/2015   upper back spinal  . Dysplastic nevus 01/29/2017   right superior calf inferior to popliteal  .  Dysrhythmia    AFIB  . Goals of care, counseling/discussion 09/17/2020  . Hypertension   . Kidney stones   . Obstructive sleep apnea syndrome 06/26/2015  . PAF (paroxysmal atrial fibrillation) ([place]) 07/30/2015  . Pericarditis    1999  . PFO (patent foramen ovale) 06/16/2018  . Preoperative cardiovascular examination 02/03/2016  . 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 Shortness of breath dyspnea    W/ EXERTION   . Sleep apnea    CPAP  . Stroke [facility])    04-2015   Past Surgical History:  Past Surgical History:  Procedure Laterality Date  . APPLICATION OF CRANIAL NAVIGATION Left 09/18/2020   Procedure: APPLICATION OF CRANIAL NAVIGATION;  Surgeon: Consuella Lose, MD;  Location: [facility];  Service: Neurosurgery;  Laterality: Left;  . COLONOSCOPY  04/10/2011   Moderate predominantly sigmoid diverticulosis. Small internal hemorrhoids.   . COLONOSCOPY  01/30/2020  . CRANIOTOMY Left 09/18/2020   Procedure: LEFT FRONTAL CRANIOTOMY FOR  TUMOR;  Surgeon: Consuella Lose, MD;  Location: [facility];  Service: Neurosurgery;  Laterality: Left;  left  . CYST REMOVAL NECK     AGE 69  . ESOPHAGOGASTRODUODENOSCOPY  04/10/2011   Erosive esophagitis with esophageal stricture (asymptomatic strictures since the patient not having any dysphagia). LA grade D esophagitis.  . INGUINAL HERNIA REPAIR     LEFT AS CHILD  . TOTAL KNEE ARTHROPLASTY Right 03/17/2016   Procedure: TOTAL KNEE ARTHROPLASTY;  Surgeon: [name], MD;  Location: [facility];  Service: Orthopedics;  Laterality: Right;  . watchman procedure  07/2019   Social History:  Social History   Socioeconomic History  . Marital status: Married    Spouse name: Not on file  . Number of children: Not on file  . Years of education: Not on file  . Highest education level: Not on file  Occupational History  . Not on file  Tobacco Use  . Smoking status: Never Smoker  . Smokeless tobacco: Never Used  Vaping Use  . Vaping Use: Never  used  Substance and Sexual Activity  . Alcohol use: No  . Drug use: No  . Sexual activity: Not on file  Other Topics Concern  . Not on file  Social History Narrative  . Not on file   Social Determinants of Health   Financial Resource Strain: Not on file  Food Insecurity: Not on file  Transportation Needs: Not on file  Physical Activity: Not on file  Stress: Not on file  Social Connections: Not on file  Intimate Partner Violence: Not on file   Family History:  Family History  Problem Relation Age of Onset  . Lung cancer Father   . Colon cancer Neg Hx   . Esophageal cancer Neg Hx   . Colon polyps Neg Hx   . Rectal cancer Neg Hx   . Stomach cancer Neg Hx     Review of Systems: Constitutional: Doesn't report fevers, chills or abnormal weight loss Eyes: Doesn't  report blurriness of vision Ears, nose, mouth, throat, and face: Doesn't report sore throat Respiratory: Doesn't report cough, dyspnea or wheezes Cardiovascular: Doesn't report palpitation, chest discomfort  Gastrointestinal:  Doesn't report nausea, constipation, diarrhea GU: Doesn't report incontinence Skin: Doesn't report skin rashes Neurological: Per HPI Musculoskeletal: Doesn't report joint pain Behavioral/Psych: Doesn't report anxiety  Physical Exam: Vitals:   02/14/21 1127  BP: 114/63  Pulse: 79  Resp: 18  Temp: 97.6 F (36.4 C)  SpO2: 98%   KPS: 80. General: Alert, cooperative, pleasant, in no acute distress Head: Normal EENT: No conjunctival injection or scleral icterus.  Lungs: Resp effort normal Cardiac: Regular rate Abdomen: Non-distended abdomen Skin: No rashes cyanosis or petechiae. Extremities: No clubbing or edema  Neurologic Exam: Mental Status: Awake, alert, attentive to examiner. Oriented to self and environment. Language is fluent with intact comprehension.  Cranial Nerves: Visual acuity is grossly normal. Visual fields are full. Extra-ocular movements intact. No ptosis. Face is  symmetric Motor: Tone and bulk are normal. Power is full in both arms and legs. Reflexes are symmetric, no pathologic reflexes present.  Sensory: Intact to light touch Gait: Normal.   Labs: I have reviewed the data as listed    Component Value Date/Time   NA 148 (H) 02/14/2021 1056   NA 143 02/13/2020 1037   K 3.9 02/14/2021 1056   CL 109 02/14/2021 1056   CO2 28 02/14/2021 1056   GLUCOSE 152 (H) 02/14/2021 1056   BUN 15 02/14/2021 1056   BUN 13 02/13/2020 1037   CREATININE 1.35 (H) 02/14/2021 1056   CALCIUM 9.2 02/14/2021 1056   PROT 7.0 02/14/2021 1056   PROT 6.7 02/13/2020 1037   ALBUMIN 3.9 02/14/2021 1056   ALBUMIN 4.3 02/13/2020 1037   AST 16 02/14/2021 1056   ALT 14 02/14/2021 1056   ALKPHOS 83 02/14/2021 1056   BILITOT 0.4 02/14/2021 1056   GFRNONAA 56 (L) 02/14/2021 2355   GFRAA DUPLICATE 73/22/0254 2706   Lab Results  Component Value Date   WBC 4.5 02/14/2021   NEUTROABS 2.8 02/14/2021   HGB 14.8 02/14/2021   HCT 45.3 02/14/2021   MCV 88.1 02/14/2021   PLT 224 02/14/2021   Imaging:  [facility] Clinician Interpretation: I have personally reviewed the CNS images as listed.  My interpretation, in the context of the patient's clinical presentation, is likely treatment effect  MR BRAIN W WO CONTRAST  Result Date: 02/08/2021 CLINICAL DATA:  Frontal glioblastoma.  Assess response to treatment. EXAM: MRI HEAD WITHOUT AND WITH CONTRAST TECHNIQUE: Multiplanar, multiecho pulse sequences of the brain and surrounding structures were obtained without and with intravenous contrast. CONTRAST:  64m GADAVIST GADOBUTROL 1 MMOL/ML IV SOLN COMPARISON:  MRI head 11/28/2020.  09/19/2020 FINDINGS: Brain: Left frontal tumor resection. Hemosiderin lined surgical cavity communicates with the left lateral ventricle. Hyperintensity surrounding the surgical cavity shows mild progression both anteriorly and superiorly. There is mild irregular enhancement anterior to the wall of the cavity which  appears slightly progressive. This is best seen on axial and coronal images and is less impressive on sagittal images. There is a small amount of enhancement along the posterior and superior aspect of the cavity which appears slightly improved on the sagittal images. Small area of enhancement in the left internal capsule unchanged and could represent tumor. Negative for acute infarct. Mild white matter changes. Chronic blood products in the ventricles and subarachnoid space and posterior fossa unchanged. Negative for hydrocephalus. Vascular: Normal arterial flow voids. Skull and upper cervical spine: Left  frontal craniotomy. 10 mm thick fluid collection deep to the craniotomy flap is unchanged. Sinuses/Orbits: Mild mucosal edema paranasal sinuses. Negative orbit Other: None IMPRESSION: Postsurgical resection left frontal lobe tumor. Subtle progressive changes are present. There is mild progression of FLAIR hyperintensity in the surrounding white matter especially anterior and inferior to the surgical cavity. There is slight increase in irregular enhancement along the anterior wall of the cavity. Small amount of enhancement in the left internal capsule is unchanged. Small amount of enhancement along the posterior wall of the surgical cavity appears slightly improved. Electronically Signed   By: Franchot Gallo M.D.   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  Brain MRI demonstrates subtle changes which are likely consistent with post-radiation treatment effect rather than disease progression. Labs are within normal limits.  We recommended continuing treatment with cycle #3 Temozolomide, dose increased to 200 mg/m2, on for five days and off for twenty three days in twenty eight day cycles. The patient will have a complete blood count performed on days 21 and 28 of each  cycle, and a comprehensive metabolic panel performed on day 28 of each cycle. Labs may need to be performed more often. Zofran will prescribed for home use for nausea/vomiting.   Chemotherapy should be held for the following:  ANC less than 1,000  Platelets less than 100,000  LFT or creatinine greater than 2x ULN  If clinical concerns/contraindications develop  Keppra should maintain at 1072m BID for now.  Grateful that he will continue to follow with Dr. EMarin Olpfor suspected renal cell carcinoma, currently monitoring with imaging only.  We ask that DGardenia Phlegmreturn to clinic in 1 month with labs for evaluation prior to cycle #4.  All questions were answered. The patient knows to call the clinic with any problems, questions or concerns. No barriers to learning were detected.  I have spent a total of 30 minutes of face-to-face and non-face-to-face time, excluding clinical staff time, preparing to see patient, ordering tests and/or medications, counseling the patient, and independently interpreting results and communicating results to the patient/family/caregiver    ZVentura Sellers MD Medical Director of Neuro-Oncology [facility] [place]03/31/22 11:14 AM

## 2021-02-18 ENCOUNTER — Inpatient Hospital Stay: Payer: Medicare Other | Attending: Internal Medicine

## 2021-02-18 DIAGNOSIS — C711 Malignant neoplasm of frontal lobe: Secondary | ICD-10-CM | POA: Insufficient documentation

## 2021-02-18 DIAGNOSIS — E782 Mixed hyperlipidemia: Secondary | ICD-10-CM | POA: Diagnosis not present

## 2021-02-18 DIAGNOSIS — R7303 Prediabetes: Secondary | ICD-10-CM | POA: Diagnosis not present

## 2021-02-18 DIAGNOSIS — Z79899 Other long term (current) drug therapy: Secondary | ICD-10-CM | POA: Insufficient documentation

## 2021-02-18 DIAGNOSIS — I129 Hypertensive chronic kidney disease with stage 1 through stage 4 chronic kidney disease, or unspecified chronic kidney disease: Secondary | ICD-10-CM | POA: Diagnosis not present

## 2021-02-18 DIAGNOSIS — N182 Chronic kidney disease, stage 2 (mild): Secondary | ICD-10-CM | POA: Diagnosis not present

## 2021-02-19 ENCOUNTER — Telehealth: Payer: Self-pay | Admitting: Internal Medicine

## 2021-02-19 NOTE — Telephone Encounter (Signed)
Scheduled per los. Called and spoke with patients wife. Confirmed appt  

## 2021-02-21 ENCOUNTER — Other Ambulatory Visit (HOSPITAL_COMMUNITY): Payer: Self-pay

## 2021-02-23 ENCOUNTER — Ambulatory Visit (HOSPITAL_BASED_OUTPATIENT_CLINIC_OR_DEPARTMENT_OTHER)
Admission: RE | Admit: 2021-02-23 | Discharge: 2021-02-23 | Disposition: A | Discharge status: Home / Self Care | Payer: Medicare Other | Source: Ambulatory Visit | Attending: Hematology & Oncology | Admitting: Hematology & Oncology

## 2021-02-23 ENCOUNTER — Other Ambulatory Visit: Payer: Self-pay

## 2021-02-23 DIAGNOSIS — M439 Deforming dorsopathy, unspecified: Secondary | ICD-10-CM | POA: Diagnosis not present

## 2021-02-23 DIAGNOSIS — N2889 Other specified disorders of kidney and ureter: Secondary | ICD-10-CM | POA: Diagnosis not present

## 2021-02-23 DIAGNOSIS — K7689 Other specified diseases of liver: Secondary | ICD-10-CM | POA: Diagnosis not present

## 2021-02-23 DIAGNOSIS — N281 Cyst of kidney, acquired: Secondary | ICD-10-CM | POA: Diagnosis not present

## 2021-02-23 DIAGNOSIS — Z85841 Personal history of malignant neoplasm of brain: Secondary | ICD-10-CM | POA: Diagnosis not present

## 2021-02-23 MED ORDER — GADOBUTROL 1 MMOL/ML IV SOLN
7.0000 mL | Freq: Once | INTRAVENOUS | Status: AC | PRN
  Administered 2021-02-23: 7 mL via INTRAVENOUS

## 2021-02-25 ENCOUNTER — Encounter: Payer: Self-pay | Admitting: Hematology & Oncology

## 2021-02-25 ENCOUNTER — Inpatient Hospital Stay (HOSPITAL_BASED_OUTPATIENT_CLINIC_OR_DEPARTMENT_OTHER): Payer: Medicare Other | Admitting: Hematology & Oncology

## 2021-02-25 ENCOUNTER — Other Ambulatory Visit: Payer: Self-pay

## 2021-02-25 ENCOUNTER — Inpatient Hospital Stay: Payer: Medicare Other | Attending: Internal Medicine

## 2021-02-25 VITALS — BP 126/67 | HR 74 | Temp 98.2°F | Resp 18 | Wt 153.0 lb

## 2021-02-25 DIAGNOSIS — N281 Cyst of kidney, acquired: Secondary | ICD-10-CM | POA: Diagnosis not present

## 2021-02-25 DIAGNOSIS — N200 Calculus of kidney: Secondary | ICD-10-CM

## 2021-02-25 DIAGNOSIS — C711 Malignant neoplasm of frontal lobe: Secondary | ICD-10-CM | POA: Diagnosis not present

## 2021-02-25 DIAGNOSIS — N2889 Other specified disorders of kidney and ureter: Secondary | ICD-10-CM

## 2021-02-25 LAB — CBC WITH DIFFERENTIAL (CANCER CENTER ONLY)
Abs Immature Granulocytes: 0.03 10*3/uL (ref 0.00–0.07)
Basophils Absolute: 0.1 10*3/uL (ref 0.0–0.1)
Basophils Relative: 1 %
Eosinophils Absolute: 0.5 10*3/uL (ref 0.0–0.5)
Eosinophils Relative: 10 %
HCT: 44.7 % (ref 39.0–52.0)
Hemoglobin: 15.1 g/dL (ref 13.0–17.0)
Immature Granulocytes: 1 %
Lymphocytes Relative: 28 %
Lymphs Abs: 1.4 10*3/uL (ref 0.7–4.0)
MCH: 29.3 pg (ref 26.0–34.0)
MCHC: 33.8 g/dL (ref 30.0–36.0)
MCV: 86.8 fL (ref 80.0–100.0)
Monocytes Absolute: 0.5 10*3/uL (ref 0.1–1.0)
Monocytes Relative: 10 %
Neutro Abs: 2.7 10*3/uL (ref 1.7–7.7)
Neutrophils Relative %: 50 %
Platelet Count: 222 10*3/uL (ref 150–400)
RBC: 5.15 MIL/uL (ref 4.22–5.81)
RDW: 13.5 % (ref 11.5–15.5)
WBC Count: 5.2 10*3/uL (ref 4.0–10.5)
nRBC: 0 % (ref 0.0–0.2)

## 2021-02-25 LAB — CMP (CANCER CENTER ONLY)
ALT: 24 U/L (ref 0–44)
AST: 23 U/L (ref 15–41)
Albumin: 4.3 g/dL (ref 3.5–5.0)
Alkaline Phosphatase: 76 U/L (ref 38–126)
Anion gap: 9 (ref 5–15)
BUN: 13 mg/dL (ref 8–23)
CO2: 27 mmol/L (ref 22–32)
Calcium: 10 mg/dL (ref 8.9–10.3)
Chloride: 107 mmol/L (ref 98–111)
Creatinine: 1.09 mg/dL (ref 0.61–1.24)
GFR, Estimated: >60 mL/min (ref >60)
Glucose, Bld: 121 mg/dL — ABNORMAL HIGH (ref 70–99)
Potassium: 3.7 mmol/L (ref 3.5–5.1)
Sodium: 143 mmol/L (ref 135–145)
Total Bilirubin: 0.5 mg/dL (ref 0.3–1.2)
Total Protein: 6.9 g/dL (ref 6.5–8.1)

## 2021-02-25 LAB — LACTATE DEHYDROGENASE: LDH: 164 U/L (ref 98–192)

## 2021-02-25 NOTE — Progress Notes (Signed)
Hematology and Oncology Follow Up Visit  Mathew Leach [id number] July 16, 1951 70 y.o. 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  He actually looks quite good.  He has been followed incredibly closely and expertly by Dr. Mickeal Skinner.  He had MRI of the brain.  This was done on 02/08/2021.  This looked fine without any evidence of recurrent glioblastoma.  He is doing well with Temodar.  We have been watching his potential renal problem.  He has had this complex renal cyst on the right kidney.  We did an MRI over the weekend.  MRI did show slight increase in growth in this complex renal cyst.  It now measures 5.4 x 4.9 cm.  Because of the slight growth, I think we will have to think about having this removed.  The radiologist feels that this is a cystic renal malignancy.  I will have to speak with urology regarding this.  He is having no abdominal pain.  He is having no problems with nausea or vomiting.  He is having no dysuria.  There is no hematuria.  His appetite is doing well.  He is quite active.  Overall, I would say his performance status is ECOG 1.  Medications:  Current Outpatient Medications:  .  amLODipine-benazepril (LOTREL) 10-20 MG capsule, Take 1 capsule by mouth daily., Disp: , Rfl:  .  levETIRAcetam (KEPPRA) 1000 MG tablet, Take 1 tablet (1,000 mg total) by mouth 2 (two) times daily., Disp: 60 tablet, Rfl: 0 .  ondansetron (ZOFRAN) 8 MG tablet, TAKE 1 TABLET BY MOUTH 2 TIMES DAILY AS NEEDED NAUSEA AND VOMITING MAY TAKE 30-60 MINS PRIOR TO TEMODAR ADMINISTRATION IF NAUSEA/VOMITING OCCURS, Disp: 30 tablet, Rfl: 1 .  PRESCRIPTION MEDICATION, See admin instructions. CPAP- At bedtime, Disp: , Rfl:  .   simvastatin (ZOCOR) 20 MG tablet, Take 1 tablet (20 mg total) by mouth daily. (Patient taking differently: Take 20 mg by mouth daily with lunch.), Disp: 90 tablet, Rfl: 3 .  temozolomide (TEMODAR) 180 MG capsule, TAKE 2 CAPSULES (360 MG TOTAL) BY MOUTH DAILY FOR 5 DAYS. MAY TAKE ON AN EMPTY STOMACH TO DECREASE NAUSEA & VOMITING., Disp: 10 capsule, Rfl: 0  Allergies:  Allergies  Allergen Reactions  . Lipitor [Atorvastatin] Other (See Comments)    Caused DOUBLE VISION     Past Medical History, Surgical history, Social history, and Family History were reviewed and updated.  Review of Systems: Review of Systems  Constitutional: Negative.   HENT:  Negative.   Eyes: Negative.   Respiratory: Negative.   Cardiovascular: Negative.   Gastrointestinal: Negative.   Endocrine: Negative.   Genitourinary: Negative.    Musculoskeletal: Negative.   Skin: Negative.   Neurological: Negative.   Hematological: Negative.   Psychiatric/Behavioral: Negative.     Physical Exam:  weight is 153 lb (69.4 kg). His oral temperature is 98.2 F (36.8 C). His blood pressure is 126/67 and his pulse is 74. His respiration is 18 and oxygen saturation is 98%.   Wt Readings from Last 3 Encounters:  02/25/21 153 lb (69.4 kg)  02/14/21 152 lb 4.8 oz (69.1 kg)  01/10/21 152 lb 1.6 oz (69 kg)    Physical Exam Vitals reviewed.  HENT:  Head: Normocephalic and atraumatic.     Comments: Head exam shows the craniotomy scar in the left frontoparietal region.  This is well-healed. Eyes:     Pupils: Pupils are equal, round, and reactive to light.  Cardiovascular:     Rate and Rhythm: Normal rate and regular rhythm.     Heart sounds: Normal heart sounds.  Pulmonary:     Effort: Pulmonary effort is normal.     Breath sounds: Normal breath sounds.  Abdominal:     General: Bowel sounds are normal.     Palpations: Abdomen is soft.  Musculoskeletal:        General: No tenderness or deformity. Normal range of  motion.     Cervical back: Normal range of motion.  Lymphadenopathy:     Cervical: No cervical adenopathy.  Skin:    General: Skin is warm and dry.     Findings: No erythema or rash.  Neurological:     Mental Status: He is alert and oriented to person, place, and time.  Psychiatric:        Behavior: Behavior normal.        Thought Content: Thought content normal.        Judgment: Judgment normal.    Lab Results  Component Value Date   WBC 5.2 02/25/2021   HGB 15.1 02/25/2021   HCT 44.7 02/25/2021   MCV 86.8 02/25/2021   PLT 222 02/25/2021     Chemistry      Component Value Date/Time   NA 143 02/25/2021 0944   NA 143 02/13/2020 1037   K 3.7 02/25/2021 0944   CL 107 02/25/2021 0944   CO2 27 02/25/2021 0944   BUN 13 02/25/2021 0944   BUN 13 02/13/2020 1037   CREATININE 1.09 02/25/2021 0944      Component Value Date/Time   CALCIUM 10.0 02/25/2021 0944   ALKPHOS 76 02/25/2021 0944   AST 23 02/25/2021 0944   ALT 24 02/25/2021 0944   BILITOT 0.5 02/25/2021 0944      Impression and Plan: Mathew Leach is a very nice 70 year old white male.  He presented with a glioblastoma of the left frontal lobe.  This was resected.  He underwent adjuvant therapy with radiation and Temodar.  He is now on Temodar alone.  When he presented with a glioblastoma, he also was found to have this renal cyst.  However, is felt that the glioblastoma was much more of a problem.  As such, this was taken care of.  I think that we now are in a position that we can think about evaluating this renal cyst.  Again, radiology feels that this is malignant.  I will have to make a referral to urology.  We will see if they might be able to see Mathew Leach and decide what might need to be done.  I think he does need a nephrectomy, I would have no problems with this.  We can get this taken care of while his blood counts are still okay.  If urology thinks that we can wait until Mathew Leach is done with the  Temodar, I think this would also be reasonable.  I know this is quite complicated.  I would like to see him back myself in another 6 weeks or so.     Volanda Napoleon, MD 4/11/202211:29 AM

## 2021-02-27 ENCOUNTER — Telehealth: Payer: Self-pay

## 2021-02-27 NOTE — Telephone Encounter (Signed)
-----   Message from Volanda Napoleon, MD sent at 02/26/2021  8:02 AM EDT ----- I was a message for Mathew Leach.  I told him that the MRI showed that this lesion in the kidney is a little larger.  As such, I do have to worry about this being a malignant lesion.  I will have to make a referral over to urology so that they can see him and see about the possibility of a partial nephrectomy or total nephrectomy.  There does not look like there is any metastatic or locally advanced issues. I told him to give [place] a call if there are any questions.  Laurey Arrow

## 2021-03-07 ENCOUNTER — Other Ambulatory Visit (HOSPITAL_COMMUNITY): Payer: Self-pay

## 2021-03-14 ENCOUNTER — Other Ambulatory Visit (HOSPITAL_COMMUNITY): Payer: Self-pay

## 2021-03-14 ENCOUNTER — Other Ambulatory Visit: Payer: Self-pay

## 2021-03-14 ENCOUNTER — Inpatient Hospital Stay: Payer: Medicare Other

## 2021-03-14 ENCOUNTER — Inpatient Hospital Stay (HOSPITAL_BASED_OUTPATIENT_CLINIC_OR_DEPARTMENT_OTHER): Payer: Medicare Other | Admitting: Internal Medicine

## 2021-03-14 VITALS — BP 131/73 | HR 65 | Temp 97.0°F | Resp 16 | Ht 67.0 in | Wt 154.5 lb

## 2021-03-14 DIAGNOSIS — C711 Malignant neoplasm of frontal lobe: Secondary | ICD-10-CM

## 2021-03-14 DIAGNOSIS — Z79899 Other long term (current) drug therapy: Secondary | ICD-10-CM | POA: Diagnosis not present

## 2021-03-14 LAB — CMP (CANCER CENTER ONLY)
ALT: 13 U/L (ref 0–44)
AST: 18 U/L (ref 15–41)
Albumin: 4.1 g/dL (ref 3.5–5.0)
Alkaline Phosphatase: 94 U/L (ref 38–126)
Anion gap: 10 (ref 5–15)
BUN: 14 mg/dL (ref 8–23)
CO2: 27 mmol/L (ref 22–32)
Calcium: 9.4 mg/dL (ref 8.9–10.3)
Chloride: 108 mmol/L (ref 98–111)
Creatinine: 1.03 mg/dL (ref 0.61–1.24)
GFR, Estimated: >60 mL/min (ref >60)
Glucose, Bld: 106 mg/dL — ABNORMAL HIGH (ref 70–99)
Potassium: 4.1 mmol/L (ref 3.5–5.1)
Sodium: 145 mmol/L (ref 135–145)
Total Bilirubin: 0.5 mg/dL (ref 0.3–1.2)
Total Protein: 7.2 g/dL (ref 6.5–8.1)

## 2021-03-14 LAB — CBC WITH DIFFERENTIAL (CANCER CENTER ONLY)
Abs Immature Granulocytes: 0.02 10*3/uL (ref 0.00–0.07)
Basophils Absolute: 0.1 10*3/uL (ref 0.0–0.1)
Basophils Relative: 2 %
Eosinophils Absolute: 0.3 10*3/uL (ref 0.0–0.5)
Eosinophils Relative: 5 %
HCT: 45 % (ref 39.0–52.0)
Hemoglobin: 14.8 g/dL (ref 13.0–17.0)
Immature Granulocytes: 0 %
Lymphocytes Relative: 25 %
Lymphs Abs: 1.2 10*3/uL (ref 0.7–4.0)
MCH: 29.1 pg (ref 26.0–34.0)
MCHC: 32.9 g/dL (ref 30.0–36.0)
MCV: 88.6 fL (ref 80.0–100.0)
Monocytes Absolute: 0.4 10*3/uL (ref 0.1–1.0)
Monocytes Relative: 9 %
Neutro Abs: 2.8 10*3/uL (ref 1.7–7.7)
Neutrophils Relative %: 59 %
Platelet Count: 253 10*3/uL (ref 150–400)
RBC: 5.08 MIL/uL (ref 4.22–5.81)
RDW: 13.5 % (ref 11.5–15.5)
WBC Count: 4.8 10*3/uL (ref 4.0–10.5)
nRBC: 0 % (ref 0.0–0.2)

## 2021-03-14 MED ORDER — TEMOZOLOMIDE 180 MG PO CAPS
200.0000 mg/m2/d | ORAL_CAPSULE | Freq: Every day | ORAL | 0 refills | Status: AC
Start: 2021-03-14 — End: 2021-04-12
  Filled 2021-03-14: qty 10, 28d supply, fill #0

## 2021-03-14 NOTE — Progress Notes (Signed)
[facility] at [address] [place], [place] [address] [phone]   Interval Evaluation  Date of Service: 03/14/21 Patient Name: Mathew Leach Patient MRN: [id number] Patient DOB: 11-29-1950 Provider: Ventura Sellers, MD  Identifying Statement:  Mathew Leach is a 70 y.o. male with left frontal glioblastoma   Referring Provider:  Oncologic History: Oncology History  Frontal glioblastoma multiforme ([place])  09/18/2020 Surgery   Left frontal craniotomy, resection by Dr. Kathyrn Sheriff; path demonstrates Glioblastaoma IDH-wt   10/17/2020 - 10/17/2020 Chemotherapy    Patient is on Treatment Plan: BRAIN GLIOBLASTOMA POST XRT TEMOZOLOMIDE DAYS 1-5 Q28 DAYS       12/10/2020 -  Chemotherapy    Patient is on Treatment Plan: BRAIN GLIOBLASTOMA POST XRT TEMOZOLOMIDE DAYS 1-5 Q28 DAYS         Biomarkers:  MGMT Unknown.  IDH 1/2 Wild type.  EGFR Unknown  TERT Unknown   Interval History: Mathew Leach presents to clinic today, now having completed 3 cycles of 5-day adjuvant Temozolomide.  No issues tolerating the chemo this month.  Denies new or progressive neurologic deficits.  Still minimally active at home, not motivated.  Otherwise remains independent with gait, basic ADLs.  Did visit with Dr. Marin Olp, MRI of adbomen showed growth of the kidney lesion.  He has been referred to urology for possible surgical intervention (nephrectomy or biopsy). Denies further seizures, no headaches.  H+P (10/04/20)  Mathew Leach presented to medical attention three weeks ago with new onset generalized seizure.  This occurred at work, unwitnessed, and then second event was witnessed by EMS described as generalized shaking.  CNS imaging demonstrated left frontal mass lesion, which was resected by Dr. Kathyrn Sheriff on 09/18/20.  Following surgery, he had no clinical complaints.  Workup did also demonstrate mass within kidney possibly c/w renal cell  carcinoma.  He presents today for path review; he is fully independent for age, lives with his wife.   Medications: Current Outpatient Medications on File Prior to Visit  Medication Sig Dispense Refill  . amLODipine-benazepril (LOTREL) 10-20 MG capsule Take 1 capsule by mouth daily.    Marland Kitchen levETIRAcetam (KEPPRA) 1000 MG tablet Take 1 tablet (1,000 mg total) by mouth 2 (two) times daily. 60 tablet 0  . PRESCRIPTION MEDICATION See admin instructions. CPAP- At bedtime    . simvastatin (ZOCOR) 20 MG tablet Take 1 tablet (20 mg total) by mouth daily. (Patient taking differently: Take 20 mg by mouth daily with lunch.) 90 tablet 3  . ondansetron (ZOFRAN) 8 MG tablet TAKE 1 TABLET BY MOUTH 2 TIMES DAILY AS NEEDED NAUSEA AND VOMITING MAY TAKE 30-60 MINS PRIOR TO TEMODAR ADMINISTRATION IF NAUSEA/VOMITING OCCURS (Patient not taking: Reported on 03/14/2021) 30 tablet 1  . temozolomide (TEMODAR) 180 MG capsule TAKE 2 CAPSULES (360 MG TOTAL) BY MOUTH DAILY FOR 5 DAYS. MAY TAKE ON AN EMPTY STOMACH TO DECREASE NAUSEA & VOMITING. (Patient not taking: Reported on 03/14/2021) 10 capsule 0   No current facility-administered medications on file prior to visit.    Allergies:  Allergies  Allergen Reactions  . Lipitor [Atorvastatin] Other (See Comments)    Caused DOUBLE VISION    Past Medical History:  Past Medical History:  Diagnosis Date  . Actinic keratosis    scalp  . Arthritis   . ASD (atrial septal defect) 06/10/2015  . Atrial flutter, paroxysmal ([place]) 08/01/2015  . Chronic anticoagulation 08/29/2015  . Chronic pain of right knee 12/07/2014  .  Dysplastic nevus 01/11/2015   upper back spinal  . Dysplastic nevus 01/29/2017   right superior calf inferior to popliteal  . Dysrhythmia    AFIB  . Goals of care, counseling/discussion 09/17/2020  . Hypertension   . Kidney stones   . Obstructive sleep apnea syndrome 06/26/2015  . PAF (paroxysmal atrial fibrillation) ([place]) 07/30/2015  . Pericarditis    1999  .  PFO (patent foramen ovale) 06/16/2018  . Preoperative cardiovascular examination 02/03/2016  . 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 Shortness of breath dyspnea    W/ EXERTION   . Sleep apnea    CPAP  . Stroke [facility])    04-2015   Past Surgical History:  Past Surgical History:  Procedure Laterality Date  . APPLICATION OF CRANIAL NAVIGATION Left 09/18/2020   Procedure: APPLICATION OF CRANIAL NAVIGATION;  Surgeon: Consuella Lose, MD;  Location: [facility];  Service: Neurosurgery;  Laterality: Left;  . COLONOSCOPY  04/10/2011   Moderate predominantly sigmoid diverticulosis. Small internal hemorrhoids.   . COLONOSCOPY  01/30/2020  . CRANIOTOMY Left 09/18/2020   Procedure: LEFT FRONTAL CRANIOTOMY FOR  TUMOR;  Surgeon: Consuella Lose, MD;  Location: [facility];  Service: Neurosurgery;  Laterality: Left;  left  . CYST REMOVAL NECK     AGE 20  . ESOPHAGOGASTRODUODENOSCOPY  04/10/2011   Erosive esophagitis with esophageal stricture (asymptomatic strictures since the patient not having any dysphagia). LA grade D esophagitis.  . INGUINAL HERNIA REPAIR     LEFT AS CHILD  . TOTAL KNEE ARTHROPLASTY Right 03/17/2016   Procedure: TOTAL KNEE ARTHROPLASTY;  Surgeon: [name], MD;  Location: [facility];  Service: Orthopedics;  Laterality: Right;  . watchman procedure  07/2019   Social History:  Social History   Socioeconomic History  . Marital status: Married    Spouse name: Not on file  . Number of children: Not on file  . Years of education: Not on file  . Highest education level: Not on file  Occupational History  . Not on file  Tobacco Use  . Smoking status: Never Smoker  . Smokeless tobacco: Never Used  Vaping Use  . Vaping Use: Never used  Substance and Sexual Activity  . Alcohol use: No  . Drug use: No  . Sexual activity: Not on file  Other Topics Concern  . Not on file  Social History Narrative  . Not on file   Social Determinants of Health   Financial  Resource Strain: Not on file  Food Insecurity: Not on file  Transportation Needs: Not on file  Physical Activity: Not on file  Stress: Not on file  Social Connections: Not on file  Intimate Partner Violence: Not on file   Family History:  Family History  Problem Relation Age of Onset  . Lung cancer Father   . Colon cancer Neg Hx   . Esophageal cancer Neg Hx   . Colon polyps Neg Hx   . Rectal cancer Neg Hx   . Stomach cancer Neg Hx     Review of Systems: Constitutional: Doesn't report fevers, chills or abnormal weight loss Eyes: Doesn't report blurriness of vision Ears, nose, mouth, throat, and face: Doesn't report sore throat Respiratory: Doesn't report cough, dyspnea or wheezes Cardiovascular: Doesn't report palpitation, chest discomfort  Gastrointestinal:  Doesn't report nausea, constipation, diarrhea GU: Doesn't report incontinence Skin: Doesn't report skin rashes Neurological: Per HPI Musculoskeletal: Doesn't report joint pain Behavioral/Psych: Doesn't report anxiety  Physical Exam: Vitals:   03/14/21  1142  BP: 131/73  Pulse: 65  Resp: 16  Temp: (!) 97 F (36.1 C)  SpO2: 99%   KPS: 80. General: Alert, cooperative, pleasant, in no acute distress Head: Normal EENT: No conjunctival injection or scleral icterus.  Lungs: Resp effort normal Cardiac: Regular rate Abdomen: Non-distended abdomen Skin: No rashes cyanosis or petechiae. Extremities: No clubbing or edema  Neurologic Exam: Mental Status: Awake, alert, attentive to examiner. Oriented to self and environment. Language is fluent with intact comprehension.  Cranial Nerves: Visual acuity is grossly normal. Visual fields are full. Extra-ocular movements intact. No ptosis. Face is symmetric Motor: Tone and bulk are normal. Power is full in both arms and legs. Reflexes are symmetric, no pathologic reflexes present.  Sensory: Intact to light touch Gait: Normal.   Labs: I have reviewed the data as listed     Component Value Date/Time   NA 143 02/25/2021 0944   NA 143 02/13/2020 1037   K 3.7 02/25/2021 0944   CL 107 02/25/2021 0944   CO2 27 02/25/2021 0944   GLUCOSE 121 (H) 02/25/2021 0944   BUN 13 02/25/2021 0944   BUN 13 02/13/2020 1037   CREATININE 1.09 02/25/2021 0944   CALCIUM 10.0 02/25/2021 0944   PROT 6.9 02/25/2021 0944   PROT 6.7 02/13/2020 1037   ALBUMIN 4.3 02/25/2021 0944   ALBUMIN 4.3 02/13/2020 1037   AST 23 02/25/2021 0944   ALT 24 02/25/2021 0944   ALKPHOS 76 02/25/2021 0944   BILITOT 0.5 02/25/2021 0944   GFRNONAA >60 02/25/2021 7829   GFRAA DUPLICATE 56/21/3086 5784   Lab Results  Component Value Date   WBC 4.8 03/14/2021   NEUTROABS 2.8 03/14/2021   HGB 14.8 03/14/2021   HCT 45.0 03/14/2021   MCV 88.6 03/14/2021   PLT 253 03/14/2021    Assessment/Plan Frontal glioblastoma multiforme (HCC) [C71.1]   Mathew Leach is clinically stable today, now having completed 3 cycles of adjuvant Temodar.  No new or progressive deficits today.  We recommended continuing treatment with cycle #4 Temozolomide, dose increased to 200 mg/m2, on for five days and off for twenty three days in twenty eight day cycles. The patient will have a complete blood count performed on days 21 and 28 of each cycle, and a comprehensive metabolic panel performed on day 28 of each cycle. Labs may need to be performed more often. Zofran will prescribed for home use for nausea/vomiting.   Chemotherapy should be held for the following:  ANC less than 1,000  Platelets less than 100,000  LFT or creatinine greater than 2x ULN  If clinical concerns/contraindications develop  Keppra should maintain at 1080m BID for now.  He will reach out to [place]fter visit with urology this month; we can alter treatment schedule around surgical plan as needed.  We ask that DGardenia Phlegmreturn to clinic in 1 month with MRI brain for evaluation prior to cycle #5.  All questions were answered. The  patient knows to call the clinic with any problems, questions or concerns. No barriers to learning were detected.  I have spent a total of 30 minutes of face-to-face and non-face-to-face time, excluding clinical staff time, preparing to see patient, ordering tests and/or medications, counseling the patient, and independently interpreting results and communicating results to the patient/family/caregiver    ZVentura Sellers MD Medical Director of Neuro-Oncology [facility]04/28/22 11:50 AM

## 2021-03-15 ENCOUNTER — Other Ambulatory Visit (HOSPITAL_COMMUNITY): Payer: Self-pay

## 2021-04-03 DIAGNOSIS — Z20822 Contact with and (suspected) exposure to covid-19: Secondary | ICD-10-CM | POA: Diagnosis not present

## 2021-04-04 ENCOUNTER — Other Ambulatory Visit (HOSPITAL_COMMUNITY): Payer: Self-pay

## 2021-04-04 DIAGNOSIS — U071 COVID-19: Secondary | ICD-10-CM | POA: Diagnosis not present

## 2021-04-04 DIAGNOSIS — Z20822 Contact with and (suspected) exposure to covid-19: Secondary | ICD-10-CM | POA: Diagnosis not present

## 2021-04-05 ENCOUNTER — Other Ambulatory Visit: Payer: Self-pay

## 2021-04-05 ENCOUNTER — Ambulatory Visit (HOSPITAL_COMMUNITY)
Admission: RE | Admit: 2021-04-05 | Discharge: 2021-04-05 | Disposition: A | Discharge status: Home / Self Care | Payer: Medicare Other | Source: Ambulatory Visit | Attending: Internal Medicine | Admitting: Internal Medicine

## 2021-04-05 DIAGNOSIS — C711 Malignant neoplasm of frontal lobe: Secondary | ICD-10-CM | POA: Diagnosis not present

## 2021-04-05 DIAGNOSIS — J323 Chronic sphenoidal sinusitis: Secondary | ICD-10-CM | POA: Diagnosis not present

## 2021-04-05 DIAGNOSIS — C719 Malignant neoplasm of brain, unspecified: Secondary | ICD-10-CM | POA: Diagnosis not present

## 2021-04-05 MED ORDER — GADOBUTROL 1 MMOL/ML IV SOLN
7.0000 mL | Freq: Once | INTRAVENOUS | Status: AC | PRN
  Administered 2021-04-05: 7 mL via INTRAVENOUS

## 2021-04-09 ENCOUNTER — Other Ambulatory Visit: Payer: Self-pay | Admitting: Internal Medicine

## 2021-04-09 ENCOUNTER — Telehealth: Payer: Self-pay | Admitting: Internal Medicine

## 2021-04-09 ENCOUNTER — Other Ambulatory Visit (HOSPITAL_COMMUNITY): Payer: Self-pay

## 2021-04-09 ENCOUNTER — Telehealth: Payer: Self-pay | Admitting: *Deleted

## 2021-04-09 DIAGNOSIS — C711 Malignant neoplasm of frontal lobe: Secondary | ICD-10-CM

## 2021-04-09 NOTE — Telephone Encounter (Signed)
Received call from patient's wife. She states they have been diagnosed with covid as of 04/04/21. Advised that we will need to push out his appt here for 2 more weeks. She voiced understanding. Scheduling message sent.  She also asked if patient would be starting back on his Temodar at the end of the month.   Please advise

## 2021-04-09 NOTE — Telephone Encounter (Signed)
R/s per Md's request, pt wife aware

## 2021-04-10 ENCOUNTER — Other Ambulatory Visit (HOSPITAL_COMMUNITY): Payer: Self-pay

## 2021-04-11 ENCOUNTER — Inpatient Hospital Stay: Payer: Medicare Other

## 2021-04-11 ENCOUNTER — Inpatient Hospital Stay: Payer: Medicare Other | Admitting: Internal Medicine

## 2021-04-16 ENCOUNTER — Encounter: Payer: Self-pay | Admitting: Internal Medicine

## 2021-04-16 ENCOUNTER — Other Ambulatory Visit (HOSPITAL_COMMUNITY): Payer: Self-pay

## 2021-04-20 ENCOUNTER — Encounter: Payer: Self-pay | Admitting: Internal Medicine

## 2021-04-25 ENCOUNTER — Inpatient Hospital Stay: Payer: Medicare Other | Attending: Internal Medicine

## 2021-04-25 ENCOUNTER — Inpatient Hospital Stay (HOSPITAL_BASED_OUTPATIENT_CLINIC_OR_DEPARTMENT_OTHER): Payer: Medicare Other | Admitting: Internal Medicine

## 2021-04-25 ENCOUNTER — Other Ambulatory Visit (HOSPITAL_COMMUNITY): Payer: Self-pay

## 2021-04-25 ENCOUNTER — Other Ambulatory Visit: Payer: Self-pay

## 2021-04-25 VITALS — BP 120/63 | HR 75 | Temp 97.3°F | Resp 18 | Ht 67.0 in | Wt 151.8 lb

## 2021-04-25 DIAGNOSIS — C711 Malignant neoplasm of frontal lobe: Secondary | ICD-10-CM | POA: Insufficient documentation

## 2021-04-25 DIAGNOSIS — R569 Unspecified convulsions: Secondary | ICD-10-CM

## 2021-04-25 LAB — CBC WITH DIFFERENTIAL (CANCER CENTER ONLY)
Abs Immature Granulocytes: 0.02 10*3/uL (ref 0.00–0.07)
Basophils Absolute: 0 10*3/uL (ref 0.0–0.1)
Basophils Relative: 1 %
Eosinophils Absolute: 0.3 10*3/uL (ref 0.0–0.5)
Eosinophils Relative: 5 %
HCT: 41.1 % (ref 39.0–52.0)
Hemoglobin: 13.9 g/dL (ref 13.0–17.0)
Immature Granulocytes: 0 %
Lymphocytes Relative: 23 %
Lymphs Abs: 1.1 10*3/uL (ref 0.7–4.0)
MCH: 29.8 pg (ref 26.0–34.0)
MCHC: 33.8 g/dL (ref 30.0–36.0)
MCV: 88.2 fL (ref 80.0–100.0)
Monocytes Absolute: 0.5 10*3/uL (ref 0.1–1.0)
Monocytes Relative: 9 %
Neutro Abs: 3 10*3/uL (ref 1.7–7.7)
Neutrophils Relative %: 62 %
Platelet Count: 192 10*3/uL (ref 150–400)
RBC: 4.66 MIL/uL (ref 4.22–5.81)
RDW: 14 % (ref 11.5–15.5)
WBC Count: 5 10*3/uL (ref 4.0–10.5)
nRBC: 0 % (ref 0.0–0.2)

## 2021-04-25 LAB — CMP (CANCER CENTER ONLY)
ALT: 14 U/L (ref 0–44)
AST: 14 U/L — ABNORMAL LOW (ref 15–41)
Albumin: 3.7 g/dL (ref 3.5–5.0)
Alkaline Phosphatase: 80 U/L (ref 38–126)
Anion gap: 9 (ref 5–15)
BUN: 12 mg/dL (ref 8–23)
CO2: 27 mmol/L (ref 22–32)
Calcium: 9.2 mg/dL (ref 8.9–10.3)
Chloride: 107 mmol/L (ref 98–111)
Creatinine: 1.19 mg/dL (ref 0.61–1.24)
GFR, Estimated: >60 mL/min (ref >60)
Glucose, Bld: 149 mg/dL — ABNORMAL HIGH (ref 70–99)
Potassium: 4 mmol/L (ref 3.5–5.1)
Sodium: 143 mmol/L (ref 135–145)
Total Bilirubin: 0.6 mg/dL (ref 0.3–1.2)
Total Protein: 6.5 g/dL (ref 6.5–8.1)

## 2021-04-25 MED ORDER — TEMOZOLOMIDE 180 MG PO CAPS
200.0000 mg/m2/d | ORAL_CAPSULE | Freq: Every day | ORAL | 0 refills | Status: AC
Start: 2021-04-25 — End: 2021-04-30
  Filled 2021-04-25: qty 10, 28d supply, fill #0

## 2021-04-25 NOTE — Progress Notes (Signed)
[facility] at [place], [place] [address] [phone]   Interval Evaluation  Date of Service: 04/25/21 Patient Name: Mathew Leach Patient MRN: [id number] Patient DOB: Aug 13, 1951 Provider: Ventura Sellers, MD  Identifying Statement:  Mathew Leach is a 70 y.o. male with left frontal glioblastoma   Referring Provider:  Oncologic History: Oncology History  Frontal glioblastoma multiforme ([place])  09/18/2020 Surgery   Left frontal craniotomy, resection by Dr. Kathyrn Sheriff; path demonstrates Glioblastaoma IDH-wt   10/17/2020 - 10/17/2020 Chemotherapy    Patient is on Treatment Plan: BRAIN GLIOBLASTOMA POST XRT TEMOZOLOMIDE DAYS 1-5 Q28 DAYS        12/10/2020 -  Chemotherapy    Patient is on Treatment Plan: BRAIN GLIOBLASTOMA POST XRT TEMOZOLOMIDE DAYS 1-5 Q28 DAYS          Biomarkers:  MGMT Unknown.  IDH 1/2 Wild type.  EGFR Unknown  TERT Unknown   Interval History: Mathew Leach presents to clinic today, now having completed 4 cycles of 5-day adjuvant Temozolomide.  No issues tolerating the chemo this month.  No new or progressive neurologic deficits today.  Still minimally active at home, not motivated.  Otherwise remains independent with gait, basic ADLs.  Has upcoming appointment with urology to review for possible kidney tumor surgery. Denies further seizures, no headaches.  H+P (10/04/20)  Mathew Leach presented to medical attention three weeks ago with new onset generalized seizure.  This occurred at work, unwitnessed, and then second event was witnessed by EMS described as generalized shaking.  CNS imaging demonstrated left frontal mass lesion, which was resected by Dr. Kathyrn Sheriff on 09/18/20.  Following surgery, he had no clinical complaints.  Workup did also demonstrate mass within kidney possibly c/w renal cell carcinoma.  He presents today for path review; he is fully independent for age, lives  with his wife.   Medications: Current Outpatient Medications on File Prior to Visit  Medication Sig Dispense Refill   amLODipine-benazepril (LOTREL) 10-20 MG capsule Take 1 capsule by mouth daily.     levETIRAcetam (KEPPRA) 1000 MG tablet Take 1 tablet (1,000 mg total) by mouth 2 (two) times daily. 60 tablet 0   ondansetron (ZOFRAN) 8 MG tablet TAKE 1 TABLET BY MOUTH 2 TIMES DAILY AS NEEDED NAUSEA AND VOMITING MAY TAKE 30-60 MINS PRIOR TO TEMODAR ADMINISTRATION IF NAUSEA/VOMITING OCCURS (Patient not taking: Reported on 03/14/2021) 30 tablet 1   PRESCRIPTION MEDICATION See admin instructions. CPAP- At bedtime     simvastatin (ZOCOR) 20 MG tablet Take 1 tablet (20 mg total) by mouth daily. (Patient taking differently: Take 20 mg by mouth daily with lunch.) 90 tablet 3   temozolomide (TEMODAR) 180 MG capsule TAKE 2 CAPSULES (360 MG TOTAL) BY MOUTH DAILY FOR 5 DAYS. MAY TAKE ON AN EMPTY STOMACH TO DECREASE NAUSEA & VOMITING. (Patient not taking: Reported on 03/14/2021) 10 capsule 0   No current facility-administered medications on file prior to visit.    Allergies:  Allergies  Allergen Reactions   Lipitor [Atorvastatin] Other (See Comments)    Caused DOUBLE VISION    Past Medical History:  Past Medical History:  Diagnosis Date   Actinic keratosis    scalp   Arthritis    ASD (atrial septal defect) 06/10/2015   Atrial flutter, paroxysmal ([place]) 08/01/2015   Chronic anticoagulation 08/29/2015   Chronic pain of right knee 12/07/2014   Dysplastic nevus 01/11/2015   upper back spinal   Dysplastic nevus 01/29/2017  right superior calf inferior to popliteal   Dysrhythmia    AFIB   Goals of care, counseling/discussion 09/17/2020   Hypertension    Kidney stones    Obstructive sleep apnea syndrome 06/26/2015   PAF (paroxysmal atrial fibrillation) ([place]) 07/30/2015   Pericarditis    1999   PFO (patent foramen ovale) 06/16/2018   Preoperative cardiovascular examination 02/03/2016   S/P TKR (total  knee replacement) using cement, 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   Shortness of breath dyspnea    W/ EXERTION    Sleep apnea    CPAP   Stroke ([place])    04-2015   Past Surgical History:  Past Surgical History:  Procedure Laterality Date   APPLICATION OF CRANIAL NAVIGATION Left 09/18/2020   Procedure: APPLICATION OF CRANIAL NAVIGATION;  Surgeon: Consuella Lose, MD;  Location: [facility];  Service: Neurosurgery;  Laterality: Left;   COLONOSCOPY  04/10/2011   Moderate predominantly sigmoid diverticulosis. Small internal hemorrhoids.    COLONOSCOPY  01/30/2020   CRANIOTOMY Left 09/18/2020   Procedure: LEFT FRONTAL CRANIOTOMY FOR  TUMOR;  Surgeon: Consuella Lose, MD;  Location: [facility];  Service: Neurosurgery;  Laterality: Left;  left   CYST REMOVAL NECK     AGE 67   ESOPHAGOGASTRODUODENOSCOPY  04/10/2011   Erosive esophagitis with esophageal stricture (asymptomatic strictures since the patient not having any dysphagia). LA grade D esophagitis.   INGUINAL HERNIA REPAIR     LEFT AS CHILD   TOTAL KNEE ARTHROPLASTY Right 03/17/2016   Procedure: TOTAL KNEE ARTHROPLASTY;  Surgeon: [name], MD;  Location: [facility];  Service: Orthopedics;  Laterality: Right;   watchman procedure  07/2019   Social History:  Social History   Socioeconomic History   Marital status: Married    Spouse name: Not on file   Number of children: Not on file   Years of education: Not on file   Highest education level: Not on file  Occupational History   Not on file  Tobacco Use   Smoking status: Never   Smokeless tobacco: Never  Vaping Use   Vaping Use: Never used  Substance and Sexual Activity   Alcohol use: No   Drug use: No   Sexual activity: Not on file  Other Topics Concern   Not on file  Social History Narrative   Not on file   Social Determinants of Health   Financial Resource Strain: Not on file  Food Insecurity: Not on file  Transportation Needs: Not on file  Physical Activity: Not on file  Stress: Not on file   Social Connections: Not on file  Intimate Partner Violence: Not on file   Family History:  Family History  Problem Relation Age of Onset   Lung cancer Father    Colon cancer Neg Hx    Esophageal cancer Neg Hx    Colon polyps Neg Hx    Rectal cancer Neg Hx    Stomach cancer Neg Hx     Review of Systems: Constitutional: Doesn't report fevers, chills or abnormal weight loss Eyes: Doesn't report blurriness of vision Ears, nose, mouth, throat, and face: Doesn't report sore throat Respiratory: Doesn't report cough, dyspnea or wheezes Cardiovascular: Doesn't report palpitation, chest discomfort  Gastrointestinal:  Doesn't report nausea, constipation, diarrhea GU: Doesn't report incontinence Skin: Doesn't report skin rashes Neurological: Per HPI Musculoskeletal: Doesn't report joint pain Behavioral/Psych: Doesn't report anxiety  Physical Exam: Vitals:   04/25/21 1158  BP: 120/63  Pulse: 75  Resp: 18  Temp: (!) 97.3 F (36.3 C)  SpO2: 97%    KPS: 80. General: Alert, cooperative, pleasant, in no acute distress Head: Normal EENT: No conjunctival injection or scleral icterus.  Lungs: Resp effort normal Cardiac: Regular rate Abdomen: Non-distended abdomen Skin: No rashes cyanosis or petechiae. Extremities: No clubbing or edema  Neurologic Exam: Mental Status: Awake, alert, attentive to examiner. Oriented to self and environment. Language is fluent with intact comprehension.  Cranial Nerves: Visual acuity is grossly normal. Visual fields are full. Extra-ocular movements intact. No ptosis. Face is symmetric Motor: Tone and bulk are normal. Power is full in both arms and legs. Reflexes are symmetric, no pathologic reflexes present.  Sensory: Intact to light touch Gait: Normal.   Labs: I have reviewed the data as listed    Component Value Date/Time   NA 145 03/14/2021 1124   NA 143 02/13/2020 1037   K 4.1 03/14/2021 1124   CL 108 03/14/2021 1124   CO2 27 03/14/2021  1124   GLUCOSE 106 (H) 03/14/2021 1124   BUN 14 03/14/2021 1124   BUN 13 02/13/2020 1037   CREATININE 1.03 03/14/2021 1124   CALCIUM 9.4 03/14/2021 1124   PROT 7.2 03/14/2021 1124   PROT 6.7 02/13/2020 1037   ALBUMIN 4.1 03/14/2021 1124   ALBUMIN 4.3 02/13/2020 1037   AST 18 03/14/2021 1124   ALT 13 03/14/2021 1124   ALKPHOS 94 03/14/2021 1124   BILITOT 0.5 03/14/2021 1124   GFRNONAA >60 03/14/2021 2831   GFRAA DUPLICATE 51/76/1607 3710   Lab Results  Component Value Date   WBC 5.0 04/25/2021   NEUTROABS 3.0 04/25/2021   HGB 13.9 04/25/2021   HCT 41.1 04/25/2021   MCV 88.2 04/25/2021   PLT 192 04/25/2021    Assessment/Plan Frontal glioblastoma multiforme (HCC) [C71.1]   Mathew Leach is clinically stable today, now having completed 4 cycles of adjuvant Temodar.  Labs are within normal limits today.  We recommended continuing treatment with cycle #5 Temozolomide, dose increased to 200 mg/m2, on for five days and off for twenty three days in twenty eight day cycles. The patient will have a complete blood count performed on days 21 and 28 of each cycle, and a comprehensive metabolic panel performed on day 28 of each cycle. Labs may need to be performed more often. Zofran will prescribed for home use for nausea/vomiting.   Chemotherapy should be held for the following:  ANC less than 1,000  Platelets less than 100,000  LFT or creatinine greater than 2x ULN  If clinical concerns/contraindications develop  Keppra should maintain at 1079m BID for now.  Will con't to follow with urology regarding likely renal cell carcinoma, surgical candidacy.  We ask that DGardenia Phlegmreturn to clinic in 1 month with labs for evaluation prior to cycle #6.  All questions were answered. The patient knows to call the clinic with any problems, questions or concerns. No barriers to learning were detected.  I have spent a total of 30 minutes of face-to-face and non-face-to-face  time, excluding clinical staff time, preparing to see patient, ordering tests and/or medications, counseling the patient, and independently interpreting results and communicating results to the patient/family/caregiver    ZVentura Sellers MD Medical Director of Neuro-Oncology [facility]06/09/22 11:58 AM

## 2021-04-26 ENCOUNTER — Telehealth: Payer: Self-pay

## 2021-04-26 ENCOUNTER — Other Ambulatory Visit (HOSPITAL_COMMUNITY): Payer: Self-pay

## 2021-04-29 ENCOUNTER — Other Ambulatory Visit (HOSPITAL_COMMUNITY): Payer: Self-pay

## 2021-04-29 DIAGNOSIS — D49511 Neoplasm of unspecified behavior of right kidney: Secondary | ICD-10-CM | POA: Diagnosis not present

## 2021-05-14 ENCOUNTER — Other Ambulatory Visit (HOSPITAL_COMMUNITY): Payer: Self-pay

## 2021-05-21 ENCOUNTER — Other Ambulatory Visit (HOSPITAL_COMMUNITY): Payer: Self-pay

## 2021-05-21 ENCOUNTER — Inpatient Hospital Stay: Payer: Medicare Other | Attending: Internal Medicine

## 2021-05-21 ENCOUNTER — Other Ambulatory Visit: Payer: Self-pay

## 2021-05-21 ENCOUNTER — Inpatient Hospital Stay (HOSPITAL_BASED_OUTPATIENT_CLINIC_OR_DEPARTMENT_OTHER): Payer: Medicare Other | Admitting: Internal Medicine

## 2021-05-21 ENCOUNTER — Other Ambulatory Visit: Payer: Self-pay | Admitting: Internal Medicine

## 2021-05-21 VITALS — BP 116/60 | HR 77 | Temp 97.5°F | Resp 19 | Ht 67.0 in | Wt 155.4 lb

## 2021-05-21 DIAGNOSIS — C711 Malignant neoplasm of frontal lobe: Secondary | ICD-10-CM | POA: Diagnosis not present

## 2021-05-21 DIAGNOSIS — Z79899 Other long term (current) drug therapy: Secondary | ICD-10-CM | POA: Diagnosis not present

## 2021-05-21 DIAGNOSIS — R569 Unspecified convulsions: Secondary | ICD-10-CM

## 2021-05-21 DIAGNOSIS — Z923 Personal history of irradiation: Secondary | ICD-10-CM | POA: Insufficient documentation

## 2021-05-21 DIAGNOSIS — N281 Cyst of kidney, acquired: Secondary | ICD-10-CM | POA: Diagnosis not present

## 2021-05-21 LAB — CBC WITH DIFFERENTIAL (CANCER CENTER ONLY)
Abs Immature Granulocytes: 0.01 10*3/uL (ref 0.00–0.07)
Basophils Absolute: 0.1 10*3/uL (ref 0.0–0.1)
Basophils Relative: 1 %
Eosinophils Absolute: 0.2 10*3/uL (ref 0.0–0.5)
Eosinophils Relative: 5 %
HCT: 43.2 % (ref 39.0–52.0)
Hemoglobin: 14.5 g/dL (ref 13.0–17.0)
Immature Granulocytes: 0 %
Lymphocytes Relative: 21 %
Lymphs Abs: 1 10*3/uL (ref 0.7–4.0)
MCH: 30.3 pg (ref 26.0–34.0)
MCHC: 33.6 g/dL (ref 30.0–36.0)
MCV: 90.4 fL (ref 80.0–100.0)
Monocytes Absolute: 0.5 10*3/uL (ref 0.1–1.0)
Monocytes Relative: 10 %
Neutro Abs: 3 10*3/uL (ref 1.7–7.7)
Neutrophils Relative %: 63 %
Platelet Count: 246 10*3/uL (ref 150–400)
RBC: 4.78 MIL/uL (ref 4.22–5.81)
RDW: 14 % (ref 11.5–15.5)
WBC Count: 4.7 10*3/uL (ref 4.0–10.5)
nRBC: 0 % (ref 0.0–0.2)

## 2021-05-21 LAB — CMP (CANCER CENTER ONLY)
ALT: 19 U/L (ref 0–44)
AST: 19 U/L (ref 15–41)
Albumin: 3.8 g/dL (ref 3.5–5.0)
Alkaline Phosphatase: 82 U/L (ref 38–126)
Anion gap: 7 (ref 5–15)
BUN: 15 mg/dL (ref 8–23)
CO2: 28 mmol/L (ref 22–32)
Calcium: 9.2 mg/dL (ref 8.9–10.3)
Chloride: 109 mmol/L (ref 98–111)
Creatinine: 1.2 mg/dL (ref 0.61–1.24)
GFR, Estimated: >60 mL/min (ref >60)
Glucose, Bld: 120 mg/dL — ABNORMAL HIGH (ref 70–99)
Potassium: 4 mmol/L (ref 3.5–5.1)
Sodium: 144 mmol/L (ref 135–145)
Total Bilirubin: 0.6 mg/dL (ref 0.3–1.2)
Total Protein: 6.8 g/dL (ref 6.5–8.1)

## 2021-05-21 MED ORDER — TEMOZOLOMIDE 180 MG PO CAPS
200.0000 mg/m2/d | ORAL_CAPSULE | Freq: Every day | ORAL | 0 refills | Status: AC
  Filled 2021-05-21: qty 10, 5d supply, fill #0

## 2021-05-21 NOTE — Progress Notes (Signed)
Mathew Leach at [facility], [place] [phone]   Interval Evaluation  Date of Service: 05/21/21 Patient Name: Mathew Leach Patient MRN: [id number] Patient DOB: 28-Aug-1951 Provider: Ventura Sellers, MD  Identifying Statement:  Mathew Leach is a 70 y.o. male with left frontal glioblastoma   Referring Provider:  Oncologic History: Oncology History  Frontal glioblastoma multiforme ([place])  09/18/2020 Surgery   Left frontal craniotomy, resection by Dr. Kathyrn Sheriff; path demonstrates Glioblastaoma IDH-wt   10/17/2020 - 10/17/2020 Chemotherapy    Patient is on Treatment Plan: BRAIN GLIOBLASTOMA POST XRT TEMOZOLOMIDE DAYS 1-5 Q28 DAYS        12/10/2020 -  Chemotherapy    Patient is on Treatment Plan: BRAIN GLIOBLASTOMA POST XRT TEMOZOLOMIDE DAYS 1-5 Q28 DAYS          Biomarkers:  MGMT Unknown.  IDH 1/2 Wild type.  EGFR Unknown  TERT Unknown   Interval History: Mathew Leach presents to clinic today, now having completed 5 cycles of 5-day adjuvant Temozolomide.  Denies new or progressive deficits. Otherwise remains independent with gait, basic ADLs.  Is meeting with urology later this month to plan laparoscopic nephrectomy. Denies further seizures, no headaches.  H+P (10/04/20)  Mathew Leach presented to medical attention three weeks ago with new onset generalized seizure.  This occurred at work, unwitnessed, and then second event was witnessed by EMS described as generalized shaking.  CNS imaging demonstrated left frontal mass lesion, which was resected by Dr. Kathyrn Sheriff on 09/18/20.  Following surgery, he had no clinical complaints.  Workup did also demonstrate mass within kidney possibly c/w renal cell carcinoma.  He presents today for path review; he is fully independent for age, lives with his wife.   Medications: Current Outpatient Medications on File Prior to Visit  Medication Sig Dispense  Refill   amLODipine-benazepril (LOTREL) 10-20 MG capsule Take 1 capsule by mouth daily.     levETIRAcetam (KEPPRA) 1000 MG tablet Take 1 tablet (1,000 mg total) by mouth 2 (two) times daily. 60 tablet 0   ondansetron (ZOFRAN) 8 MG tablet TAKE 1 TABLET BY MOUTH 2 TIMES DAILY AS NEEDED NAUSEA AND VOMITING MAY TAKE 30-60 MINS PRIOR TO TEMODAR ADMINISTRATION IF NAUSEA/VOMITING OCCURS (Patient not taking: Reported on 03/14/2021) 30 tablet 1   PRESCRIPTION MEDICATION See admin instructions. CPAP- At bedtime     simvastatin (ZOCOR) 20 MG tablet Take 1 tablet (20 mg total) by mouth daily. (Patient taking differently: Take 20 mg by mouth daily with lunch.) 90 tablet 3   temozolomide (TEMODAR) 180 MG capsule TAKE 2 CAPSULES (360 MG TOTAL) BY MOUTH DAILY FOR 5 DAYS. MAY TAKE ON AN EMPTY STOMACH TO DECREASE NAUSEA & VOMITING. (Patient not taking: Reported on 03/14/2021) 10 capsule 0   temozolomide (TEMODAR) 180 MG capsule Take 2 capsules (360 mg total) by mouth daily for 5 days. May take on an empty stomach to decrease nausea & vomiting. 10 capsule 0   No current facility-administered medications on file prior to visit.    Allergies:  Allergies  Allergen Reactions   Lipitor [Atorvastatin] Other (See Comments)    Caused DOUBLE VISION    Past Medical History:  Past Medical History:  Diagnosis Date   Actinic keratosis    scalp   Arthritis    ASD (atrial septal defect) 06/10/2015   Atrial flutter, paroxysmal ([place]) 08/01/2015   Chronic anticoagulation 08/29/2015   Chronic pain of right knee 12/07/2014  Dysplastic nevus 01/11/2015   upper back spinal   Dysplastic nevus 01/29/2017   right superior calf inferior to popliteal   Dysrhythmia    AFIB   Goals of care, counseling/discussion 09/17/2020   Hypertension    Kidney stones    Obstructive sleep apnea syndrome 06/26/2015   PAF (paroxysmal atrial fibrillation) ([place]) 07/30/2015   Pericarditis    1999   PFO (patent foramen ovale) 06/16/2018    Preoperative cardiovascular examination 02/03/2016   S/P TKR (total knee replacement) using cement, 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   Shortness of breath dyspnea    W/ EXERTION    Sleep apnea    CPAP   Stroke ([place])    04-2015   Past Surgical History:  Past Surgical History:  Procedure Laterality Date   APPLICATION OF CRANIAL NAVIGATION Left 09/18/2020   Procedure: APPLICATION OF CRANIAL NAVIGATION;  Surgeon: Consuella Lose, MD;  Location: [facility];  Service: Neurosurgery;  Laterality: Left;   COLONOSCOPY  04/10/2011   Moderate predominantly sigmoid diverticulosis. Small internal hemorrhoids.    COLONOSCOPY  01/30/2020   CRANIOTOMY Left 09/18/2020   Procedure: LEFT FRONTAL CRANIOTOMY FOR  TUMOR;  Surgeon: Consuella Lose, MD;  Location: [facility];  Service: Neurosurgery;  Laterality: Left;  left   CYST REMOVAL NECK     AGE 34   ESOPHAGOGASTRODUODENOSCOPY  04/10/2011   Erosive esophagitis with esophageal stricture (asymptomatic strictures since the patient not having any dysphagia). LA grade D esophagitis.   INGUINAL HERNIA REPAIR     LEFT AS CHILD   TOTAL KNEE ARTHROPLASTY Right 03/17/2016   Procedure: TOTAL KNEE ARTHROPLASTY;  Surgeon: [name], MD;  Location: [facility];  Service: Orthopedics;  Laterality: Right;   watchman procedure  07/2019   Social History:  Social History   Socioeconomic History   Marital status: Married    Spouse name: Not on file   Number of children: Not on file   Years of education: Not on file   Highest education level: Not on file  Occupational History   Not on file  Tobacco Use   Smoking status: Never   Smokeless tobacco: Never  Vaping Use   Vaping Use: Never used  Substance and Sexual Activity   Alcohol use: No   Drug use: No   Sexual activity: Not on file  Other Topics Concern   Not on file  Social History Narrative   Not on file   Social Determinants of Health   Financial Resource Strain: Not on file  Food Insecurity: Not on file  Transportation Needs:  Not on file  Physical Activity: Not on file  Stress: Not on file  Social Connections: Not on file  Intimate Partner Violence: Not on file   Family History:  Family History  Problem Relation Age of Onset   Lung cancer Father    Colon cancer Neg Hx    Esophageal cancer Neg Hx    Colon polyps Neg Hx    Rectal cancer Neg Hx    Stomach cancer Neg Hx     Review of Systems: Constitutional: Doesn't report fevers, chills or abnormal weight loss Eyes: Doesn't report blurriness of vision Ears, nose, mouth, throat, and face: Doesn't report sore throat Respiratory: Doesn't report cough, dyspnea or wheezes Cardiovascular: Doesn't report palpitation, chest discomfort  Gastrointestinal:  Doesn't report nausea, constipation, diarrhea GU: Doesn't report incontinence Skin: Doesn't report skin rashes Neurological: Per HPI Musculoskeletal: Doesn't report joint pain Behavioral/Psych: Doesn't report anxiety  Physical Exam: Vitals:   05/21/21 1100  BP: 116/60  Pulse: 77  Resp: 19  Temp: (!) 97.5 F (36.4 C)  SpO2: 98%   KPS: 80. General: Alert, cooperative, pleasant, in no acute distress Head: Normal EENT: No conjunctival injection or scleral icterus.  Lungs: Resp effort normal Cardiac: Regular rate Abdomen: Non-distended abdomen Skin: No rashes cyanosis or petechiae. Extremities: No clubbing or edema  Neurologic Exam: Mental Status: Awake, alert, attentive to examiner. Oriented to self and environment. Language is fluent with intact comprehension.  Cranial Nerves: Visual acuity is grossly normal. Visual fields are full. Extra-ocular movements intact. No ptosis. Face is symmetric Motor: Tone and bulk are normal. Power is full in both arms and legs. Reflexes are symmetric, no pathologic reflexes present.  Sensory: Intact to light touch Gait: Normal.   Labs: I have reviewed the data as listed    Component Value Date/Time   NA 143 04/25/2021 1144   NA 143 02/13/2020 1037   K 4.0  04/25/2021 1144   CL 107 04/25/2021 1144   CO2 27 04/25/2021 1144   GLUCOSE 149 (H) 04/25/2021 1144   BUN 12 04/25/2021 1144   BUN 13 02/13/2020 1037   CREATININE 1.19 04/25/2021 1144   CALCIUM 9.2 04/25/2021 1144   PROT 6.5 04/25/2021 1144   PROT 6.7 02/13/2020 1037   ALBUMIN 3.7 04/25/2021 1144   ALBUMIN 4.3 02/13/2020 1037   AST 14 (L) 04/25/2021 1144   ALT 14 04/25/2021 1144   ALKPHOS 80 04/25/2021 1144   BILITOT 0.6 04/25/2021 1144   GFRNONAA >60 04/25/2021 2637   GFRAA DUPLICATE 85/88/5027 7412   Lab Results  Component Value Date   WBC 5.0 04/25/2021   NEUTROABS 3.0 04/25/2021   HGB 13.9 04/25/2021   HCT 41.1 04/25/2021   MCV 88.2 04/25/2021   PLT 192 04/25/2021    Assessment/Plan Frontal glioblastoma multiforme (HCC) [C71.1]   Mathew Leach is clinically stable today, now having completed 5 cycles of adjuvant Temodar.  No new or progressive deficits.  We recommended continuing treatment with cycle #6 Temozolomide at 200 mg/m2, on for five days and off for twenty three days in twenty eight day cycles. The patient will have a complete blood count performed on days 21 and 28 of each cycle, and a comprehensive metabolic panel performed on day 28 of each cycle. Labs may need to be performed more often. Zofran will prescribed for home use for nausea/vomiting.   Chemotherapy should be held for the following:  ANC less than 1,000  Platelets less than 100,000  LFT or creatinine greater than 2x ULN  If clinical concerns/contraindications develop  Keppra should maintain at 1061m BID for now.  Will con't to follow with urology regarding likely renal cell carcinoma, surgical candidacy.  We ask that DGardenia Phlegmreturn to clinic in 1 month with MRI brain for evaluation.  Will defer further chemo if scan is stable.  All questions were answered. The patient knows to call the clinic with any problems, questions or concerns. No barriers to learning were  detected.  I have spent a total of 30 minutes of face-to-face and non-face-to-face time, excluding clinical staff time, preparing to see patient, ordering tests and/or medications, counseling the patient, and independently interpreting results and communicating results to the patient/family/caregiver    ZVentura Sellers MD Medical Director of Neuro-Oncology [facility] Centerat [place] [place]07/05/22 10:52 AM

## 2021-05-22 ENCOUNTER — Other Ambulatory Visit (HOSPITAL_COMMUNITY): Payer: Self-pay

## 2021-05-23 ENCOUNTER — Other Ambulatory Visit: Payer: Medicare Other

## 2021-05-23 ENCOUNTER — Ambulatory Visit: Payer: Medicare Other | Admitting: Internal Medicine

## 2021-05-23 ENCOUNTER — Other Ambulatory Visit (HOSPITAL_COMMUNITY): Payer: Self-pay

## 2021-05-30 ENCOUNTER — Other Ambulatory Visit: Payer: Medicare Other

## 2021-05-30 ENCOUNTER — Ambulatory Visit: Payer: Medicare Other | Admitting: Hematology & Oncology

## 2021-06-05 ENCOUNTER — Other Ambulatory Visit: Payer: Self-pay

## 2021-06-05 ENCOUNTER — Encounter: Payer: Self-pay | Admitting: Hematology & Oncology

## 2021-06-05 ENCOUNTER — Inpatient Hospital Stay (HOSPITAL_BASED_OUTPATIENT_CLINIC_OR_DEPARTMENT_OTHER): Payer: Medicare Other | Admitting: Hematology & Oncology

## 2021-06-05 ENCOUNTER — Inpatient Hospital Stay: Payer: Medicare Other

## 2021-06-05 VITALS — BP 119/68 | HR 69 | Temp 97.9°F | Resp 18 | Wt 155.0 lb

## 2021-06-05 DIAGNOSIS — Z79899 Other long term (current) drug therapy: Secondary | ICD-10-CM | POA: Diagnosis not present

## 2021-06-05 DIAGNOSIS — N281 Cyst of kidney, acquired: Secondary | ICD-10-CM | POA: Diagnosis not present

## 2021-06-05 DIAGNOSIS — Z923 Personal history of irradiation: Secondary | ICD-10-CM | POA: Diagnosis not present

## 2021-06-05 DIAGNOSIS — N2889 Other specified disorders of kidney and ureter: Secondary | ICD-10-CM

## 2021-06-05 DIAGNOSIS — C641 Malignant neoplasm of right kidney, except renal pelvis: Secondary | ICD-10-CM | POA: Diagnosis not present

## 2021-06-05 DIAGNOSIS — N200 Calculus of kidney: Secondary | ICD-10-CM

## 2021-06-05 DIAGNOSIS — C711 Malignant neoplasm of frontal lobe: Secondary | ICD-10-CM | POA: Diagnosis not present

## 2021-06-05 HISTORY — DX: Malignant neoplasm of right kidney, except renal pelvis: C64.1

## 2021-06-05 LAB — CMP (CANCER CENTER ONLY)
ALT: 15 U/L (ref 0–44)
AST: 17 U/L (ref 15–41)
Albumin: 4.2 g/dL (ref 3.5–5.0)
Alkaline Phosphatase: 75 U/L (ref 38–126)
Anion gap: 8 (ref 5–15)
BUN: 18 mg/dL (ref 8–23)
CO2: 28 mmol/L (ref 22–32)
Calcium: 9.6 mg/dL (ref 8.9–10.3)
Chloride: 106 mmol/L (ref 98–111)
Creatinine: 1.15 mg/dL (ref 0.61–1.24)
GFR, Estimated: >60 mL/min (ref >60)
Glucose, Bld: 171 mg/dL — ABNORMAL HIGH (ref 70–99)
Potassium: 4.3 mmol/L (ref 3.5–5.1)
Sodium: 142 mmol/L (ref 135–145)
Total Bilirubin: 0.6 mg/dL (ref 0.3–1.2)
Total Protein: 6.7 g/dL (ref 6.5–8.1)

## 2021-06-05 LAB — CBC WITH DIFFERENTIAL (CANCER CENTER ONLY)
Abs Immature Granulocytes: 0.02 10*3/uL (ref 0.00–0.07)
Basophils Absolute: 0.1 10*3/uL (ref 0.0–0.1)
Basophils Relative: 1 %
Eosinophils Absolute: 0.3 10*3/uL (ref 0.0–0.5)
Eosinophils Relative: 6 %
HCT: 43 % (ref 39.0–52.0)
Hemoglobin: 14.5 g/dL (ref 13.0–17.0)
Immature Granulocytes: 0 %
Lymphocytes Relative: 23 %
Lymphs Abs: 1.1 10*3/uL (ref 0.7–4.0)
MCH: 30.3 pg (ref 26.0–34.0)
MCHC: 33.7 g/dL (ref 30.0–36.0)
MCV: 90 fL (ref 80.0–100.0)
Monocytes Absolute: 0.5 10*3/uL (ref 0.1–1.0)
Monocytes Relative: 10 %
Neutro Abs: 2.8 10*3/uL (ref 1.7–7.7)
Neutrophils Relative %: 60 %
Platelet Count: 217 10*3/uL (ref 150–400)
RBC: 4.78 MIL/uL (ref 4.22–5.81)
RDW: 13.4 % (ref 11.5–15.5)
WBC Count: 4.8 10*3/uL (ref 4.0–10.5)
nRBC: 0 % (ref 0.0–0.2)

## 2021-06-05 LAB — LACTATE DEHYDROGENASE: LDH: 142 U/L (ref 98–192)

## 2021-06-05 NOTE — Progress Notes (Signed)
Hematology and Oncology Follow Up Visit  Mathew Leach [id number] [birth year] 70 y.o. 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  He really looks good.  He is doing quite nicely with respect to his glioblastoma.  Had an MRI of the brain back in May.  Everything looked much better.  He is on Temodar.  Sounds like he will actually completed Temodar after this cycle.  He has had no abdominal pain.  He has had no hematuria.  His appetite is good.  He has had no nausea or vomiting.  He has had no cough or shortness of breath.  He sees Dr. Tresa Moore of [facility] in 2 weeks.  Sounds like Dr. Tresa Moore will be the one who is going to do the laparoscopic resection.  Would like to get another MRI of the kidney so we can see how things look.  I think this would help the urologist.  He has had no fever.  He has had no leg swelling.  He has had no headache.  Overall, I would have to say that his performance status is probably ECOG 1.    Medications:  Current Outpatient Medications:    amLODipine-benazepril (LOTREL) 10-20 MG capsule, Take 1 capsule by mouth daily., Disp: , Rfl:    levETIRAcetam (KEPPRA) 1000 MG tablet, Take 1 tablet (1,000 mg total) by mouth 2 (two) times daily., Disp: 60 tablet, Rfl: 0   ondansetron (ZOFRAN) 8 MG tablet, TAKE 1 TABLET BY MOUTH 2 TIMES DAILY AS NEEDED NAUSEA AND VOMITING MAY TAKE 30-60 MINS PRIOR TO TEMODAR ADMINISTRATION IF NAUSEA/VOMITING OCCURS (Patient not taking: Reported on 05/21/2021), Disp: 30 tablet, Rfl: 1   PRESCRIPTION MEDICATION, See admin instructions. CPAP- At bedtime, Disp: , Rfl:    simvastatin (ZOCOR) 20 MG tablet, Take 1 tablet (20 mg total) by mouth daily. (Patient taking  differently: Take 20 mg by mouth daily with lunch.), Disp: 90 tablet, Rfl: 3   temozolomide (TEMODAR) 180 MG capsule, TAKE 2 CAPSULES (360 MG TOTAL) BY MOUTH DAILY FOR 5 DAYS. MAY TAKE ON AN EMPTY STOMACH TO DECREASE NAUSEA & VOMITING. (Patient not taking: Reported on 05/21/2021), Disp: 10 capsule, Rfl: 0  Allergies:  Allergies  Allergen Reactions   Lipitor [Atorvastatin] Other (See Comments)    Caused DOUBLE VISION     Past Medical History, Surgical history, Social history, and Family History were reviewed and updated.  Review of Systems: Review of Systems  Constitutional: Negative.   HENT:  Negative.    Eyes: Negative.   Respiratory: Negative.    Cardiovascular: Negative.   Gastrointestinal: Negative.   Endocrine: Negative.   Genitourinary: Negative.    Musculoskeletal: Negative.   Skin: Negative.   Neurological: Negative.   Hematological: Negative.   Psychiatric/Behavioral: Negative.     Physical Exam:  vitals were not taken for this visit.   Wt Readings from Last 3 Encounters:  05/21/21 155 lb 6.4 oz (70.5 kg)  04/25/21 151 lb 12.8 oz (68.9 kg)  03/14/21 154 lb 8 oz (70.1 kg)    Physical Exam Vitals reviewed.  HENT:     Head: Normocephalic and atraumatic.     Comments: Head exam shows the craniotomy scar in the left frontoparietal region.  This is well-healed. Eyes:  Pupils: Pupils are equal, round, and reactive to light.  Cardiovascular:     Rate and Rhythm: Normal rate and regular rhythm.     Heart sounds: Normal heart sounds.  Pulmonary:     Effort: Pulmonary effort is normal.     Breath sounds: Normal breath sounds.  Abdominal:     General: Bowel sounds are normal.     Palpations: Abdomen is soft.  Musculoskeletal:        General: No tenderness or deformity. Normal range of motion.     Cervical back: Normal range of motion.  Lymphadenopathy:     Cervical: No cervical adenopathy.  Skin:    General: Skin is warm and dry.     Findings: No erythema or  rash.  Neurological:     Mental Status: He is alert and oriented to person, place, and time.  Psychiatric:        Behavior: Behavior normal.        Thought Content: Thought content normal.        Judgment: Judgment normal.   Lab Results  Component Value Date   WBC 4.8 06/05/2021   HGB 14.5 06/05/2021   HCT 43.0 06/05/2021   MCV 90.0 06/05/2021   PLT 217 06/05/2021     Chemistry      Component Value Date/Time   NA 144 05/21/2021 1045   NA 143 02/13/2020 1037   K 4.0 05/21/2021 1045   CL 109 05/21/2021 1045   CO2 28 05/21/2021 1045   BUN 15 05/21/2021 1045   BUN 13 02/13/2020 1037   CREATININE 1.20 05/21/2021 1045      Component Value Date/Time   CALCIUM 9.2 05/21/2021 1045   ALKPHOS 82 05/21/2021 1045   AST 19 05/21/2021 1045   ALT 19 05/21/2021 1045   BILITOT 0.6 05/21/2021 1045      Impression and Plan: Mathew Leach is a very nice 70 year old white male.  He presented with a glioblastoma of the left frontal lobe.  This was resected.  He underwent adjuvant therapy with radiation and Temodar.  He is now on Temodar alone.  When he presented with a glioblastoma, he also was found to have this renal cyst.  However, is felt that the glioblastoma was much more of a problem.  As such, this was taken care of.  Will be very interesting to see what Dr. Tresa Moore has to say about the renal cyst.  It sounds like this is going to be a malignancy.  Again, we will see about the MRI.  I think this will be very helpful for Dr. Tresa Moore.  I would like to see Mathew Leach back in about 3 months.  By then, he should have had surgery.  I am so glad that he is seeing Dr. Mickeal Skinner of neuro-oncology for his glioblastoma.  Dr. Mickeal Skinner clearly has done a fantastic job in managing the brain tumor.    Volanda Napoleon, MD 7/20/20221:52 PM

## 2021-06-11 ENCOUNTER — Other Ambulatory Visit: Payer: Self-pay | Admitting: Radiation Therapy

## 2021-06-11 IMAGING — MR MR HEAD WO/W CM
16 series · 48 of 48 positions shown · IV contrast (gadavist)
Comparison: 09/19/2020

CLINICAL DATA: Glioblastoma follow-up

EXAM:
MRI HEAD WITHOUT AND WITH CONTRAST
TECHNIQUE: Multiplanar, multiecho pulse sequences of the brain and surrounding
structures were obtained without and with intravenous contrast.
CONTRAST:  7mL GADAVIST GADOBUTROL 1 MMOL/ML IV SOLN

[Series 5: DWI · axial · 3.0mm · 1.36mm/px · z∈[-33,+122]mm · 5 of 108 slices shown (1 of 4)]
[im 1/108]
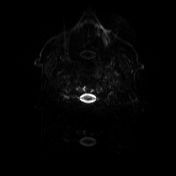
[im 27/108]
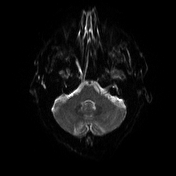
[im 54/108]
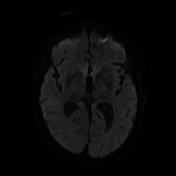
[im 81/108]
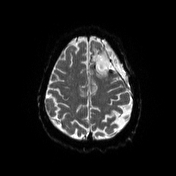
[im 108/108]
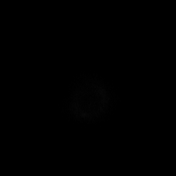

[Series 6: DWI · axial · 3.0mm · 1.36mm/px · z∈[-33,+122]mm · 2 of 54 slices shown (2 of 4)]
[im 1/54]
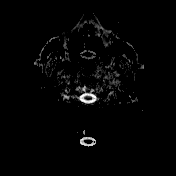
[im 54/54]
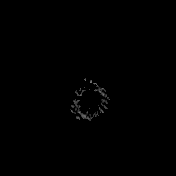

[Series 7: FLAIR · axial · 3.0mm · 0.72mm/px · z∈[-36,+122]mm · 2 of 55 slices shown]
[im 1/55]
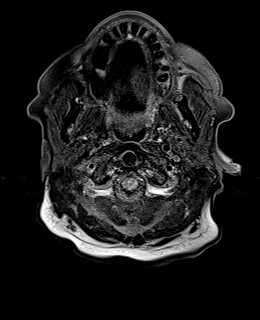
[im 55/55]
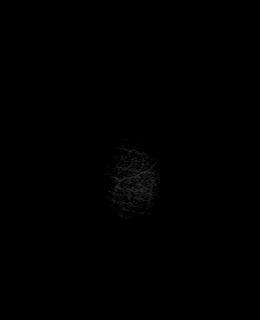

[Series 8: mip_images(sw) · axial · 24.0mm · 0.72mm/px · z∈[-26,+114]mm · 2 of 49 slices shown]
[im 1/49]
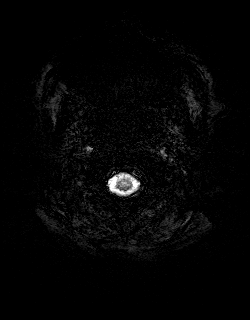
[im 49/49]
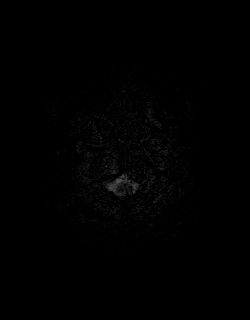

[Series 9: swi_images · axial · 3.0mm · 0.72mm/px · z∈[-36,+124]mm · 2 of 56 slices shown]
[im 1/56]
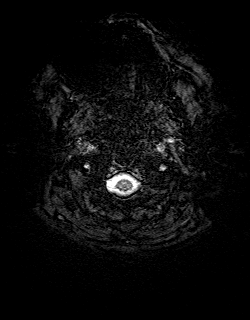
[im 56/56]
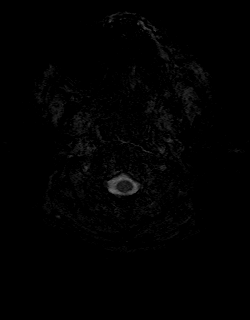

[Series 10: DWI · coronal · 5.0mm · 1.31mm/px · 3 of 76 slices shown (3 of 4)]
[im 1/76]
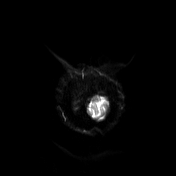
[im 38/76]
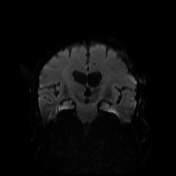
[im 76/76]
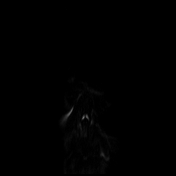

[Series 11: DWI · coronal · 5.0mm · 1.31mm/px · 1 of 38 slices shown (4 of 4)]
[im 1/38]
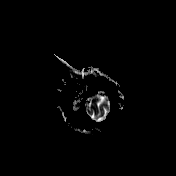

[Series 12: T1 · sagittal · 5.0mm · 0.75mm/px · 1 of 25 slices shown (1 of 2)]
[im 1/25]
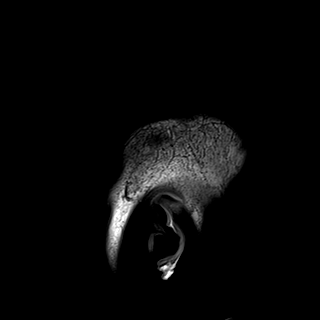

[Series 13: T2 · axial · 5.0mm · 0.60mm/px · 1 of 26 slices shown (1 of 2)]
[im 1/26]
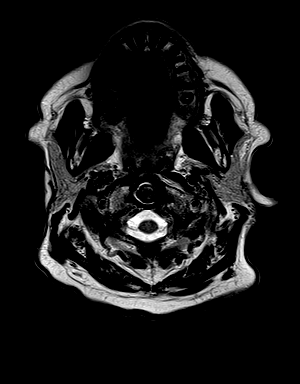

[Series 14: T1 · axial · 1.0mm · 0.90mm/px · z∈[-40,+130]mm · 7 of 176 slices shown (2 of 2)]
[im 1/176]
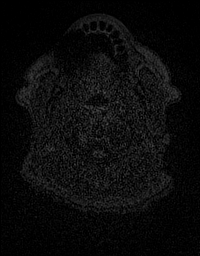
[im 30/176]
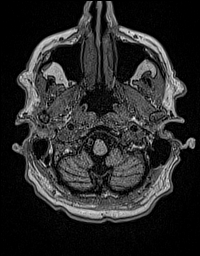
[im 59/176]
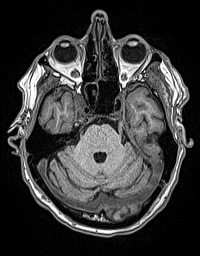
[im 88/176]
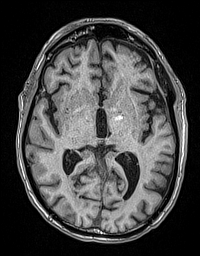
[im 117/176]
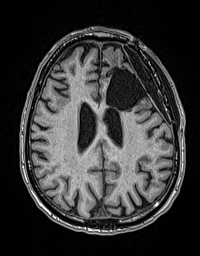
[im 146/176]
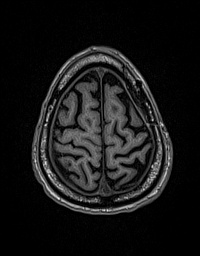
[im 176/176]
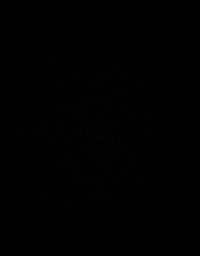

[Series 15: T2 · coronal · 5.0mm · 0.57mm/px · 1 of 29 slices shown (2 of 2)]
[im 1/29]
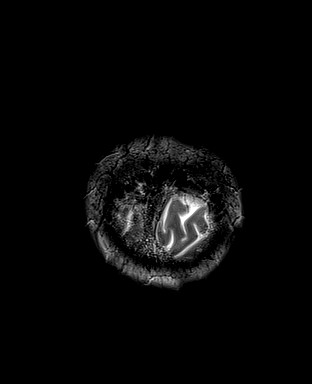

[Series 16: T1 post-contrast · axial · 1.0mm · 0.90mm/px · z∈[-40,+130]mm · 7 of 176 slices shown (1 of 3)]
[im 1/176]
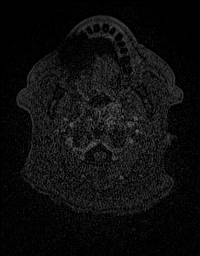
[im 30/176]
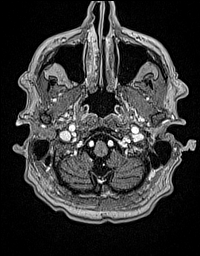
[im 59/176]
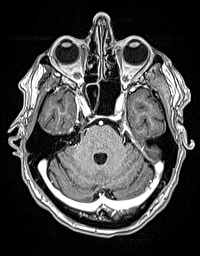
[im 88/176]
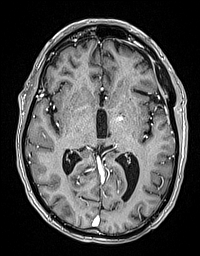
[im 117/176]
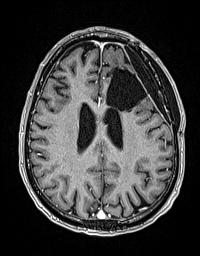
[im 146/176]
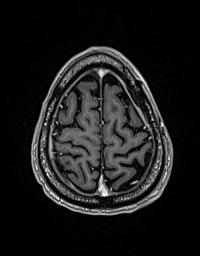
[im 176/176]
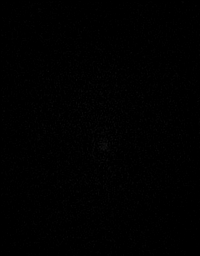

[Series 17: T1 post-contrast · coronal · 5.0mm · 0.43mm/px · 1 of 29 slices shown (2 of 3)]
[im 1/29]
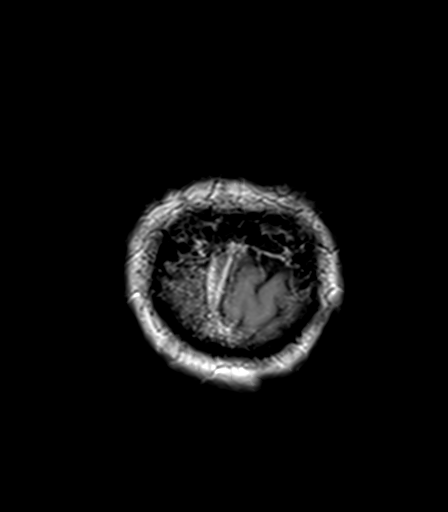

[Series 18: T1 post-contrast · sagittal · 5.0mm · 0.75mm/px · 1 of 25 slices shown (3 of 3)]
[im 1/25]
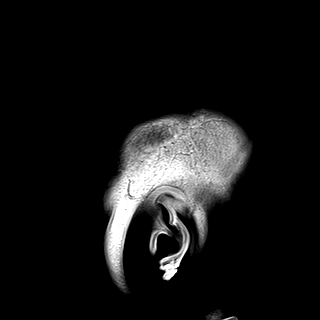

[Series 100: <mpr range> · axial · 1.0mm · 0.48mm/px · z∈[+45,+156]mm · 6 of 168 slices shown]
[im 1/168]
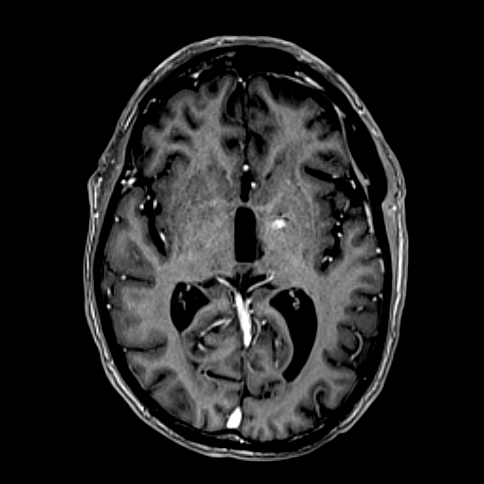
[im 34/168]
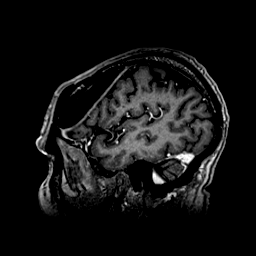
[im 67/168]
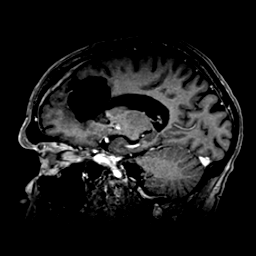
[im 101/168]
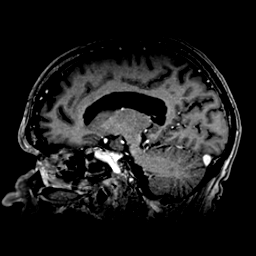
[im 134/168]
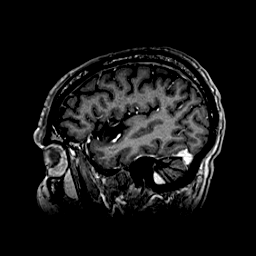
[im 168/168]
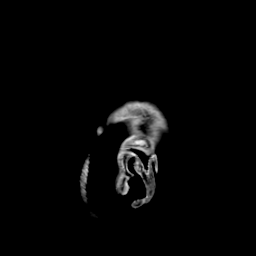

[Series 101: <mpr range(1)> · axial · 1.0mm · 0.48mm/px · z∈[+45,+150]mm · 6 of 168 slices shown]
[im 1/168]
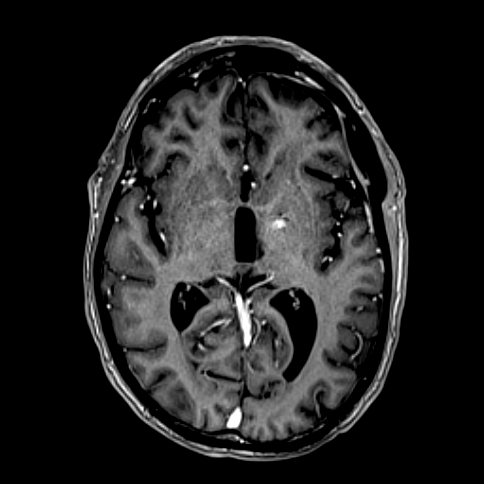
[im 34/168]
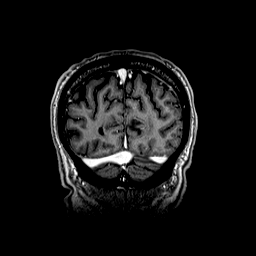
[im 67/168]
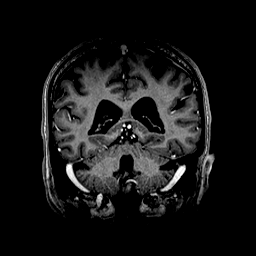
[im 101/168]
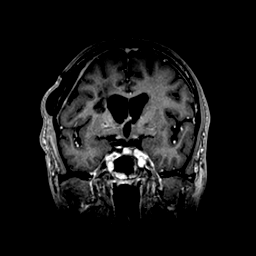
[im 134/168]
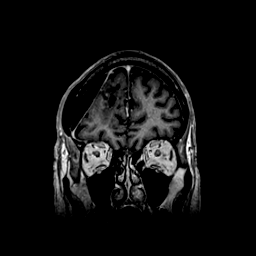
[im 168/168]
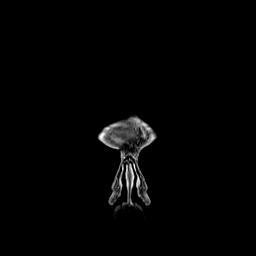

[48 of 48 positions shown; findings below may reference images not displayed]

FINDINGS: Brain: Postoperative changes are identified with resolution of
immediate changes seen on the prior study. There is a persistent
extra-axial and scalp collection along the craniotomy. The
extra-axial component measures 1 cm in thickness. Hemosiderin lined
left frontal resection cavity extends to the left lateral ventricle.
Enhancement at the margins has significantly decreased with no focal
nodularity. Extent of surrounding T2 FLAIR hyperintensity as
decreased reflecting improvement in edema.

Expected evolution of left caudate/internal capsule infarction seen
on the prior study. There is adjacent intrinsic T1 shortening. Mass
effect has improved with decreased ventricle effacement and no
significant midline shift. Mild prominence of the ventricles could
reflect communicating hydrocephalus.

Small chronic left thalamic and bilateral cerebellar infarcts.
Chronic subarachnoid blood products are noted within the lateral
ventricles, about the brainstem, and cerebellar folia. There is no
acute infarction.

Vascular: Major vessel flow voids at the skull base are preserved.

Skull and upper cervical spine: Normal marrow signal is preserved.

Sinuses/Orbits: Minor mucosal thickening.  Orbits are unremarkable.

Other: Sella is unremarkable.  Mastoid air cells are clear.
IMPRESSION: Evolution of postoperative changes without evidence of
residual/recurrent enhancing tumor. Decreased surrounding edema and
improved mass effect.

Mild prominence of the ventricles, which could reflect communicating
hydrocephalus, noting presence of chronic postoperative subarachnoid
blood products.

Expected evolution of left caudate/internal capsule infarction.

## 2021-06-14 ENCOUNTER — Other Ambulatory Visit (HOSPITAL_COMMUNITY): Payer: Self-pay

## 2021-06-17 ENCOUNTER — Encounter: Payer: Self-pay | Admitting: Internal Medicine

## 2021-06-17 ENCOUNTER — Ambulatory Visit (HOSPITAL_COMMUNITY)
Admission: RE | Admit: 2021-06-17 | Discharge: 2021-06-17 | Disposition: A | Discharge status: Home / Self Care | Payer: Medicare Other | Source: Ambulatory Visit | Attending: Hematology & Oncology | Admitting: Hematology & Oncology

## 2021-06-17 ENCOUNTER — Other Ambulatory Visit: Payer: Self-pay

## 2021-06-17 DIAGNOSIS — N281 Cyst of kidney, acquired: Secondary | ICD-10-CM | POA: Diagnosis not present

## 2021-06-17 DIAGNOSIS — K7689 Other specified diseases of liver: Secondary | ICD-10-CM | POA: Diagnosis not present

## 2021-06-17 DIAGNOSIS — N2889 Other specified disorders of kidney and ureter: Secondary | ICD-10-CM | POA: Diagnosis not present

## 2021-06-17 MED ORDER — GADOBUTROL 1 MMOL/ML IV SOLN
7.0000 mL | Freq: Once | INTRAVENOUS | Status: AC | PRN
  Administered 2021-06-17: 7 mL via INTRAVENOUS

## 2021-06-18 ENCOUNTER — Other Ambulatory Visit (HOSPITAL_COMMUNITY): Payer: Self-pay

## 2021-06-18 DIAGNOSIS — Q612 Polycystic kidney, adult type: Secondary | ICD-10-CM | POA: Diagnosis not present

## 2021-06-18 DIAGNOSIS — D49511 Neoplasm of unspecified behavior of right kidney: Secondary | ICD-10-CM | POA: Diagnosis not present

## 2021-06-21 ENCOUNTER — Ambulatory Visit (HOSPITAL_COMMUNITY)
Admission: RE | Admit: 2021-06-21 | Discharge: 2021-06-21 | Disposition: A | Discharge status: Home / Self Care | Payer: Medicare Other | Source: Ambulatory Visit | Attending: Internal Medicine | Admitting: Internal Medicine

## 2021-06-21 ENCOUNTER — Other Ambulatory Visit: Payer: Self-pay

## 2021-06-21 DIAGNOSIS — C711 Malignant neoplasm of frontal lobe: Secondary | ICD-10-CM | POA: Insufficient documentation

## 2021-06-21 DIAGNOSIS — I639 Cerebral infarction, unspecified: Secondary | ICD-10-CM | POA: Diagnosis not present

## 2021-06-21 DIAGNOSIS — Z9889 Other specified postprocedural states: Secondary | ICD-10-CM | POA: Diagnosis not present

## 2021-06-21 DIAGNOSIS — C719 Malignant neoplasm of brain, unspecified: Secondary | ICD-10-CM | POA: Diagnosis not present

## 2021-06-21 MED ORDER — GADOBUTROL 1 MMOL/ML IV SOLN
7.0000 mL | Freq: Once | INTRAVENOUS | Status: AC | PRN
  Administered 2021-06-21: 7 mL via INTRAVENOUS

## 2021-06-24 ENCOUNTER — Inpatient Hospital Stay: Payer: Medicare Other | Attending: Internal Medicine

## 2021-06-24 DIAGNOSIS — C711 Malignant neoplasm of frontal lobe: Secondary | ICD-10-CM | POA: Insufficient documentation

## 2021-06-25 ENCOUNTER — Inpatient Hospital Stay: Payer: Medicare Other

## 2021-06-25 ENCOUNTER — Inpatient Hospital Stay (HOSPITAL_BASED_OUTPATIENT_CLINIC_OR_DEPARTMENT_OTHER): Payer: Medicare Other | Admitting: Internal Medicine

## 2021-06-25 ENCOUNTER — Other Ambulatory Visit (HOSPITAL_COMMUNITY): Payer: Self-pay

## 2021-06-25 ENCOUNTER — Other Ambulatory Visit: Payer: Self-pay

## 2021-06-25 VITALS — BP 124/72 | HR 71 | Temp 97.7°F | Resp 17 | Wt 158.2 lb

## 2021-06-25 DIAGNOSIS — C711 Malignant neoplasm of frontal lobe: Secondary | ICD-10-CM

## 2021-06-25 DIAGNOSIS — R569 Unspecified convulsions: Secondary | ICD-10-CM

## 2021-06-25 LAB — CBC WITH DIFFERENTIAL (CANCER CENTER ONLY)
Abs Immature Granulocytes: 0.01 10*3/uL (ref 0.00–0.07)
Basophils Absolute: 0.1 10*3/uL (ref 0.0–0.1)
Basophils Relative: 1 %
Eosinophils Absolute: 0.2 10*3/uL (ref 0.0–0.5)
Eosinophils Relative: 4 %
HCT: 43.8 % (ref 39.0–52.0)
Hemoglobin: 14.7 g/dL (ref 13.0–17.0)
Immature Granulocytes: 0 %
Lymphocytes Relative: 24 %
Lymphs Abs: 1 10*3/uL (ref 0.7–4.0)
MCH: 30.3 pg (ref 26.0–34.0)
MCHC: 33.6 g/dL (ref 30.0–36.0)
MCV: 90.3 fL (ref 80.0–100.0)
Monocytes Absolute: 0.4 10*3/uL (ref 0.1–1.0)
Monocytes Relative: 10 %
Neutro Abs: 2.5 10*3/uL (ref 1.7–7.7)
Neutrophils Relative %: 61 %
Platelet Count: 211 10*3/uL (ref 150–400)
RBC: 4.85 MIL/uL (ref 4.22–5.81)
RDW: 13.2 % (ref 11.5–15.5)
WBC Count: 4.1 10*3/uL (ref 4.0–10.5)
nRBC: 0 % (ref 0.0–0.2)

## 2021-06-25 LAB — CMP (CANCER CENTER ONLY)
ALT: 16 U/L (ref 0–44)
AST: 19 U/L (ref 15–41)
Albumin: 4 g/dL (ref 3.5–5.0)
Alkaline Phosphatase: 84 U/L (ref 38–126)
Anion gap: 8 (ref 5–15)
BUN: 12 mg/dL (ref 8–23)
CO2: 28 mmol/L (ref 22–32)
Calcium: 9.6 mg/dL (ref 8.9–10.3)
Chloride: 107 mmol/L (ref 98–111)
Creatinine: 1.15 mg/dL (ref 0.61–1.24)
GFR, Estimated: >60 mL/min (ref >60)
Glucose, Bld: 108 mg/dL — ABNORMAL HIGH (ref 70–99)
Potassium: 3.9 mmol/L (ref 3.5–5.1)
Sodium: 143 mmol/L (ref 135–145)
Total Bilirubin: 0.4 mg/dL (ref 0.3–1.2)
Total Protein: 6.8 g/dL (ref 6.5–8.1)

## 2021-06-25 NOTE — Progress Notes (Signed)
[facility] at [place],  [id number] [phone]   Interval Evaluation  Date of Service: 06/25/21 Patient Name: Mathew Leach Patient MRN: [id number] Patient DOB: January 05, 1951 Provider: Ventura Sellers, MD  Identifying Statement:  Mathew Leach is a 70 y.o. male with left frontal glioblastoma   Referring Provider:  Oncologic History: Oncology History  Frontal glioblastoma multiforme ([place])  09/18/2020 Surgery   Left frontal craniotomy, resection by Dr. Kathyrn Sheriff; path demonstrates Glioblastaoma IDH-wt   10/17/2020 - 10/17/2020 Chemotherapy    Patient is on Treatment Plan: BRAIN GLIOBLASTOMA POST XRT TEMOZOLOMIDE DAYS 1-5 Q28 DAYS        12/10/2020 -  Chemotherapy    Patient is on Treatment Plan: BRAIN GLIOBLASTOMA POST XRT TEMOZOLOMIDE DAYS 1-5 Q28 DAYS          Biomarkers:  MGMT Unknown.  IDH 1/2 Wild type.  EGFR Unknown  TERT Unknown   Interval History: Rahmir Beever presents to clinic today, now having completed 6 cycles of 5-day adjuvant Temozolomide.  No issues tolerating chemotherapy.  Denies new or progressive deficits. Otherwise remains independent with gait, basic ADLs.  Still unclear on plans for resection of suspected renal cell carcinoma.  Denies further seizures, no headaches.  H+P (10/04/20)  [name] presented to medical attention three weeks ago with new onset generalized seizure.  This occurred at work, unwitnessed, and then second event was witnessed by EMS described as generalized shaking.  CNS imaging demonstrated left frontal mass lesion, which was resected by Dr. Kathyrn Sheriff on 09/18/20.  Following surgery, he had no clinical complaints.  Workup did also demonstrate mass within kidney possibly c/w renal cell carcinoma.  He presents today for path review; he is fully independent for age, lives with his wife.   Medications: Current Outpatient Medications on File Prior to  Visit  Medication Sig Dispense Refill   amLODipine-benazepril (LOTREL) 10-20 MG capsule Take 1 capsule by mouth daily.     levETIRAcetam (KEPPRA) 1000 MG tablet Take 1 tablet (1,000 mg total) by mouth 2 (two) times daily. 60 tablet 0   PRESCRIPTION MEDICATION See admin instructions. CPAP- At bedtime     simvastatin (ZOCOR) 20 MG tablet Take 1 tablet (20 mg total) by mouth daily. (Patient taking differently: Take 20 mg by mouth daily with lunch.) 90 tablet 3   ondansetron (ZOFRAN) 8 MG tablet TAKE 1 TABLET BY MOUTH 2 TIMES DAILY AS NEEDED NAUSEA AND VOMITING MAY TAKE 30-60 MINS PRIOR TO TEMODAR ADMINISTRATION IF NAUSEA/VOMITING OCCURS (Patient not taking: No sig reported) 30 tablet 1   temozolomide (TEMODAR) 180 MG capsule TAKE 2 CAPSULES (360 MG TOTAL) BY MOUTH DAILY FOR 5 DAYS. MAY TAKE ON AN EMPTY STOMACH TO DECREASE NAUSEA & VOMITING. (Patient not taking: No sig reported) 10 capsule 0   No current facility-administered medications on file prior to visit.    Allergies:  Allergies  Allergen Reactions   Lipitor [Atorvastatin] Other (See Comments)    Caused DOUBLE VISION    Past Medical History:  Past Medical History:  Diagnosis Date   Actinic keratosis    scalp   Arthritis    ASD (atrial septal defect) 06/10/2015   Atrial flutter, paroxysmal ([place]) 08/01/2015   Cancer of right kidney ([place]) 06/05/2021   Chronic anticoagulation 08/29/2015   Chronic pain of right knee 12/07/2014   Dysplastic nevus 01/11/2015   upper back spinal   Dysplastic nevus 01/29/2017   right superior calf inferior  to popliteal   Dysrhythmia    AFIB   Goals of care, counseling/discussion 09/17/2020   Hypertension    Kidney stones    Obstructive sleep apnea syndrome 06/26/2015   PAF (paroxysmal atrial fibrillation) ([place]) 07/30/2015   Pericarditis    1999   PFO (patent foramen ovale) 06/16/2018   Preoperative cardiovascular examination 02/03/2016   S/P TKR (total knee replacement) using cement, 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    Shortness of breath dyspnea    W/ EXERTION    Sleep apnea    CPAP   Stroke ([place])    04-2015   Past Surgical History:  Past Surgical History:  Procedure Laterality Date   APPLICATION OF CRANIAL NAVIGATION Left 09/18/2020   Procedure: APPLICATION OF CRANIAL NAVIGATION;  Surgeon: Consuella Lose, MD;  Location: [facility];  Service: Neurosurgery;  Laterality: Left;   COLONOSCOPY  04/10/2011   Moderate predominantly sigmoid diverticulosis. Small internal hemorrhoids.    COLONOSCOPY  01/30/2020   CRANIOTOMY Left 09/18/2020   Procedure: LEFT FRONTAL CRANIOTOMY FOR  TUMOR;  Surgeon: Consuella Lose, MD;  Location: [facility];  Service: Neurosurgery;  Laterality: Left;  left   CYST REMOVAL NECK     AGE 59   ESOPHAGOGASTRODUODENOSCOPY  04/10/2011   Erosive esophagitis with esophageal stricture (asymptomatic strictures since the patient not having any dysphagia). LA grade D esophagitis.   INGUINAL HERNIA REPAIR     LEFT AS CHILD   TOTAL KNEE ARTHROPLASTY Right 03/17/2016   Procedure: TOTAL KNEE ARTHROPLASTY;  Surgeon: [name], MD;  Location: [facility];  Service: Orthopedics;  Laterality: Right;   watchman procedure  07/2019   Social History:  Social History   Socioeconomic History   Marital status: Married    Spouse name: Not on file   Number of children: Not on file   Years of education: Not on file   Highest education level: Not on file  Occupational History   Not on file  Tobacco Use   Smoking status: Never   Smokeless tobacco: Never  Vaping Use   Vaping Use: Never used  Substance and Sexual Activity   Alcohol use: No   Drug use: No   Sexual activity: Not on file  Other Topics Concern   Not on file  Social History Narrative   Not on file   Social Determinants of Health   Financial Resource Strain: Not on file  Food Insecurity: Not on file  Transportation Needs: Not on file  Physical Activity: Not on file  Stress: Not on file  Social Connections: Not on file  Intimate  Partner Violence: Not on file   Family History:  Family History  Problem Relation Age of Onset   Lung cancer Father    Colon cancer Neg Hx    Esophageal cancer Neg Hx    Colon polyps Neg Hx    Rectal cancer Neg Hx    Stomach cancer Neg Hx     Review of Systems: Constitutional: Doesn't report fevers, chills or abnormal weight loss Eyes: Doesn't report blurriness of vision Ears, nose, mouth, throat, and face: Doesn't report sore throat Respiratory: Doesn't report cough, dyspnea or wheezes Cardiovascular: Doesn't report palpitation, chest discomfort  Gastrointestinal:  Doesn't report nausea, constipation, diarrhea GU: Doesn't report incontinence Skin: Doesn't report skin rashes Neurological: Per HPI Musculoskeletal: Doesn't report joint pain Behavioral/Psych: Doesn't report anxiety  Physical Exam: Vitals:   06/25/21 1144  BP: 124/72  Pulse: 71  Resp: 17  Temp: 97.7 F (36.5 C)  SpO2: 98%  KPS: 80. General: Alert, cooperative, pleasant, in no acute distress Head: Normal EENT: No conjunctival injection or scleral icterus.  Lungs: Resp effort normal Cardiac: Regular rate Abdomen: Non-distended abdomen Skin: No rashes cyanosis or petechiae. Extremities: No clubbing or edema  Neurologic Exam: Mental Status: Awake, alert, attentive to examiner. Oriented to self and environment. Language is fluent with intact comprehension.  Cranial Nerves: Visual acuity is grossly normal. Visual fields are full. Extra-ocular movements intact. No ptosis. Face is symmetric Motor: Tone and bulk are normal. Power is full in both arms and legs. Reflexes are symmetric, no pathologic reflexes present.  Sensory: Intact to light touch Gait: Normal.   Labs: I have reviewed the data as listed    Component Value Date/Time   NA 143 06/25/2021 1117   NA 143 02/13/2020 1037   K 3.9 06/25/2021 1117   CL 107 06/25/2021 1117   CO2 28 06/25/2021 1117   GLUCOSE 108 (H) 06/25/2021 1117   BUN 12  06/25/2021 1117   BUN 13 02/13/2020 1037   CREATININE 1.15 06/25/2021 1117   CALCIUM 9.6 06/25/2021 1117   PROT 6.8 06/25/2021 1117   PROT 6.7 02/13/2020 1037   ALBUMIN 4.0 06/25/2021 1117   ALBUMIN 4.3 02/13/2020 1037   AST 19 06/25/2021 1117   ALT 16 06/25/2021 1117   ALKPHOS 84 06/25/2021 1117   BILITOT 0.4 06/25/2021 1117   GFRNONAA >60 06/25/2021 0076   GFRAA DUPLICATE 22/63/3354 5625   Lab Results  Component Value Date   WBC 4.1 06/25/2021   NEUTROABS 2.5 06/25/2021   HGB 14.7 06/25/2021   HCT 43.8 06/25/2021   MCV 90.3 06/25/2021   PLT 211 06/25/2021   Imaging:  [place] Clinician Interpretation: I have personally reviewed the CNS images as listed.  My interpretation, in the context of the patient's clinical presentation, is stable disease  MR BRAIN W WO CONTRAST  Result Date: 06/21/2021 CLINICAL DATA:  Brain/CNS neoplasm, assess treatment response; glioblastoma EXAM: MRI HEAD WITHOUT AND WITH CONTRAST TECHNIQUE: Multiplanar, multiecho pulse sequences of the brain and surrounding structures were obtained without and with intravenous contrast. CONTRAST:  41m GADAVIST GADOBUTROL 1 MMOL/ML IV SOLN COMPARISON:  04/05/2021 FINDINGS: Brain: Left frontal resection cavity is again identified. Minimal enhancement remains without nodularity or progression. Stable extent of surrounding T2 FLAIR hyperintensity. Ventricles are stable in size. Unchanged extra-axial collection underlying the craniotomy. Chronic postoperative infarct of left basal ganglia and adjacent white matter. Small chronic cerebellar infarcts. Chronic left thalamic infarct. Additional patchy foci of T2 hyperintensity in the supratentorial white matter probably reflect stable chronic microvascular ischemic changes. Vascular: Major vessel flow voids at the skull base are preserved. Skull and upper cervical spine: Left frontal craniotomy. Normal marrow signal is preserved. Sinuses/Orbits: Paranasal sinuses are aerated. Orbits are  unremarkable. Other: Sella is unremarkable.  Mastoid air cells are clear. IMPRESSION: Stable post-operative/post-treatment appearance. No evidence of tumor progression. Electronically Signed   By: PMacy MisM.D.   On: 06/21/2021 14:46   MR Abdomen W Wo Contrast  Result Date: 06/18/2021 CLINICAL DATA:  Right renal cyst EXAM: MRI ABDOMEN WITHOUT AND WITH CONTRAST TECHNIQUE: Multiplanar multisequence MR imaging of the abdomen was performed both before and after the administration of intravenous contrast. CONTRAST:  77mGADAVIST GADOBUTROL 1 MMOL/ML IV SOLN COMPARISON:  02/23/2021 FINDINGS: Lower chest: No acute findings. Hepatobiliary: Simple cyst of the posterior right lobe of the liver. No mass or other parenchymal abnormality identified. Pancreas: No mass, inflammatory changes, or other parenchymal abnormality identified. Spleen:  Within normal limits in size and appearance. Adrenals/Urinary Tract: There is a complex exophytic mass of the lateral midportion of the right kidney, which demonstrates a rim of thickened, somewhat nodular soft tissue and associated contrast enhancement and measures 5.7 x 5.1 cm, not significantly changed compared to prior examination at which time it measured 5.7 x 4.9 cm when measured similarly (series 18, image 44). There is some intrinsic T1 hyperintensity to this structure. There are multiple additional bilateral renal cysts of varying sizes, most notably a large, thinly septated exophytic cyst of the inferior pole of the right kidney and a simple cyst of the inferior pole of the left kidney. No evidence of hydronephrosis. Stomach/Bowel: Visualized portions within the abdomen are unremarkable. Vascular/Lymphatic: No pathologically enlarged lymph nodes identified. No abdominal aortic aneurysm demonstrated. Other:  None. Musculoskeletal: No suspicious bone lesions identified. IMPRESSION: 1. There is a complex exophytic mass of the lateral midportion of the right kidney, which  demonstrates a rim of thickened, somewhat nodular soft tissue and associated contrast enhancement, unchanged in size, measuring 5.7 x 5.1 cm. This remains highly concerning for renal cell carcinoma. 2. No evidence of lymphadenopathy or metastatic disease in the abdomen. 3. Multiple additional bilateral benign renal cysts of varying sizes. Electronically Signed   By: Eddie Candle M.D.   On: 06/18/2021 10:16     Assessment/Plan Frontal glioblastoma multiforme ([place]) [C71.1]   [name] is clinically stable today, now having completed 6th cycles of adjuvant Temodar.  No new or progressive deficits.  Because of very good local control on MRI, lack of symptoms, we recommended transitioning to serial imaging monitoring at this time.    Keppra should maintain at 1066m BID for now.  Will con't to follow with urology regarding likely renal cell carcinoma, surgical candidacy.  We ask that DGardenia Phlegmreturn to clinic in 3 month with MRI brain for evaluation.  Will defer further chemo if scan is stable.  All questions were answered. The patient knows to call the clinic with any problems, questions or concerns. No barriers to learning were detected.  I have spent a total of 30 minutes of face-to-face and non-face-to-face time, excluding clinical staff time, preparing to see patient, ordering tests and/or medications, counseling the patient, and independently interpreting results and communicating results to the patient/family/caregiver    ZVentura Sellers MD Medical Director of Neuro-Oncology CM[facility] Centerat [place]08/09/22 12:06 PM

## 2021-06-27 ENCOUNTER — Other Ambulatory Visit (HOSPITAL_COMMUNITY): Payer: Self-pay

## 2021-06-28 ENCOUNTER — Other Ambulatory Visit (HOSPITAL_COMMUNITY): Payer: Self-pay

## 2021-07-23 DIAGNOSIS — Q612 Polycystic kidney, adult type: Secondary | ICD-10-CM | POA: Diagnosis not present

## 2021-07-23 DIAGNOSIS — D49511 Neoplasm of unspecified behavior of right kidney: Secondary | ICD-10-CM | POA: Diagnosis not present

## 2021-07-25 ENCOUNTER — Other Ambulatory Visit: Payer: Self-pay | Admitting: Urology

## 2021-08-12 NOTE — Patient Instructions (Signed)
DUE TO COVID-19 ONLY ONE VISITOR IS ALLOWED TO COME WITH YOU AND STAY IN THE WAITING ROOM ONLY DURING PRE OP AND PROCEDURE DAY OF SURGERY IF YOU ARE GOING HOME AFTER SURGERY. IF YOU ARE SPENDING THE NIGHT 2 PEOPLE MAY VISIT WITH YOU IN YOUR PRIVATE ROOM AFTER SURGERY UNTIL VISITING  HOURS ARE OVER AT 8:00 PM AND 1 VISITORS CAN SPEND THE NIGHT.   YOU NEED TO HAVE A COVID 19 TEST ON_10/10___THIS TEST MUST BE DONE BEFORE SURGERY,  COVID TESTING SITE  IS LOCATED AT [place], [place]. REMAIN IN YOUR CAR THIS IS A DRIVE UP TEST. AFTER YOUR COVID TEST PLEASE WEAR A MASK OUT IN PUBLIC AND SOCIAL DISTANCE AND [place] YOUR HANDS FREQUENTLY, ALSO ASK ALL YOUR CLOSE CONTACT PERSONS TO WEAR A MASK AND SOCIAL DISTANCE AND [place] THEIR HANDS FREQUENTLY ALSO.               [name]     Your procedure is scheduled on: 08/28/21   Report to [facility] Main  Entrance   Report to admitting at   9:15 AM     Call this number if you have problems the morning of surgery [phone]    Remember: Do not eat food  :After Midnight the night before your surgery,     You may have clear liquids from midnight until ---.8:30 am    CLEAR LIQUID DIET   Foods Allowed                                                                     Foods Excluded                            liquids that you cannot  Plain Jell-O any favor except red or purple                                           see through such as: Fruit ices (not with fruit pulp)                                     milk, soups, orange juice  Iced Popsicles                                    All solid food Carbonated beverages, regular and diet                                    Cranberry, grape and apple juices Sports drinks like Gatorade Lightly seasoned clear broth or consume(fat free) Sugar    BRUSH YOUR TEETH MORNING OF SURGERY AND RINSE YOUR MOUTH OUT, NO CHEWING GUM CANDY OR MINTS.     Take these medicines the  morning of surgery with A SIP OF WATER: Keppra, Amlodipine  You may not have any metal on your body including              piercings  Do not wear jewelry,  lotions, powders or  deodorant             Men may shave face and neck.   Do not bring valuables to the hospital. [place].  Contacts, dentures or bridgework may not be worn into surgery.  Leave suitcase in the car. After surgery it may be brought to your room.                 [facility] - Preparing for Surgery Before surgery, you can play an important role.  Because skin is not sterile, your skin needs to be as free of germs as possible.  You can reduce the number of germs on your skin by washing with CHG (chlorahexidine gluconate) soap before surgery.  CHG is an antiseptic cleaner which kills germs and bonds with the skin to continue killing germs even after washing. Please DO NOT use if you have an allergy to CHG or antibacterial soaps.  If your skin becomes reddened/irritated stop using the CHG and inform your nurse when you arrive at Short Stay.   You may shave your face/neck. Please follow these instructions carefully:  1.  Shower with CHG Soap the night before surgery and the  morning of Surgery.  2.  If you choose to wash your hair, wash your hair first as usual with your  normal  shampoo.  3.  After you shampoo, rinse your hair and body thoroughly to remove the  shampoo.                            4.  Use CHG as you would any other liquid soap.  You can apply chg directly  to the skin and wash                       Gently with a scrungie or clean washcloth.  5.  Apply the CHG Soap to your body ONLY FROM THE NECK DOWN.   Do not use on face/ open                           Wound or open sores. Avoid contact with eyes, ears mouth and genitals (private parts).                       Wash face,  Genitals (private parts) with your normal soap.              6.  Wash thoroughly, paying special attention to the area where your surgery  will be performed.  7.  Thoroughly rinse your body with warm water from the neck down.  8.  DO NOT shower/wash with your normal soap after using and rinsing off  the CHG Soap.             9.  Pat yourself dry with a clean towel.            10.  Wear clean pajamas.            11.  Place clean sheets on your bed the night of your first shower and do  not  sleep with pets. Day of Surgery : Do not apply any lotions/deodorants the morning of surgery.  Please wear clean clothes to the hospital/surgery center.  FAILURE TO FOLLOW THESE INSTRUCTIONS MAY RESULT IN THE CANCELLATION OF YOUR SURGERY PATIENT SIGNATURE_________________________________  NURSE SIGNATURE__________________________________  ________________________________________________________________________

## 2021-08-13 ENCOUNTER — Other Ambulatory Visit: Payer: Self-pay

## 2021-08-13 ENCOUNTER — Encounter (HOSPITAL_COMMUNITY)
Admission: RE | Admit: 2021-08-13 | Discharge: 2021-08-13 | Disposition: A | Discharge status: Home / Self Care | Payer: Medicare Other | Source: Ambulatory Visit | Attending: Urology | Admitting: Urology

## 2021-08-13 ENCOUNTER — Encounter (HOSPITAL_COMMUNITY): Payer: Self-pay

## 2021-08-13 DIAGNOSIS — Z01812 Encounter for preprocedural laboratory examination: Secondary | ICD-10-CM | POA: Diagnosis not present

## 2021-08-13 DIAGNOSIS — N2889 Other specified disorders of kidney and ureter: Secondary | ICD-10-CM | POA: Insufficient documentation

## 2021-08-13 LAB — BASIC METABOLIC PANEL
Anion gap: 8 (ref 5–15)
BUN: 15 mg/dL (ref 8–23)
CO2: 28 mmol/L (ref 22–32)
Calcium: 9.3 mg/dL (ref 8.9–10.3)
Chloride: 106 mmol/L (ref 98–111)
Creatinine, Ser: 1.14 mg/dL (ref 0.61–1.24)
GFR, Estimated: >60 mL/min (ref >60)
Glucose, Bld: 108 mg/dL — ABNORMAL HIGH (ref 70–99)
Potassium: 4.3 mmol/L (ref 3.5–5.1)
Sodium: 142 mmol/L (ref 135–145)

## 2021-08-13 LAB — CBC
HCT: 48 % (ref 39.0–52.0)
Hemoglobin: 15.9 g/dL (ref 13.0–17.0)
MCH: 30.1 pg (ref 26.0–34.0)
MCHC: 33.1 g/dL (ref 30.0–36.0)
MCV: 90.7 fL (ref 80.0–100.0)
Platelets: 240 10*3/uL (ref 150–400)
RBC: 5.29 MIL/uL (ref 4.22–5.81)
RDW: 12.4 % (ref 11.5–15.5)
WBC: 6.3 10*3/uL (ref 4.0–10.5)
nRBC: 0 % (ref 0.0–0.2)

## 2021-08-13 LAB — GLUCOSE, CAPILLARY: Glucose-Capillary: 110 mg/dL — ABNORMAL HIGH (ref 70–99)

## 2021-08-13 NOTE — Progress Notes (Signed)
COVID test 10/10   PCP - Dr. Sheral Apley LOV 02/18/21 Cardiologist - none  Chest x-ray - no EKG - 09/12/20-epic Stress Test - 2019 ECHO - 09/17/19- Care everywhere Cardiac Cath - no Pacemaker/ICD device last checked:NA   Sleep Study - yes CPAP - no. Pt stopped using it since Nov. 2021  Fasting Blood Sugar - NA Checks Blood Sugar _____ times a day  Blood Thinner Instructions:NA watchman procedure Aspirin Instructions: Last Dose:  Anesthesia review: Yes  Patient denies shortness of breath, fever, cough and chest pain at PAT appointment Pt doesn't do much and denies SOB with ADLs  Patient verbalized understanding of instructions that were given to them at the PAT appointment. Patient was also instructed that they will need to review over the PAT instructions again at home before surgery. Yes

## 2021-08-14 LAB — HEMOGLOBIN A1C
Hgb A1c MFr Bld: 5.9 % — ABNORMAL HIGH (ref 4.8–5.6)
Mean Plasma Glucose: 123 mg/dL

## 2021-08-20 DIAGNOSIS — Q612 Polycystic kidney, adult type: Secondary | ICD-10-CM | POA: Diagnosis not present

## 2021-08-20 DIAGNOSIS — D49511 Neoplasm of unspecified behavior of right kidney: Secondary | ICD-10-CM | POA: Diagnosis not present

## 2021-08-21 ENCOUNTER — Ambulatory Visit: Payer: Medicare Other | Admitting: Hematology & Oncology

## 2021-08-21 ENCOUNTER — Other Ambulatory Visit: Payer: Medicare Other

## 2021-08-25 NOTE — Progress Notes (Signed)
Cardiology Office Note:    Date:  08/26/2021   ID:  Mathew Leach, DOB [birth year], MRN [id number]  PCP:  Maryella Shivers, MD  Cardiologist:  Shirlee More, MD    Referring MD: Maryella Shivers, MD    ASSESSMENT:    1. PAF (paroxysmal atrial fibrillation) ([place])   2. Presence of Watchman left atrial appendage closure device   3. 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 PFO (patent foramen ovale)    PLAN:    In order of problems listed above:  From cardiology perspective doing well maintaining sinus rhythm previous left atrial occlusion device and does not require anticoagulation. BP at target continue his calcium channel blocker Toprol hold the day of surgery with a systolic of 637 today Stable small does not require closure My opinion he is optimized for his planned urologic surgery nephrectomy robotic assisted   Next appointment: 1 year   Medication Adjustments/Labs and Tests Ordered: Current medicines are reviewed at length with the patient today.  Concerns regarding medicines are outlined above.  No orders of the defined types were placed in this encounter.  No orders of the defined types were placed in this encounter.   Chief Complaint  Patient presents with   Follow-up    History of Present Illness:    Mathew Leach is a 70 y.o. male with a hx of stroke PFO with right-to-left shunt and left atrial occlusion watchman device [facility] in 2020 small PFO hypertension and dyslipidemia last seen 07/16/2020.  Compliance with diet, lifestyle and medications: Yes  Then he is having no edema shortness of breath chest pain palpitation or syncope.  He follows with oncology Dr. Marin Olp and has a glioblastoma he also has a complex renal mass followed by urology and is scheduled for assisted partial nephrectomy and renal cyst decortication Dr. 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 Past Medical History:  Diagnosis Date   Actinic keratosis    scalp   Arthritis    ASD (atrial septal defect) 06/10/2015   Atrial flutter, paroxysmal ([place]) 08/01/2015   Cancer of right kidney ([place]) 06/05/2021   Chronic anticoagulation 08/29/2015   Chronic pain of right knee 12/07/2014   Dysplastic nevus 01/11/2015   upper back spinal   Dysplastic nevus 01/29/2017   right superior calf inferior to popliteal   Dysrhythmia    AFIB   Goals of care, counseling/discussion 09/17/2020   Hypertension    Kidney stones    Obstructive sleep apnea syndrome 06/26/2015   PAF (paroxysmal atrial fibrillation) ([place]) 07/30/2015   Pericarditis    1999   PFO (patent foramen ovale) 06/16/2018   Preoperative cardiovascular examination 02/03/2016   S/P TKR (total knee replacement) using cement, 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   Shortness of breath dyspnea    W/ EXERTION    Sleep apnea    CPAP   Stroke ([place])    04-2015    Past Surgical History:  Procedure Laterality Date   APPLICATION OF CRANIAL NAVIGATION Left 09/18/2020   Procedure: APPLICATION OF CRANIAL NAVIGATION;  Surgeon: Consuella Lose, MD;  Location: [place];  Service: Neurosurgery;  Laterality: Left;   COLONOSCOPY  04/10/2011   Moderate predominantly sigmoid diverticulosis. Small internal hemorrhoids.    COLONOSCOPY  01/30/2020   CRANIOTOMY Left 09/18/2020   Procedure: LEFT FRONTAL CRANIOTOMY FOR  TUMOR;  Surgeon: Consuella Lose, MD;  Location: [place];  Service: Neurosurgery;  Laterality: Left;  left  CYST REMOVAL NECK     AGE 89   ESOPHAGOGASTRODUODENOSCOPY  04/10/2011   Erosive esophagitis with esophageal stricture (asymptomatic strictures since the patient not having any dysphagia). LA grade D esophagitis.   INGUINAL HERNIA REPAIR     LEFT AS CHILD   TOTAL KNEE ARTHROPLASTY Right 03/17/2016   Procedure: TOTAL KNEE ARTHROPLASTY;  Surgeon: [name], MD;  Location: [place];  Service: Orthopedics;  Laterality: Right;   watchman procedure   07/2019   no longer needs Eliquis    Current Medications: Current Meds  Medication Sig   amLODipine (NORVASC) 10 MG tablet Take 10 mg by mouth daily.   levETIRAcetam (KEPPRA) 1000 MG tablet Take 1,000 mg by mouth 2 (two) times daily.   loperamide (IMODIUM A-D) 2 MG capsule Take 2 mg by mouth daily as needed for diarrhea or loose stools.   simvastatin (ZOCOR) 20 MG tablet Take 1 tablet (20 mg total) by mouth daily. (Patient taking differently: Take 20 mg by mouth daily with lunch.)     Allergies:   Lipitor [atorvastatin]   Social History   Socioeconomic History   Marital status: Married    Spouse name: Not on file   Number of children: Not on file   Years of education: Not on file   Highest education level: Not on file  Occupational History   Not on file  Tobacco Use   Smoking status: Never   Smokeless tobacco: Never  Vaping Use   Vaping Use: Never used  Substance and Sexual Activity   Alcohol use: No   Drug use: No   Sexual activity: Not on file  Other Topics Concern   Not on file  Social History Narrative   Not on file   Social Determinants of Health   Financial Resource Strain: Not on file  Food Insecurity: Not on file  Transportation Needs: Not on file  Physical Activity: Not on file  Stress: Not on file  Social Connections: Not on file     Family History: The patient's family history includes Lung cancer in his father. There is no history of Colon cancer, Esophageal cancer, Colon polyps, Rectal cancer, or Stomach cancer. ROS:   Please see the history of present illness.    All other systems reviewed and are negative.  EKGs/Labs/Other Studies Reviewed:    The following studies were reviewed today:  EKG:  EKG ordered today and personally reviewed.  The ekg ordered today demonstrates sinus rhythm normal EKG  Recent Labs: 06/25/2021: ALT 16 08/13/2021: BUN 15; Creatinine, Ser 1.14; Hemoglobin 15.9; Platelets 240; Potassium 4.3; Sodium 142  Recent Lipid  Panel    Component Value Date/Time   CHOL 163 02/13/2020 1037   TRIG 66 02/13/2020 1037   HDL 44 02/13/2020 1037   CHOLHDL 3.7 02/13/2020 1037   LDLCALC 106 (H) 02/13/2020 1037    Physical Exam:    VS:  BP 110/62 (BP Location: Right Arm, Patient Position: Sitting, Cuff Size: Normal)   Pulse 79   Ht 5\' 8"  (1.727 m)   Wt 164 lb 9.6 oz (74.7 kg)   SpO2 97%   BMI 25.03 kg/m     Wt Readings from Last 3 Encounters:  08/26/21 164 lb 9.6 oz (74.7 kg)  08/13/21 160 lb (72.6 kg)  06/25/21 158 lb 3.2 oz (71.8 kg)     GEN:  Well nourished, well developed in no acute distress HEENT: Normal NECK: No JVD; No carotid bruits LYMPHATICS: No lymphadenopathy CARDIAC: RRR, no murmurs, rubs,  gallops RESPIRATORY:  Clear to auscultation without rales, wheezing or rhonchi  ABDOMEN: Soft, non-tender, non-distended MUSCULOSKELETAL:  No edema; No deformity  SKIN: Warm and dry NEUROLOGIC:  Alert and oriented x 3 PSYCHIATRIC:  Normal affect    Signed, Shirlee More, MD  08/26/2021 1:40 PM    [facility]

## 2021-08-26 ENCOUNTER — Other Ambulatory Visit: Payer: Self-pay | Admitting: Urology

## 2021-08-26 ENCOUNTER — Ambulatory Visit (INDEPENDENT_AMBULATORY_CARE_PROVIDER_SITE_OTHER): Payer: Medicare Other | Admitting: Cardiology

## 2021-08-26 ENCOUNTER — Encounter: Payer: Self-pay | Admitting: Cardiology

## 2021-08-26 ENCOUNTER — Other Ambulatory Visit: Payer: Self-pay

## 2021-08-26 VITALS — BP 110/62 | HR 79 | Ht 68.0 in | Wt 164.6 lb

## 2021-08-26 DIAGNOSIS — I480 Paroxysmal atrial fibrillation: Secondary | ICD-10-CM

## 2021-08-26 DIAGNOSIS — I119 Hypertensive heart disease without heart failure: Secondary | ICD-10-CM

## 2021-08-26 DIAGNOSIS — Q2112 Patent foramen ovale: Secondary | ICD-10-CM | POA: Diagnosis not present

## 2021-08-26 DIAGNOSIS — Z95818 Presence of other cardiac implants and grafts: Secondary | ICD-10-CM

## 2021-08-26 LAB — SARS CORONAVIRUS 2 (TAT 6-24 HRS): SARS Coronavirus 2: NEGATIVE

## 2021-08-26 NOTE — Patient Instructions (Signed)
Medication Instructions:  Your physician has recommended you make the following change in your medication: STOP: Amlodipine *If you need a refill on your cardiac medications before your next appointment, please call your pharmacy*   Lab Work: None If you have labs (blood work) drawn today and your tests are completely normal, you will receive your results only by: [place] (if you have MyChart) OR A paper copy in the mail If you have any lab test that is abnormal or we need to change your treatment, we will call you to review the results.   Testing/Procedures: None   Follow-Up: At [facility], you and your health needs are our priority.  As part of our continuing mission to provide you with exceptional heart care, we have created designated Provider Care Teams.  These Care Teams include your primary Cardiologist (physician) and Advanced Practice Providers (APPs -  Physician Assistants and Nurse Practitioners) who all work together to provide you with the care you need, when you need it.  We recommend signing up for the patient portal called "MyChart".  Sign up information is provided on this After Visit Summary.  MyChart is used to connect with patients for Virtual Visits (Telemedicine).  Patients are able to view lab/test results, encounter notes, upcoming appointments, etc.  Non-urgent messages can be sent to your provider as well.   To learn more about what you can do with MyChart, go to [email or web address].    Your next appointment:   1 year(s)  The format for your next appointment:   In Person  Provider:   Shirlee More, MD   Other Instructions

## 2021-08-27 ENCOUNTER — Telehealth: Payer: Self-pay

## 2021-08-27 NOTE — Telephone Encounter (Signed)
Spoke with [name] who states they were told to stop Amlodipine the morning of the pt's surgery but that his AVS states to stop Amlodipine. Your note states BP at target continue his calcium channel blocker Toprol hold the day of surgery with a systolic of 448 today. Pt does not take Toprol.   Is he only to hold Amlodipine the day of surgery or stop it completley?    Please advise.

## 2021-08-27 NOTE — Progress Notes (Signed)
Called patient and informed him of time change for surgery on 08/28/21. He is to arrive 0615 for 0830 surgery. He verbalizes understanding.

## 2021-08-27 NOTE — Progress Notes (Signed)
Anesthesia Chart Review   Case: [id number] Date/Time: 08/28/21 1145   Procedure: XI ROBOTIC ASSISTED PARTIAL NEPHRECTOMY AND RENAL CYST DECORTICATION (Right) - 3 HRS   Anesthesia type: General   Pre-op diagnosis: RIGHT RENAL MASS   Location: [place] 03 / [facility]   Surgeons: Alexis Frock, MD       DISCUSSION:70 y.o. never smoker with h/o PAF (s/p watchman device), OSA on CPAP, PFO, right renal mass scheduled for above procedure 08/28/2021 with Dr. Alexis Frock.   Pt last seen by cardiology 08/26/2021. Per OV note, "My opinion he is optimized for his planned urologic surgery nephrectomy robotic assisted"  Anticipate pt can proceed with planned procedure barring acute status change.   VS: BP 136/82   Pulse 64   Temp (!) 36.1 C (Oral)   Resp 20   Ht 5\' 8"  (1.727 m)   Wt 72.6 kg   SpO2 98%   BMI 24.33 kg/m   PROVIDERS: Maryella Shivers, MD is PCP    LABS: Labs reviewed: Acceptable for surgery. (all labs ordered are listed, but only abnormal results are displayed)  Labs Reviewed  HEMOGLOBIN A1C - Abnormal; Notable for the following components:      Result Value   Hgb A1c MFr Bld 5.9 (*)    All other components within normal limits  BASIC METABOLIC PANEL - Abnormal; Notable for the following components:   Glucose, Bld 108 (*)    All other components within normal limits  GLUCOSE, CAPILLARY - Abnormal; Notable for the following components:   Glucose-Capillary 110 (*)    All other components within normal limits  CBC     IMAGES:   EKG: 08/26/2021 Rate 79 bpm  NSR Nonspecific T wave abnormality   CV: Echo 09/17/2019 Summary    1. Percutaneous left atrial appendage closure (Watchman device, implanted  08/03/2019).    2. Mildly decreased left ventricular systolic function.    3. Dilated left atrium - mildly dilated.    4. Dilated right atrium - mildly dilated.    5. Aortic sclerosis.    6. Degenerative mitral valve disease - mildly thickened.    7. Patent  foramen ovale.    8. Iatrogenic atrial septal defect - small.  Past Medical History:  Diagnosis Date   Actinic keratosis    scalp   Arthritis    ASD (atrial septal defect) 06/10/2015   Atrial flutter, paroxysmal ([place]) 08/01/2015   Cancer of right kidney ([place]) 06/05/2021   Chronic anticoagulation 08/29/2015   Chronic pain of right knee 12/07/2014   Dysplastic nevus 01/11/2015   upper back spinal   Dysplastic nevus 01/29/2017   right superior calf inferior to popliteal   Dysrhythmia    AFIB   Goals of care, counseling/discussion 09/17/2020   Hypertension    Kidney stones    Obstructive sleep apnea syndrome 06/26/2015   PAF (paroxysmal atrial fibrillation) ([place]) 07/30/2015   Pericarditis    1999   PFO (patent foramen ovale) 06/16/2018   Preoperative cardiovascular examination 02/03/2016   S/P TKR (total knee replacement) using cement, 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   Shortness of breath dyspnea    W/ EXERTION    Sleep apnea    CPAP   Stroke ([place])    04-2015    Past Surgical History:  Procedure Laterality Date   APPLICATION OF CRANIAL NAVIGATION Left 09/18/2020   Procedure: APPLICATION OF CRANIAL NAVIGATION;  Surgeon: Consuella Lose, MD;  Location: [place];  Service: Neurosurgery;  Laterality: Left;   COLONOSCOPY  04/10/2011   Moderate predominantly sigmoid diverticulosis. Small internal hemorrhoids.    COLONOSCOPY  01/30/2020   CRANIOTOMY Left 09/18/2020   Procedure: LEFT FRONTAL CRANIOTOMY FOR  TUMOR;  Surgeon: Consuella Lose, MD;  Location: [place];  Service: Neurosurgery;  Laterality: Left;  left   CYST REMOVAL NECK     AGE 32   ESOPHAGOGASTRODUODENOSCOPY  04/10/2011   Erosive esophagitis with esophageal stricture (asymptomatic strictures since the patient not having any dysphagia). LA grade D esophagitis.   INGUINAL HERNIA REPAIR     LEFT AS CHILD   TOTAL KNEE ARTHROPLASTY Right 03/17/2016   Procedure: TOTAL KNEE ARTHROPLASTY;  Surgeon: [name], MD;  Location: [place];  Service:  Orthopedics;  Laterality: Right;   watchman procedure  07/2019   no longer needs Eliquis    MEDICATIONS:  levETIRAcetam (KEPPRA) 1000 MG tablet   loperamide (IMODIUM A-D) 2 MG capsule   simvastatin (ZOCOR) 20 MG tablet   No current facility-administered medications for this encounter.    Konrad Felix Ward, PA-C [facility] Pre-Surgical Testing [phone]

## 2021-08-27 NOTE — Anesthesia Preprocedure Evaluation (Addendum)
Anesthesia Evaluation  Patient identified by MRN, date of birth, ID band Patient awake    Reviewed: Allergy & Precautions, NPO status , Patient's Chart, lab work & pertinent test results  Airway Mallampati: II  TM Distance: >3 FB Neck ROM: Full    Dental no notable dental hx.    Pulmonary sleep apnea ,    Pulmonary exam normal breath sounds clear to auscultation       Cardiovascular hypertension, Normal cardiovascular exam Rhythm:Regular Rate:Normal     Neuro/Psych CVA negative psych ROS   GI/Hepatic negative GI ROS, Neg liver ROS,   Endo/Other  negative endocrine ROS  Renal/GU negative Renal ROS  negative genitourinary   Musculoskeletal negative musculoskeletal ROS (+)   Abdominal   Peds negative pediatric ROS (+)  Hematology negative hematology ROS (+)   Anesthesia Other Findings   Reproductive/Obstetrics negative OB ROS                            Anesthesia Physical Anesthesia Plan  ASA: 3  Anesthesia Plan: General   Post-op Pain Management:    Induction: Intravenous  PONV Risk Score and Plan: 2 and Ondansetron, Dexamethasone and Treatment may vary due to age or medical condition  Airway Management Planned: Oral ETT  Additional Equipment:   Intra-op Plan:   Post-operative Plan: Extubation in OR  Informed Consent: I have reviewed the patients History and Physical, chart, labs and discussed the procedure including the risks, benefits and alternatives for the proposed anesthesia with the patient or authorized representative who has indicated his/her understanding and acceptance.     Dental advisory given  Plan Discussed with: CRNA and Surgeon  Anesthesia Plan Comments: (See PAT note 08/13/2021, Konrad Felix Ward, PA-C)       Anesthesia Quick Evaluation

## 2021-08-27 NOTE — Telephone Encounter (Signed)
Recommendations reviewed with pt as per Dr. Joya Gaskins note.  Pt's wife verbalized understanding and had no additional questions.

## 2021-08-28 ENCOUNTER — Other Ambulatory Visit: Payer: Self-pay

## 2021-08-28 ENCOUNTER — Encounter (HOSPITAL_COMMUNITY): Payer: Self-pay | Admitting: Urology

## 2021-08-28 ENCOUNTER — Observation Stay (HOSPITAL_COMMUNITY)
Admission: RE | Admit: 2021-08-28 | Discharge: 2021-08-29 | Disposition: A | Discharge status: Home / Self Care | Payer: Medicare Other | Source: Ambulatory Visit | Attending: Urology | Admitting: Urology

## 2021-08-28 ENCOUNTER — Ambulatory Visit (HOSPITAL_COMMUNITY): Payer: Medicare Other | Admitting: Certified Registered Nurse Anesthetist

## 2021-08-28 ENCOUNTER — Encounter (HOSPITAL_COMMUNITY)
Admission: RE | Disposition: A | Discharge status: Home / Self Care | Payer: Self-pay | Source: Ambulatory Visit | Attending: Urology

## 2021-08-28 ENCOUNTER — Ambulatory Visit (HOSPITAL_COMMUNITY): Payer: Medicare Other | Admitting: Physician Assistant

## 2021-08-28 DIAGNOSIS — N2889 Other specified disorders of kidney and ureter: Secondary | ICD-10-CM | POA: Diagnosis present

## 2021-08-28 DIAGNOSIS — Z9989 Dependence on other enabling machines and devices: Secondary | ICD-10-CM | POA: Diagnosis not present

## 2021-08-28 DIAGNOSIS — C641 Malignant neoplasm of right kidney, except renal pelvis: Principal | ICD-10-CM | POA: Insufficient documentation

## 2021-08-28 DIAGNOSIS — Z7901 Long term (current) use of anticoagulants: Secondary | ICD-10-CM | POA: Diagnosis not present

## 2021-08-28 DIAGNOSIS — G4733 Obstructive sleep apnea (adult) (pediatric): Secondary | ICD-10-CM | POA: Diagnosis not present

## 2021-08-28 DIAGNOSIS — I10 Essential (primary) hypertension: Secondary | ICD-10-CM | POA: Diagnosis not present

## 2021-08-28 DIAGNOSIS — Z96651 Presence of right artificial knee joint: Secondary | ICD-10-CM | POA: Insufficient documentation

## 2021-08-28 DIAGNOSIS — I480 Paroxysmal atrial fibrillation: Secondary | ICD-10-CM | POA: Insufficient documentation

## 2021-08-28 DIAGNOSIS — Z79899 Other long term (current) drug therapy: Secondary | ICD-10-CM | POA: Diagnosis not present

## 2021-08-28 DIAGNOSIS — N269 Renal sclerosis, unspecified: Secondary | ICD-10-CM | POA: Diagnosis not present

## 2021-08-28 DIAGNOSIS — N281 Cyst of kidney, acquired: Secondary | ICD-10-CM | POA: Diagnosis not present

## 2021-08-28 DIAGNOSIS — Q6101 Congenital single renal cyst: Secondary | ICD-10-CM | POA: Insufficient documentation

## 2021-08-28 HISTORY — PX: ROBOTIC ASSITED PARTIAL NEPHRECTOMY: SHX6087

## 2021-08-28 LAB — HEMOGLOBIN AND HEMATOCRIT, BLOOD
HCT: 43.3 % (ref 39.0–52.0)
Hemoglobin: 14.7 g/dL (ref 13.0–17.0)

## 2021-08-28 SURGERY — NEPHRECTOMY, PARTIAL, ROBOT-ASSISTED
Anesthesia: General | Laterality: Right

## 2021-08-28 MED ORDER — POLYETHYLENE GLYCOL 3350 17 GM/SCOOP PO POWD: 1.0000 | Freq: Once | ORAL | Status: DC

## 2021-08-28 MED ORDER — HYDROMORPHONE HCL 1 MG/ML IJ SOLN: 0.2500 mg | INTRAMUSCULAR | Status: DC | PRN

## 2021-08-28 MED ORDER — SUGAMMADEX SODIUM 200 MG/2ML IV SOLN
INTRAVENOUS | Status: DC | PRN
  Administered 2021-08-28: 300 mg via INTRAVENOUS

## 2021-08-28 MED ORDER — SIMVASTATIN 20 MG PO TABS
20.0000 mg | ORAL_TABLET | Freq: Every day | ORAL | Status: DC
  Administered 2021-08-28 – 2021-08-29 (×2): 20 mg via ORAL
  Filled 2021-08-28 (×2): qty 1

## 2021-08-28 MED ORDER — SENNOSIDES-DOCUSATE SODIUM 8.6-50 MG PO TABS
2.0000 | ORAL_TABLET | Freq: Every day | ORAL | Status: DC
  Administered 2021-08-28: 2 via ORAL
  Filled 2021-08-28: qty 2

## 2021-08-28 MED ORDER — DIPHENHYDRAMINE HCL 12.5 MG/5ML PO ELIX: 12.5000 mg | ORAL_SOLUTION | Freq: Four times a day (QID) | ORAL | Status: DC | PRN

## 2021-08-28 MED ORDER — ONDANSETRON HCL 4 MG/2ML IJ SOLN: 4.0000 mg | Freq: Once | INTRAMUSCULAR | Status: DC | PRN

## 2021-08-28 MED ORDER — HYDROMORPHONE HCL 1 MG/ML IJ SOLN: 0.5000 mg | INTRAMUSCULAR | Status: DC | PRN

## 2021-08-28 MED ORDER — SODIUM CHLORIDE (PF) 0.9 % IJ SOLN
INTRAMUSCULAR | Status: AC
  Filled 2021-08-28: qty 20

## 2021-08-28 MED ORDER — DEXTROSE-NACL 5-0.45 % IV SOLN: INTRAVENOUS | Status: DC

## 2021-08-28 MED ORDER — ONDANSETRON HCL 4 MG/2ML IJ SOLN: 4.0000 mg | INTRAMUSCULAR | Status: DC | PRN

## 2021-08-28 MED ORDER — LACTATED RINGERS IV SOLN: INTRAVENOUS | Status: DC | PRN

## 2021-08-28 MED ORDER — ONDANSETRON HCL 4 MG/2ML IJ SOLN
INTRAMUSCULAR | Status: AC
  Filled 2021-08-28: qty 2

## 2021-08-28 MED ORDER — ACETAMINOPHEN 10 MG/ML IV SOLN
1000.0000 mg | Freq: Four times a day (QID) | INTRAVENOUS | Status: AC
  Administered 2021-08-28 – 2021-08-29 (×4): 1000 mg via INTRAVENOUS
  Filled 2021-08-28 (×4): qty 100

## 2021-08-28 MED ORDER — SODIUM CHLORIDE 0.9% FLUSH
INTRAVENOUS | Status: DC | PRN
  Administered 2021-08-28: 20 mL

## 2021-08-28 MED ORDER — ROCURONIUM BROMIDE 10 MG/ML (PF) SYRINGE
PREFILLED_SYRINGE | INTRAVENOUS | Status: DC | PRN
  Administered 2021-08-28: 70 mg via INTRAVENOUS
  Administered 2021-08-28 (×2): 30 mg via INTRAVENOUS

## 2021-08-28 MED ORDER — OXYCODONE HCL 5 MG PO TABS
5.0000 mg | ORAL_TABLET | ORAL | Status: DC | PRN
  Administered 2021-08-29 (×2): 5 mg via ORAL
  Filled 2021-08-28 (×2): qty 1

## 2021-08-28 MED ORDER — EPHEDRINE SULFATE-NACL 50-0.9 MG/10ML-% IV SOSY
PREFILLED_SYRINGE | INTRAVENOUS | Status: DC | PRN
  Administered 2021-08-28: 10 mg via INTRAVENOUS

## 2021-08-28 MED ORDER — ROCURONIUM BROMIDE 10 MG/ML (PF) SYRINGE
PREFILLED_SYRINGE | INTRAVENOUS | Status: AC
  Filled 2021-08-28: qty 10

## 2021-08-28 MED ORDER — STERILE WATER FOR IRRIGATION IR SOLN
Status: DC | PRN
  Administered 2021-08-28: 1000 mL

## 2021-08-28 MED ORDER — PROPOFOL 10 MG/ML IV BOLUS
INTRAVENOUS | Status: AC
  Filled 2021-08-28: qty 20

## 2021-08-28 MED ORDER — LIDOCAINE 2% (20 MG/ML) 5 ML SYRINGE
INTRAMUSCULAR | Status: DC | PRN
  Administered 2021-08-28: 100 mg via INTRAVENOUS

## 2021-08-28 MED ORDER — LIDOCAINE HCL (PF) 2 % IJ SOLN
INTRAMUSCULAR | Status: AC
  Filled 2021-08-28: qty 5

## 2021-08-28 MED ORDER — FENTANYL CITRATE (PF) 100 MCG/2ML IJ SOLN
INTRAMUSCULAR | Status: AC
  Filled 2021-08-28: qty 2

## 2021-08-28 MED ORDER — LACTATED RINGERS IR SOLN
Status: DC | PRN
  Administered 2021-08-28: 1000 mL

## 2021-08-28 MED ORDER — PHENYLEPHRINE HCL-NACL 20-0.9 MG/250ML-% IV SOLN
INTRAVENOUS | Status: DC | PRN
  Administered 2021-08-28: 35 ug/min via INTRAVENOUS

## 2021-08-28 MED ORDER — DOCUSATE SODIUM 100 MG PO CAPS: 100.0000 mg | ORAL_CAPSULE | Freq: Two times a day (BID) | ORAL | Status: DC

## 2021-08-28 MED ORDER — BUPIVACAINE LIPOSOME 1.3 % IJ SUSP
INTRAMUSCULAR | Status: AC
  Filled 2021-08-28: qty 20

## 2021-08-28 MED ORDER — PROPOFOL 10 MG/ML IV BOLUS
INTRAVENOUS | Status: DC | PRN
  Administered 2021-08-28: 100 mg via INTRAVENOUS

## 2021-08-28 MED ORDER — CEFAZOLIN SODIUM-DEXTROSE 2-4 GM/100ML-% IV SOLN
2.0000 g | INTRAVENOUS | Status: AC
  Administered 2021-08-28: 2 g via INTRAVENOUS
  Filled 2021-08-28: qty 100

## 2021-08-28 MED ORDER — FENTANYL CITRATE (PF) 100 MCG/2ML IJ SOLN
INTRAMUSCULAR | Status: DC | PRN
  Administered 2021-08-28: 100 ug via INTRAVENOUS
  Administered 2021-08-28 (×4): 50 ug via INTRAVENOUS

## 2021-08-28 MED ORDER — TRAMADOL HCL 50 MG PO TABS: 50.0000 mg | ORAL_TABLET | Freq: Four times a day (QID) | ORAL | 0 refills | Status: DC | PRN

## 2021-08-28 MED ORDER — DEXAMETHASONE SODIUM PHOSPHATE 10 MG/ML IJ SOLN
INTRAMUSCULAR | Status: DC | PRN
  Administered 2021-08-28: 10 mg via INTRAVENOUS

## 2021-08-28 MED ORDER — LACTATED RINGERS IV SOLN: INTRAVENOUS | Status: DC

## 2021-08-28 MED ORDER — DIPHENHYDRAMINE HCL 50 MG/ML IJ SOLN: 12.5000 mg | Freq: Four times a day (QID) | INTRAMUSCULAR | Status: DC | PRN

## 2021-08-28 MED ORDER — BACITRACIN-NEOMYCIN-POLYMYXIN 400-5-5000 EX OINT: 1.0000 "application " | TOPICAL_OINTMENT | Freq: Three times a day (TID) | CUTANEOUS | Status: DC | PRN

## 2021-08-28 MED ORDER — CHLORHEXIDINE GLUCONATE 0.12 % MT SOLN
15.0000 mL | Freq: Once | OROMUCOSAL | Status: AC
  Administered 2021-08-28: 15 mL via OROMUCOSAL

## 2021-08-28 MED ORDER — LEVETIRACETAM 500 MG PO TABS
1000.0000 mg | ORAL_TABLET | Freq: Two times a day (BID) | ORAL | Status: DC
  Administered 2021-08-28 – 2021-08-29 (×2): 1000 mg via ORAL
  Filled 2021-08-28 (×2): qty 2

## 2021-08-28 MED ORDER — BUPIVACAINE LIPOSOME 1.3 % IJ SUSP
INTRAMUSCULAR | Status: DC | PRN
  Administered 2021-08-28: 20 mL

## 2021-08-28 MED ORDER — ORAL CARE MOUTH RINSE: 15.0000 mL | Freq: Once | OROMUCOSAL | Status: AC

## 2021-08-28 MED ORDER — BELLADONNA ALKALOIDS-OPIUM 16.2-60 MG RE SUPP: 1.0000 | Freq: Four times a day (QID) | RECTAL | Status: DC | PRN

## 2021-08-28 MED ORDER — FENTANYL CITRATE (PF) 250 MCG/5ML IJ SOLN
INTRAMUSCULAR | Status: AC
  Filled 2021-08-28: qty 5

## 2021-08-28 MED ORDER — EPHEDRINE 5 MG/ML INJ
INTRAVENOUS | Status: AC
  Filled 2021-08-28: qty 5

## 2021-08-28 MED ORDER — ONDANSETRON HCL 4 MG/2ML IJ SOLN
INTRAMUSCULAR | Status: DC | PRN
  Administered 2021-08-28: 4 mg via INTRAVENOUS

## 2021-08-28 MED ORDER — DEXAMETHASONE SODIUM PHOSPHATE 10 MG/ML IJ SOLN
INTRAMUSCULAR | Status: AC
  Filled 2021-08-28: qty 1

## 2021-08-28 SURGICAL SUPPLY — 76 items
ADH SKN CLS APL DERMABOND .7 (GAUZE/BANDAGES/DRESSINGS) ×1
AGENT HMST KT MTR STRL THRMB (HEMOSTASIS) ×1
APL ESCP 34 STRL LF DISP (HEMOSTASIS) ×1
APL PRP STRL LF DISP 70% ISPRP (MISCELLANEOUS) ×1
APPLICATOR SURGIFLO ENDO (HEMOSTASIS) ×2 IMPLANT
BAG COUNTER SPONGE SURGICOUNT (BAG) IMPLANT
BAG SPEC RTRVL LRG 6X4 10 (ENDOMECHANICALS) ×2
BAG SPNG CNTER NS LX DISP (BAG)
CHLORAPREP W/TINT 26 (MISCELLANEOUS) ×2 IMPLANT
CLIP LIGATING HEM O LOK PURPLE (MISCELLANEOUS) ×1 IMPLANT
CLIP LIGATING HEMO LOK XL GOLD (MISCELLANEOUS) IMPLANT
CLIP LIGATING HEMO O LOK GREEN (MISCELLANEOUS) ×6 IMPLANT
CLIP SUT LAPRA TY ABSORB (SUTURE) ×3 IMPLANT
COVER SURGICAL LIGHT HANDLE (MISCELLANEOUS) ×2 IMPLANT
COVER TIP SHEARS 8 DVNC (MISCELLANEOUS) ×1 IMPLANT
COVER TIP SHEARS 8MM DA VINCI (MISCELLANEOUS) ×2
CUTTER ECHEON FLEX ENDO 45 340 (ENDOMECHANICALS) IMPLANT
DECANTER SPIKE VIAL GLASS SM (MISCELLANEOUS) ×2 IMPLANT
DERMABOND ADVANCED (GAUZE/BANDAGES/DRESSINGS) ×1
DERMABOND ADVANCED .7 DNX12 (GAUZE/BANDAGES/DRESSINGS) ×1 IMPLANT
DRAIN CHANNEL 15F RND FF 3/16 (WOUND CARE) ×2 IMPLANT
DRAPE ARM DVNC X/XI (DISPOSABLE) ×4 IMPLANT
DRAPE COLUMN DVNC XI (DISPOSABLE) ×1 IMPLANT
DRAPE DA VINCI XI ARM (DISPOSABLE) ×8
DRAPE DA VINCI XI COLUMN (DISPOSABLE) ×2
DRAPE INCISE IOBAN 66X45 STRL (DRAPES) ×2 IMPLANT
DRAPE SHEET LG 3/4 BI-LAMINATE (DRAPES) ×2 IMPLANT
DRSG TEGADERM 4X4.75 (GAUZE/BANDAGES/DRESSINGS) ×1 IMPLANT
ELECT PENCIL ROCKER SW 15FT (MISCELLANEOUS) ×2 IMPLANT
ELECT REM PT RETURN 15FT ADLT (MISCELLANEOUS) ×2 IMPLANT
EVACUATOR SILICONE 100CC (DRAIN) ×2 IMPLANT
GAUZE 4X4 16PLY ~~LOC~~+RFID DBL (SPONGE) ×1 IMPLANT
GLOVE SURG ENC MOIS LTX SZ6.5 (GLOVE) ×2 IMPLANT
GLOVE SURG ENC TEXT LTX SZ7.5 (GLOVE) ×4 IMPLANT
GOWN STRL REUS W/TWL LRG LVL3 (GOWN DISPOSABLE) ×5 IMPLANT
HEMOSTAT SURGICEL 4X8 (HEMOSTASIS) ×2 IMPLANT
HOLDER FOLEY CATH W/STRAP (MISCELLANEOUS) ×2 IMPLANT
IRRIG SUCT STRYKERFLOW 2 WTIP (MISCELLANEOUS) ×2
IRRIGATION SUCT STRKRFLW 2 WTP (MISCELLANEOUS) ×1 IMPLANT
KIT BASIN OR (CUSTOM PROCEDURE TRAY) ×2 IMPLANT
KIT TURNOVER KIT A (KITS) ×2 IMPLANT
LOOP VESSEL MAXI BLUE (MISCELLANEOUS) ×2 IMPLANT
MARKER SKIN DUAL TIP RULER LAB (MISCELLANEOUS) ×2 IMPLANT
NDL INSUFFLATION 14GA 120MM (NEEDLE) ×1 IMPLANT
NEEDLE INSUFFLATION 14GA 120MM (NEEDLE) ×2 IMPLANT
PORT ACCESS TROCAR AIRSEAL 12 (TROCAR) ×1 IMPLANT
PORT ACCESS TROCAR AIRSEAL 5M (TROCAR) ×1
POUCH SPECIMEN RETRIEVAL 10MM (ENDOMECHANICALS) ×3 IMPLANT
PROTECTOR NERVE ULNAR (MISCELLANEOUS) ×4 IMPLANT
RELOAD STAPLE 45 2.6 WHT THIN (STAPLE) IMPLANT
SEAL CANN UNIV 5-8 DVNC XI (MISCELLANEOUS) ×4 IMPLANT
SEAL XI 5MM-8MM UNIVERSAL (MISCELLANEOUS) ×8
SET TRI-LUMEN FLTR TB AIRSEAL (TUBING) ×2 IMPLANT
SOLUTION ELECTROLUBE (MISCELLANEOUS) ×2 IMPLANT
SPONGE T-LAP 4X18 ~~LOC~~+RFID (SPONGE) ×2 IMPLANT
STAPLE RELOAD 45 WHT (STAPLE) IMPLANT
STAPLE RELOAD 45MM WHITE (STAPLE)
SURGIFLO W/THROMBIN 8M KIT (HEMOSTASIS) ×2 IMPLANT
SUT ETHILON 3 0 PS 1 (SUTURE) ×2 IMPLANT
SUT MNCRL AB 4-0 PS2 18 (SUTURE) ×4 IMPLANT
SUT PDS AB 1 CT1 27 (SUTURE) ×5 IMPLANT
SUT V-LOC BARB 180 2/0GR6 GS22 (SUTURE)
SUT VIC AB 0 CT1 27 (SUTURE) ×8
SUT VIC AB 0 CT1 27XBRD ANTBC (SUTURE) ×4 IMPLANT
SUT VIC AB 2-0 SH 27 (SUTURE) ×4
SUT VIC AB 2-0 SH 27X BRD (SUTURE) ×2 IMPLANT
SUT VLOC BARB 180 ABS3/0GR12 (SUTURE) ×2
SUTURE V-LC BRB 180 2/0GR6GS22 (SUTURE) IMPLANT
SUTURE VLOC BRB 180 ABS3/0GR12 (SUTURE) ×1 IMPLANT
TOWEL OR 17X26 10 PK STRL BLUE (TOWEL DISPOSABLE) ×2 IMPLANT
TOWEL OR NON WOVEN STRL DISP B (DISPOSABLE) ×2 IMPLANT
TRAY FOLEY MTR SLVR 16FR STAT (SET/KITS/TRAYS/PACK) ×2 IMPLANT
TRAY LAPAROSCOPIC (CUSTOM PROCEDURE TRAY) ×2 IMPLANT
TROCAR BLADELESS OPT 5 100 (ENDOMECHANICALS) ×1 IMPLANT
TROCAR XCEL 12X100 BLDLESS (ENDOMECHANICALS) ×2 IMPLANT
WATER STERILE IRR 1000ML POUR (IV SOLUTION) ×3 IMPLANT

## 2021-08-28 NOTE — Anesthesia Postprocedure Evaluation (Signed)
Anesthesia Post Note  Patient: Mathew Leach  Procedure(s) Performed: XI ROBOTIC ASSISTED PARTIAL NEPHRECTOMY AND RENAL CYST DECORTICATION WITH INTRAOPERTATIVE ULTRASOUND (Right)     Patient location during evaluation: PACU Anesthesia Type: General Level of consciousness: awake and alert Pain management: pain level controlled Vital Signs Assessment: post-procedure vital signs reviewed and stable Respiratory status: spontaneous breathing, nonlabored ventilation, respiratory function stable and patient connected to nasal cannula oxygen Cardiovascular status: blood pressure returned to baseline and stable Postop Assessment: no apparent nausea or vomiting Anesthetic complications: no   No notable events documented.  Last Vitals:  Vitals:   08/28/21 1230 08/28/21 1245  BP: 130/72 129/77  Pulse: 83 84  Resp: 18 19  Temp:    SpO2: 100% 100%    Last Pain:  Vitals:   08/28/21 1230  TempSrc:   PainSc: Asleep                 Janith Nielson S

## 2021-08-28 NOTE — Brief Op Note (Signed)
08/28/2021  11:55 AM  PATIENT:  Mathew Leach  70 y.o. male  PRE-OPERATIVE DIAGNOSIS:  RIGHT RENAL MASS, RIGHT RENAL CYST  POST-OPERATIVE DIAGNOSIS:  RIGHT RENAL MASS, RIGHT RENAL CYST  PROCEDURE:  Procedure(s) with comments: XI ROBOTIC ASSISTED PARTIAL NEPHRECTOMY AND RENAL CYST DECORTICATION WITH INTRAOPERTATIVE ULTRASOUND (Right) - 3 HRS  SURGEON:  Surgeon(s) and Role:    Alexis Frock, MD - Primary  PHYSICIAN ASSISTANT:   ASSISTANTS: Debbrah Alar PA  ANESTHESIA:   local and general  EBL:  100 mL   BLOOD ADMINISTERED:none  DRAINS: none JP to bulb  LOCAL MEDICATIONS USED:  MARCAINE     SPECIMEN:  Source of Specimen:  - Right renal cyst wall; 2 - Right Renal Mass  DISPOSITION OF SPECIMEN:  PATHOLOGY  COUNTS:  YES  TOURNIQUET:  * No tourniquets in log *  DICTATION: .Other Dictation: Dictation Number [id number]  PLAN OF CARE: Admit for overnight observation  PATIENT DISPOSITION:  PACU - hemodynamically stable.   Delay start of Pharmacological VTE agent (>24hrs) due to surgical blood loss or risk of bleeding: yes

## 2021-08-28 NOTE — Discharge Instructions (Signed)

## 2021-08-28 NOTE — H&P (Signed)
Mathew Leach is an 70 y.o. male.    Chief Complaint: Pre-Op RIGHT robotic partial nephrectomy / cyst decortication  HPI:   1 - Right Cystic Renal Mass - approx 5.5 cm Rt mid cystic renal mass by MRI 2021 and 2022. Abuts massive non-complex lower cyst. 80% exophytic, 1 artery / 1 vein right (lower pole accessor) renovascular anatomy. Appears progressive.   2 - Bilateral Numerous Renal Cysts / Polycystic Kidney Disease - numerous bilateral non-complex cysts of varying size, dominant RLP 14cm. Only one complex as per above. [place] ESRD. Cr <1.5 x many.   PMH sig for GBM (crani, XRT, chemo (completed), follows [facility] and [facility]), arrtyhmia / (s/p Watchman, no blood thinners, follows Shirlee More cards), His PCP is Maryella Shivers MD. 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 His wife [name] is very involved as well.   Today "Mathew Leach" is seen to proceed with RIGHT partial nephrectomy / cyst decortication.   Past Medical History:  Diagnosis Date   Actinic keratosis    scalp   Arthritis    ASD (atrial septal defect) 06/10/2015   Atrial flutter, paroxysmal ([place]) 08/01/2015   Cancer of right kidney ([place]) 06/05/2021   Chronic anticoagulation 08/29/2015   Chronic pain of right knee 12/07/2014   Dysplastic nevus 01/11/2015   upper back spinal   Dysplastic nevus 01/29/2017   right superior calf inferior to popliteal   Dysrhythmia    AFIB   Goals of care, counseling/discussion 09/17/2020   Hypertension    Kidney stones    Obstructive sleep apnea syndrome 06/26/2015   PAF (paroxysmal atrial fibrillation) ([place]) 07/30/2015   Pericarditis    1999   PFO (patent foramen ovale) 06/16/2018   Preoperative cardiovascular examination 02/03/2016   S/P TKR (total knee replacement) using cement, 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   Shortness of breath dyspnea    W/ EXERTION    Sleep apnea    CPAP   Stroke ([place])    04-2015    Past  Surgical History:  Procedure Laterality Date   APPLICATION OF CRANIAL NAVIGATION Left 09/18/2020   Procedure: APPLICATION OF CRANIAL NAVIGATION;  Surgeon: Consuella Lose, MD;  Location: [place];  Service: Neurosurgery;  Laterality: Left;   COLONOSCOPY  04/10/2011   Moderate predominantly sigmoid diverticulosis. Small internal hemorrhoids.    COLONOSCOPY  01/30/2020   CRANIOTOMY Left 09/18/2020   Procedure: LEFT FRONTAL CRANIOTOMY FOR  TUMOR;  Surgeon: Consuella Lose, MD;  Location: [place];  Service: Neurosurgery;  Laterality: Left;  left   CYST REMOVAL NECK     AGE 1   ESOPHAGOGASTRODUODENOSCOPY  04/10/2011   Erosive esophagitis with esophageal stricture (asymptomatic strictures since the patient not having any dysphagia). LA grade D esophagitis.   INGUINAL HERNIA REPAIR     LEFT AS CHILD   TOTAL KNEE ARTHROPLASTY Right 03/17/2016   Procedure: TOTAL KNEE ARTHROPLASTY;  Surgeon: [name], MD;  Location: [place];  Service: Orthopedics;  Laterality: Right;   watchman procedure  07/2019   no longer needs Eliquis    Family History  Problem Relation Age of Onset   Lung cancer Father    Colon cancer Neg Hx    Esophageal cancer Neg Hx    Colon polyps Neg Hx    Rectal cancer Neg Hx    Stomach cancer Neg Hx    Social History:  reports that he has never smoked. He has never used smokeless tobacco.  He reports that he does not drink alcohol and does not use drugs.  Allergies:  Allergies  Allergen Reactions   Lipitor [Atorvastatin] Other (See Comments)    Caused DOUBLE VISION     Medications Prior to Admission  Medication Sig Dispense Refill   levETIRAcetam (KEPPRA) 1000 MG tablet Take 1,000 mg by mouth 2 (two) times daily.     simvastatin (ZOCOR) 20 MG tablet Take 1 tablet (20 mg total) by mouth daily. (Patient taking differently: Take 20 mg by mouth daily with lunch.) 90 tablet 3   loperamide (IMODIUM A-D) 2 MG capsule Take 2 mg by mouth daily as needed for diarrhea or loose  stools.      No results found for this or any previous visit (from the past 48 hour(s)). No results found.  Review of Systems  Constitutional:  Negative for chills and fever.  All other systems reviewed and are negative.  Blood pressure 131/74, pulse 69, temperature 98.7 F (37.1 C), temperature source Oral, resp. rate 18, SpO2 99 %. Physical Exam Vitals reviewed.  HENT:     Head: Normocephalic.  Eyes:     Pupils: Pupils are equal, round, and reactive to light.  Cardiovascular:     Rate and Rhythm: Normal rate.  Abdominal:     General: Abdomen is flat.  Genitourinary:    Comments: No CVAT Musculoskeletal:        General: Normal range of motion.     Cervical back: Normal range of motion.  Neurological:     General: No focal deficit present.     Mental Status: He is alert.  Psychiatric:        Mood and Affect: Mood normal.     Assessment/Plan  Proceed as planned with RIGHT robotic partial nephrectomy / cyst decortication. Risks, benefits, alternatives, expected peri-op course discussed previously and reiterated today.   Alexis Frock, MD 08/28/2021, 7:08 AM

## 2021-08-28 NOTE — Anesthesia Procedure Notes (Signed)
Procedure Name: Intubation Date/Time: 08/28/2021 8:28 AM Performed by: Maxwell Caul, CRNA Pre-anesthesia Checklist: Patient identified, Emergency Drugs available, Suction available and Patient being monitored Patient Re-evaluated:Patient Re-evaluated prior to induction Oxygen Delivery Method: Circle system utilized Preoxygenation: Pre-oxygenation with 100% oxygen Induction Type: IV induction Ventilation: Mask ventilation without difficulty Laryngoscope Size: Mac and 4 Grade View: Grade I Tube type: Oral Tube size: 7.5 mm Number of attempts: 1 Airway Equipment and Method: Stylet Placement Confirmation: ETT inserted through vocal cords under direct vision, positive ETCO2 and breath sounds checked- equal and bilateral Secured at: 21 cm Tube secured with: Tape Dental Injury: Teeth and Oropharynx as per pre-operative assessment

## 2021-08-28 NOTE — Transfer of Care (Signed)
Immediate Anesthesia Transfer of Care Note  Patient: Mathew Leach  Procedure(s) Performed: XI ROBOTIC ASSISTED PARTIAL NEPHRECTOMY AND RENAL CYST DECORTICATION WITH INTRAOPERTATIVE ULTRASOUND (Right)  Patient Location: PACU  Anesthesia Type:General  Level of Consciousness: awake, alert  and oriented  Airway & Oxygen Therapy: Patient Spontanous Breathing and Patient connected to face mask oxygen  Post-op Assessment: Report given to RN and Post -op Vital signs reviewed and stable  Post vital signs: Reviewed and stable  Last Vitals:  Vitals Value Taken Time  BP 133/77 08/28/21 1207  Temp    Pulse 89 08/28/21 1211  Resp 17 08/28/21 1211  SpO2 100 % 08/28/21 1211  Vitals shown include unvalidated device data.  Last Pain:  Vitals:   08/28/21 0643  TempSrc: Oral  PainSc:          Complications: No notable events documented.

## 2021-08-29 ENCOUNTER — Encounter (HOSPITAL_COMMUNITY): Payer: Self-pay | Admitting: Urology

## 2021-08-29 DIAGNOSIS — Z96651 Presence of right artificial knee joint: Secondary | ICD-10-CM | POA: Diagnosis not present

## 2021-08-29 DIAGNOSIS — I480 Paroxysmal atrial fibrillation: Secondary | ICD-10-CM | POA: Diagnosis not present

## 2021-08-29 DIAGNOSIS — Z7901 Long term (current) use of anticoagulants: Secondary | ICD-10-CM | POA: Diagnosis not present

## 2021-08-29 DIAGNOSIS — I10 Essential (primary) hypertension: Secondary | ICD-10-CM | POA: Diagnosis not present

## 2021-08-29 DIAGNOSIS — Q6101 Congenital single renal cyst: Secondary | ICD-10-CM | POA: Diagnosis not present

## 2021-08-29 DIAGNOSIS — C641 Malignant neoplasm of right kidney, except renal pelvis: Secondary | ICD-10-CM | POA: Diagnosis not present

## 2021-08-29 LAB — CREATININE, FLUID (PLEURAL, PERITONEAL, JP DRAINAGE): Creat, Fluid: 1.2 mg/dL

## 2021-08-29 LAB — BASIC METABOLIC PANEL
Anion gap: 7 (ref 5–15)
BUN: 16 mg/dL (ref 8–23)
CO2: 24 mmol/L (ref 22–32)
Calcium: 9.1 mg/dL (ref 8.9–10.3)
Chloride: 112 mmol/L — ABNORMAL HIGH (ref 98–111)
Creatinine, Ser: 1.28 mg/dL — ABNORMAL HIGH (ref 0.61–1.24)
GFR, Estimated: 59 mL/min — ABNORMAL LOW (ref >60)
Glucose, Bld: 164 mg/dL — ABNORMAL HIGH (ref 70–99)
Potassium: 3.6 mmol/L (ref 3.5–5.1)
Sodium: 143 mmol/L (ref 135–145)

## 2021-08-29 LAB — HEMOGLOBIN AND HEMATOCRIT, BLOOD
HCT: 41.1 % (ref 39.0–52.0)
Hemoglobin: 13.7 g/dL (ref 13.0–17.0)

## 2021-08-29 MED ORDER — CHLORHEXIDINE GLUCONATE CLOTH 2 % EX PADS: 6.0000 | MEDICATED_PAD | Freq: Every day | CUTANEOUS | Status: DC

## 2021-08-29 NOTE — Op Note (Signed)
NAME: [name], [name]. MEDICAL RECORD NO: [id number] ACCOUNT NO: [id number] DATE OF BIRTH: [birth year] FACILITY: [facility] LOCATION: [id number] PHYSICIAN: Alexis Frock, MD  Operative Report   DATE OF PROCEDURE: 08/28/2021  PREOPERATIVE DIAGNOSIS:  Large right renal cyst and right renal mass.  PROCEDURE PERFORMED:   1.  Right robotic partial nephrectomy. 2.  Right robotic renal cyst decortication. 3.  Intraoperative ultrasound with interpretation.  ESTIMATED BLOOD LOSS: 100 mL.  COMPLICATIONS:  None.  SPECIMENS: 1.  Right renal cyst wall for pathology. 2. Right renal mass for pathology.  ISCHEMIA: Warm ischemia time, 18 minutes.  FINDINGS:   1.  Single artery two vein, right renal vascular anatomy has dissipated. 2.  Very large right lower pole simple-appearing renal cyst. 3.  Fairly large right multilocular renal cystic mass.  DRAINS:    1.  Jackson-Pratt drain to bulb suction. 2.  Foley to gravity drain.  ASSISTANT:  Debbrah Alar, PA  INDICATIONS FOR PROCEDURE:  Mathew Leach is a pleasant 70 year old man with a history of brain tumor that is remarkably stable and indolent.  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  Options were discussed for management including surveillance protocols versus surgical extirpation versus ablative therapies and the patient was to proceed with right partial nephrectomy with curative  intent.  The mass abuts a very, very large right lower pole renal cyst, greater than 10 cm. As such, addressing the cyst would be necessary in order to address the mass.  As such, after discussion also with the patient's medical oncologist who verified  that it is very indolent and unstable neurologic mass, so that the right partial nephrectomy with renal cyst decortication most prudent.  Informed consent was then placed in the medical  record.  DESCRIPTION OF PROCEDURE:  The patient being Mathew Leach was verified and the procedure being a right partial nephrectomy with renal cyst decortication was confirmed. Procedure timeout was performed.  Intravenous antibiotics were administered.  General  endotracheal anesthesia induced.  The patient was placed in the right side up full flank position using 15 degrees of table flexion, superior arm elevator, axillary roll  compression device  Foley catheter was placed to straight drain. He was further fashioned on the  operating table using 3-inch tape over foam padding across the supra xiphoid chest and his pelvis. A sterile field was created by first clipping and shaving and prepping and draping the patient's right flank using glycine gluconate and high flow, low  pressure. Pneumoperitoneum was obtained using Verress technique right lower quadrant, having passed the aspiration drop test.  Next, an 8 mm robotic camera port was placed in position approximately 1 handbreadth superior lateral to the umbilicus.  Laparoscope was then used  in the peritoneal cavity.  No significant adhesions and no visceral injury.  Additional ports were placed as follows:  Right subcostal 8 mm robotic port, right far lateral  8 mm robotic port, approximately 4 fingerbreadths superior medial to the anterior superior spine, right  paramedian inferior robotic port, approximately 1 handbreadth superior to the pubic ramus.  Two 12 mm assistant port sites in the midline, one approximately 2 fingerbreadths superior to the glenohumeral port, one 2 fingerbreadths inferior. The superior  most one being AirSeal type and then a 5 mm subxiphoid port through which a self-locking  grasper was used to raise the liver edge and thus performed liver retraction.  Robot was then docked and passed the electronic checks.  Next, attention was turned  to the development of the retroperitoneum. Incision was made lateral to the ascending colon  from the cecum towards the hepatic flexure and the colon was carefully swept medially.  There was a very large mass effect from a simple-appearing lower pole  renal cyst.  There was some mild desmoplasia around this. The lower pole cyst area was very carefully dissected free, such that the wall was free on its external surface inferiorly, laterally and then medially. The lower pole kidney and cyst area was  placed in gentle lateral traction.  Dissection proceeded medial to this.  The ureter was encountered and also placed on gentle lateral traction and  the vessels were allowed to fall medially.  Dissection proceeded within this triangle towards the  renal hilum.  The renal hilum was somewhat complex consisting of an early branching renal artery and two vein neurovascular anatomy, lower pole vein and accessory. A very careful circumferential mobilization was performed of these structures and vessel  loop was applied to the artery trunk.  Attention was directed to identification of the right renal mass and at the superior border of the large cyst area.  The area of presumed mass was grossly seen.  However, this was somewhat difficult to appreciate.   Therefore, intraoperative exam was performed to ensure and verified. Using a drop down probe, intraoperative film revealed a heterogeneous combination of cystic solid mass approximately 6 cm in diameter at the superior most aspect of the large minimally  complex renal cyst.  The area of the mass and cyst interface was carefully dissected free as was the parenchymal and mass interface.  The cyst wall was then purposely incised and drained of copious simple straw-colored fluid and the cyst wall was  completely excised at its parenchymal interface circumferentially.  This relatively large specimen was placed into an EndoCatch bag for later retrieval labeled cyst wall.    Intraoperative ultrasound was then once again performed and thus better visualizing the  anterior and inferior aspects of the mass and demarcating plane of partial nephrectomy using a combination of gross and ultrasound cues to achieve a goal of negative  margins.  Warm ischemia was then achieved.  We placed a bulldog clamp on the artery and partial nephrectomy was carefully performed.  appeared to be a rim of normal parenchymal tissue with the mass at the site of the dissection.  This was set aside  for later retrieval. 1st layer rhenorraphy was performed using a running 3-0 V-Loc suture, oversewing several small venous sinuses.  A bolster was applied with Surgicel and then three parenchymal apposition sutures of Vicryl sanwisched between Hem-o-Lok and Lapra-Tys.  There was excellent  hemostasis. The bulldog clamp on the artery was removed.  Total warm ischemia time of 18 minutes.  The previous renorrhaphy sutures were removed.  Sponge and needle counts were correct.  Hemostasis was quite good.  FloSeal was applied to the area of kidney repair.  This loop was removed off the artery.  Given the very large amount of retroperitoneal  tissue removed with the mass and the large cyst, the kidney was inherently quite mobile.  It was then pexed to the inferior lateral abdominal wall using running Vicryl  to prevent excessive kidney mobilization and rotation at this point.  Again,  sponge and needle counts were correct.  Hemostasis was excellent.  We achieved the  extirpative portion of the surgery today.  Closed  suction drain was brought to the pubis lateral most port site into the peritoneal cavity and robot was then undocked.  The  specimens were retrieved by connecting the 2 previous 12 mm port sites in the midline, and removing the right renal mass specimen  for permanent pathology as well as the right renal cyst wall separately.  Extraction site was then closed with fascia  using 3-0 PDS x 5 followed by reapproximation of Scarpa's with running Vicryl.  All incision sites were infiltrated with  dilute lipolyzed Marcaine and closed at the level of the skin using subcuticular Monocryl followed by Dermabond.  The procedure was  then terminated.  The patient tolerated the procedure well.  No immediate complications.  The patient was taken to postanesthesia care unit in stable condition  Plan for observation admission.  Please note, first assistant, Bari Mantis, was crucial for all portions of the surgery today.  She provided invaluable retraction, suctioning, vascular clamping, specimen manipulation and general first assistance.   MUK D: 08/28/2021 12:05:49 pm T: 08/28/2021 11:07:00 pm  JOB: [id number]/ [id number]

## 2021-08-29 NOTE — Plan of Care (Signed)
  Problem: Education: Goal: Knowledge of General Education information will improve Description: Including pain rating scale, medication(s)/side effects and non-pharmacologic comfort measures Outcome: Completed/Met   Problem: Health Behavior/Discharge Planning: Goal: Ability to manage health-related needs will improve Outcome: Completed/Met   Problem: Clinical Measurements: Goal: Ability to maintain clinical measurements within normal limits will improve Outcome: Completed/Met Goal: Will remain free from infection Outcome: Completed/Met Goal: Diagnostic test results will improve Outcome: Completed/Met Goal: Respiratory complications will improve Outcome: Completed/Met Goal: Cardiovascular complication will be avoided Outcome: Completed/Met   Problem: Activity: Goal: Risk for activity intolerance will decrease Outcome: Completed/Met   Problem: Nutrition: Goal: Adequate nutrition will be maintained Outcome: Completed/Met   Problem: Coping: Goal: Level of anxiety will decrease Outcome: Completed/Met   Problem: Elimination: Goal: Will not experience complications related to bowel motility Outcome: Completed/Met Goal: Will not experience complications related to urinary retention Outcome: Completed/Met   Problem: Pain Managment: Goal: General experience of comfort will improve Outcome: Completed/Met   Problem: Safety: Goal: Ability to remain free from injury will improve Outcome: Completed/Met   Problem: Skin Integrity: Goal: Risk for impaired skin integrity will decrease Outcome: Completed/Met   Problem: Education: Goal: Knowledge of the prescribed therapeutic regimen will improve Outcome: Completed/Met   Problem: Bowel/Gastric: Goal: Gastrointestinal status for postoperative course will improve Outcome: Completed/Met   Problem: Clinical Measurements: Goal: Postoperative complications will be avoided or minimized Outcome: Completed/Met   Problem:  Respiratory: Goal: Ability to achieve and maintain a regular respiratory rate will improve Outcome: Completed/Met   Problem: Skin Integrity: Goal: Demonstration of wound healing without infection will improve Outcome: Completed/Met   Problem: Urinary Elimination: Goal: Ability to avoid or minimize complications of infection will improve Outcome: Completed/Met Goal: Ability to achieve and maintain urine output will improve Outcome: Completed/Met   

## 2021-08-29 NOTE — Progress Notes (Signed)
Pt discharged home today per Dr. Tresa Moore. Pt's IV site D/C'd and WDL. Pt's VSS. Pt provided with home medication list, discharge instructions and prescriptions. Verbalized understanding. Pt left floor via WC in stable condition accompanied by NT.

## 2021-08-29 NOTE — Discharge Summary (Signed)
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   AKI (acute kidney injury) ([place]) 34/19/6222   Metabolic acidosis 97/98/9211   Sleep apnea    Pericarditis    Kidney stones    Hypertension    Dysrhythmia    Arthritis    Actinic keratosis    Hematochezia 01/12/2020   Diarrhea 01/12/2020   PFO (patent foramen ovale) 06/16/2018   Hypertensive heart disease 06/24/2017   Dyslipidemia 06/24/2017   S/P TKR (total knee replacement) using cement, 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   Preoperative cardiovascular examination 02/03/2016   Chronic anticoagulation 08/29/2015   Atrial flutter, paroxysmal ([place]) 08/01/2015   PAF (paroxysmal atrial fibrillation) ([place]) 07/30/2015   Obstructive sleep apnea syndrome 06/26/2015   ASD (atrial septal defect) 06/10/2015   Stroke ([place]) 06/10/2015   Dysplastic nevus 01/11/2015   Chronic pain of right knee 12/07/2014    Procedures performed: Procedure(s): XI ROBOTIC ASSISTED PARTIAL NEPHRECTOMY AND RENAL CYST DECORTICATION WITH INTRAOPERTATIVE ULTRASOUND  History and Physical: For full details, please see admission history and physical. Briefly, Mathew Leach is a 70 y.o. year old patient with a Hx of a right cystic renal mass, approx 5.5 cm w/ progressive growth since 2021 and polycystic kidney disease resulting in numerous bilateral renal cysts including a large RLP 14 cm simple cyst abutting his right complex cystic renal mass. He was seen in clinic and options were discussed. Planned for RIGHT partial nephrectomy w/ right simple cyst  decortication which he underwent on 08/28/2021.   Physical Exam:  General: Alert and oriented CV: Regular rate Lungs: No increased work of breathing Abdomen: Soft, appropriately tender. Incisions c/d/i. Ext: NT, No erythema  Hospital Course: Patient tolerated the procedure well.  He was then transferred to the floor after an uneventful PACU stay.  His hospital course was uncomplicated.  On POD#1 he had met discharge criteria: was eating a regular diet, was up and ambulating independently,  pain was well controlled, was voiding without a catheter, and was ready to for discharge.  Laboratory values:  Recent Labs    08/28/21 1227 08/29/21 0421  HGB 14.7 13.7  HCT 43.3 41.1   Recent Labs    08/29/21 0421  NA 143  K 3.6  CL 112*  CO2 24  GLUCOSE 164*  BUN 16  CREATININE 1.28*  CALCIUM 9.1   No results for input(s): LABPT, INR in the last 72 hours. No results for input(s): LABURIN in the last 72 hours. Results for orders placed or performed in visit on 08/26/21  SARS Coronavirus 2 (TAT 6-24 hrs)     Status: None   Collection Time: 08/26/21 12:00 AM  Result Value Ref Range Status   SARS Coronavirus 2 RESULT: NEGATIVE  Final    Comment: RESULT: NEGATIVESARS-CoV-2 INTERPRETATION:A NEGATIVE  test result means that SARS-CoV-2 RNA was not present in the specimen above the limit of detection of this test. This does not preclude a possible SARS-CoV-2 infection and should not be used as the  sole basis for patient management decisions. Negative results must be combined with clinical observations, patient history, and epidemiological information. Optimum  specimen types and timing for peak viral levels during infections caused by SARS-CoV-2  have not been determined. Collection of multiple specimens or types of specimens may be necessary to detect virus. Improper specimen collection and handling, sequence variability under primers/probes, or organism present below the limit of detection may   lead to false negative results. Positive and negative predictive values of testing are highly dependent on prevalence. False negative test results are more likely when prevalence of disease is high.The expected result is NEGATIVE.Fact S heet for  Healthcare Providers: LocalChronicle.no Sheet for Patients: SalonLookup.es Reference Range - Negative     Disposition: Home  Discharge instruction: The patient was instructed to be ambulatory but told to refrain from heavy lifting, strenuous activity, or driving.  Discharge medications:  Allergies as of 08/29/2021       Reactions   Lipitor [atorvastatin] Other (See Comments)   Caused DOUBLE VISION        Medication List     TAKE these medications    docusate sodium 100 MG capsule Commonly known as: COLACE Take 1 capsule (100 mg total) by mouth 2 (two) times daily.   Imodium A-D 2 MG capsule Generic drug: loperamide Take 2 mg by mouth daily as needed for diarrhea or loose stools.   levETIRAcetam 1000 MG tablet Commonly known as: KEPPRA Take 1,000 mg by mouth 2 (two) times daily.   simvastatin 20 MG tablet Commonly known as: ZOCOR Take 1 tablet (20 mg total) by mouth daily. What changed: when to take this   traMADol 50 MG tablet Commonly known as: Ultram Take 1-2 tablets (50-100 mg total) by mouth every 6 (six) hours as needed for moderate pain or severe pain.        Followup:   Follow-up Information     Alexis Frock, MD Follow up on 09/12/2021.   Specialty: Urology Why: at 11:30 for MD visit and pathology review Contact information: [place] [address] [phone]

## 2021-08-30 ENCOUNTER — Other Ambulatory Visit: Payer: Self-pay | Admitting: Radiation Therapy

## 2021-09-01 LAB — TYPE AND SCREEN
ABO/RH(D): O POS
Antibody Screen: POSITIVE
DAT, IgG: POSITIVE
Unit division: 0

## 2021-09-01 LAB — BPAM RBC
Blood Product Expiration Date: 202211102359
Unit Type and Rh: 5100

## 2021-09-01 LAB — SURGICAL PATHOLOGY

## 2021-09-12 DIAGNOSIS — D49511 Neoplasm of unspecified behavior of right kidney: Secondary | ICD-10-CM | POA: Diagnosis not present

## 2021-09-19 ENCOUNTER — Encounter: Payer: Self-pay | Admitting: Hematology & Oncology

## 2021-09-19 ENCOUNTER — Inpatient Hospital Stay: Payer: Medicare Other | Attending: Internal Medicine

## 2021-09-19 ENCOUNTER — Other Ambulatory Visit: Payer: Self-pay

## 2021-09-19 ENCOUNTER — Inpatient Hospital Stay (HOSPITAL_BASED_OUTPATIENT_CLINIC_OR_DEPARTMENT_OTHER): Payer: Medicare Other | Admitting: Hematology & Oncology

## 2021-09-19 VITALS — BP 115/65 | HR 72 | Temp 97.9°F | Resp 18 | Ht 68.0 in | Wt 158.1 lb

## 2021-09-19 DIAGNOSIS — C711 Malignant neoplasm of frontal lobe: Secondary | ICD-10-CM | POA: Insufficient documentation

## 2021-09-19 DIAGNOSIS — Z905 Acquired absence of kidney: Secondary | ICD-10-CM | POA: Insufficient documentation

## 2021-09-19 DIAGNOSIS — C641 Malignant neoplasm of right kidney, except renal pelvis: Secondary | ICD-10-CM | POA: Diagnosis not present

## 2021-09-19 DIAGNOSIS — N2889 Other specified disorders of kidney and ureter: Secondary | ICD-10-CM

## 2021-09-19 DIAGNOSIS — Z923 Personal history of irradiation: Secondary | ICD-10-CM | POA: Insufficient documentation

## 2021-09-19 LAB — CBC WITH DIFFERENTIAL (CANCER CENTER ONLY)
Abs Immature Granulocytes: 0.04 10*3/uL (ref 0.00–0.07)
Basophils Absolute: 0.1 10*3/uL (ref 0.0–0.1)
Basophils Relative: 2 %
Eosinophils Absolute: 0.2 10*3/uL (ref 0.0–0.5)
Eosinophils Relative: 4 %
HCT: 42.1 % (ref 39.0–52.0)
Hemoglobin: 13.7 g/dL (ref 13.0–17.0)
Immature Granulocytes: 1 %
Lymphocytes Relative: 20 %
Lymphs Abs: 1.4 10*3/uL (ref 0.7–4.0)
MCH: 30.2 pg (ref 26.0–34.0)
MCHC: 32.5 g/dL (ref 30.0–36.0)
MCV: 92.7 fL (ref 80.0–100.0)
Monocytes Absolute: 0.5 10*3/uL (ref 0.1–1.0)
Monocytes Relative: 7 %
Neutro Abs: 4.6 10*3/uL (ref 1.7–7.7)
Neutrophils Relative %: 66 %
Platelet Count: 302 10*3/uL (ref 150–400)
RBC: 4.54 MIL/uL (ref 4.22–5.81)
RDW: 12.6 % (ref 11.5–15.5)
WBC Count: 6.9 10*3/uL (ref 4.0–10.5)
nRBC: 0 % (ref 0.0–0.2)

## 2021-09-19 LAB — CMP (CANCER CENTER ONLY)
ALT: 15 U/L (ref 0–44)
AST: 16 U/L (ref 15–41)
Albumin: 4.1 g/dL (ref 3.5–5.0)
Alkaline Phosphatase: 96 U/L (ref 38–126)
Anion gap: 8 (ref 5–15)
BUN: 13 mg/dL (ref 8–23)
CO2: 29 mmol/L (ref 22–32)
Calcium: 9.5 mg/dL (ref 8.9–10.3)
Chloride: 104 mmol/L (ref 98–111)
Creatinine: 1.2 mg/dL (ref 0.61–1.24)
GFR, Estimated: >60 mL/min (ref >60)
Glucose, Bld: 203 mg/dL — ABNORMAL HIGH (ref 70–99)
Potassium: 4.1 mmol/L (ref 3.5–5.1)
Sodium: 141 mmol/L (ref 135–145)
Total Bilirubin: 0.5 mg/dL (ref 0.3–1.2)
Total Protein: 6.5 g/dL (ref 6.5–8.1)

## 2021-09-19 LAB — LACTATE DEHYDROGENASE: LDH: 138 U/L (ref 98–192)

## 2021-09-19 NOTE — Progress Notes (Signed)
Hematology and Oncology Follow Up Visit  Mathew Leach [id number] 03/25/1951 70 y.o. 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  He did have his partial right nephrectomy for the kidney cyst.  He did well with this.  Dr. Tresa Moore did a fantastic job.  A partialWL nephrectomy was done with robotic assistance.  The pathology report ([id number]) showed a 5.4 cm papillary renal cell carcinoma.  It was type I, nuclear grade 2.  All margins were negative.  As such, this was a stage I (T1bNxM0) tumor.  There is no indication that he needs any adjuvant therapy for this.  He is also done incredibly well with his glioblastoma of the brain.  He sees Dr. Mickeal Skinner at the [facility] for this.  I think Dr. Mickeal Skinner has followed him with MRIs.  Mathew Leach has had no problems with cough or shortness of breath.  He has had no issues with bleeding.  He has had no change in bowel or bladder habits.  He has had no leg swelling.  There has been no rashes.  I would have to say that his performance status right now is ECOG 1.    Medications:  Current Outpatient Medications:    amLODipine (NORVASC) 10 MG tablet, Take 1 tablet by mouth daily., Disp: , Rfl:    levETIRAcetam (KEPPRA) 1000 MG tablet, Take 1,000 mg by mouth 2 (two) times daily., Disp: , Rfl:    loperamide (IMODIUM) 2 MG capsule, Take 2 mg by mouth daily as needed for diarrhea or loose stools., Disp: , Rfl:    simvastatin (ZOCOR) 20 MG tablet, Take 1 tablet (20 mg total) by mouth daily. (Patient taking differently: Take 20 mg by mouth daily with lunch.), Disp: 90 tablet, Rfl: 3  Allergies:  Allergies  Allergen  Reactions   Lipitor [Atorvastatin] Other (See Comments)    Caused DOUBLE VISION     Past Medical History, Surgical history, Social history, and Family History were reviewed and updated.  Review of Systems: Review of Systems  Constitutional: Negative.   HENT:  Negative.    Eyes: Negative.   Respiratory: Negative.    Cardiovascular: Negative.   Gastrointestinal: Negative.   Endocrine: Negative.   Genitourinary: Negative.    Musculoskeletal: Negative.   Skin: Negative.   Neurological: Negative.   Hematological: Negative.   Psychiatric/Behavioral: Negative.     Physical Exam:  height is 5\' 8"  (1.727 m) and weight is 158 lb 1.9 oz (71.7 kg). His oral temperature is 97.9 F (36.6 C). His blood pressure is 115/65 and his pulse is 72. His respiration is 18 and oxygen saturation is 100%.   Wt Readings from Last 3 Encounters:  09/19/21 158 lb 1.9 oz (71.7 kg)  08/29/21 158 lb 11.7 oz (72 kg)  08/26/21 164 lb 9.6 oz (74.7 kg)    Physical Exam Vitals reviewed.  HENT:     Head: Normocephalic and atraumatic.     Comments: Head exam shows the craniotomy scar in the left frontoparietal region.  This is well-healed. Eyes:     Pupils: Pupils are equal, round, and reactive to light.  Cardiovascular:     Rate and Rhythm: Normal rate and regular rhythm.  Heart sounds: Normal heart sounds.  Pulmonary:     Effort: Pulmonary effort is normal.     Breath sounds: Normal breath sounds.  Abdominal:     General: Bowel sounds are normal.     Palpations: Abdomen is soft.     Comments: Abdominal exam shows well-healed laparoscopic scars.  He has a larger incision in the midline.  The other 3 small incisions are well-healed.  There is no fluid wave.  He has no palpable abdominal mass.  There is no palpable liver or spleen tip.  Musculoskeletal:        General: No tenderness or deformity. Normal range of motion.     Cervical back: Normal range of motion.  Lymphadenopathy:     Cervical: No  cervical adenopathy.  Skin:    General: Skin is warm and dry.     Findings: No erythema or rash.  Neurological:     Mental Status: He is alert and oriented to person, place, and time.  Psychiatric:        Behavior: Behavior normal.        Thought Content: Thought content normal.        Judgment: Judgment normal.   Lab Results  Component Value Date   WBC 6.9 09/19/2021   HGB 13.7 09/19/2021   HCT 42.1 09/19/2021   MCV 92.7 09/19/2021   PLT 302 09/19/2021     Chemistry      Component Value Date/Time   NA 141 09/19/2021 1422   NA 143 02/13/2020 1037   K 4.1 09/19/2021 1422   CL 104 09/19/2021 1422   CO2 29 09/19/2021 1422   BUN 13 09/19/2021 1422   BUN 13 02/13/2020 1037   CREATININE 1.20 09/19/2021 1422      Component Value Date/Time   CALCIUM 9.5 09/19/2021 1422   ALKPHOS 96 09/19/2021 1422   AST 16 09/19/2021 1422   ALT 15 09/19/2021 1422   BILITOT 0.5 09/19/2021 1422      Impression and Plan: Mathew Leach is a very nice 70 year old white male.  He presented with a glioblastoma of the left frontal lobe.  This was resected.  He underwent adjuvant therapy with radiation and Temodar.  He then was placed on Temodar as a single agent.  He has completed this.   He subsequently has undergone a partial nephrectomy for a right renal cancer.  There is a papillary carcinoma.  It was stage I.  Hopefully, he will be cured of the papillary renal cell carcinoma.  As far as the glioblastoma is concerned, this is the one that can certainly recur.  Again Dr. Mickeal Skinner is doing a fantastic job watching over Mathew Leach.  I do want to see Mathew Leach back now until May.  Dr. Tresa Moore will have his CT scan done in April.  I am just happy that Mathew Leach has done so well.  His quality life is much much better than when we first saw him which was probably about a year ago.   Volanda Napoleon, MD 11/3/20223:58 PM

## 2021-09-26 ENCOUNTER — Ambulatory Visit (HOSPITAL_COMMUNITY)
Admission: RE | Admit: 2021-09-26 | Discharge: 2021-09-26 | Disposition: A | Discharge status: Home / Self Care | Payer: Medicare Other | Source: Ambulatory Visit | Attending: Internal Medicine | Admitting: Internal Medicine

## 2021-09-26 ENCOUNTER — Other Ambulatory Visit: Payer: Self-pay

## 2021-09-26 DIAGNOSIS — I639 Cerebral infarction, unspecified: Secondary | ICD-10-CM | POA: Diagnosis not present

## 2021-09-26 DIAGNOSIS — C711 Malignant neoplasm of frontal lobe: Secondary | ICD-10-CM | POA: Insufficient documentation

## 2021-09-26 DIAGNOSIS — C719 Malignant neoplasm of brain, unspecified: Secondary | ICD-10-CM | POA: Diagnosis not present

## 2021-09-26 MED ORDER — GADOBUTROL 1 MMOL/ML IV SOLN
7.0000 mL | Freq: Once | INTRAVENOUS | Status: AC | PRN
  Administered 2021-09-26: 7 mL via INTRAVENOUS

## 2021-10-03 ENCOUNTER — Other Ambulatory Visit: Payer: Self-pay

## 2021-10-03 ENCOUNTER — Telehealth: Payer: Self-pay | Admitting: Internal Medicine

## 2021-10-03 ENCOUNTER — Inpatient Hospital Stay (HOSPITAL_BASED_OUTPATIENT_CLINIC_OR_DEPARTMENT_OTHER): Payer: Medicare Other | Admitting: Internal Medicine

## 2021-10-03 VITALS — BP 131/69 | HR 73 | Temp 97.5°F | Resp 18 | Wt 161.1 lb

## 2021-10-03 DIAGNOSIS — Z923 Personal history of irradiation: Secondary | ICD-10-CM | POA: Diagnosis not present

## 2021-10-03 DIAGNOSIS — C711 Malignant neoplasm of frontal lobe: Secondary | ICD-10-CM | POA: Diagnosis not present

## 2021-10-03 DIAGNOSIS — Z905 Acquired absence of kidney: Secondary | ICD-10-CM | POA: Diagnosis not present

## 2021-10-03 DIAGNOSIS — C641 Malignant neoplasm of right kidney, except renal pelvis: Secondary | ICD-10-CM | POA: Diagnosis not present

## 2021-10-03 NOTE — Telephone Encounter (Signed)
Scheduled per 11/17 los, pt has been called and wife confirmed appt

## 2021-10-03 NOTE — Progress Notes (Signed)
[facility] [place], [place] [address] [phone]   Interval Evaluation  Date of Service: 10/03/21 Patient Name: Mathew Leach Patient MRN: [id number] Patient DOB: 1951-05-15 Provider: Ventura Sellers, MD  Identifying Statement:  [name] is a 70 y.o. male with left frontal glioblastoma   Referring Provider:  Oncologic History: Oncology History  Frontal glioblastoma multiforme ([facility])  09/18/2020 Surgery   Left frontal craniotomy, resection by Dr. Kathyrn Sheriff; path demonstrates Glioblastaoma IDH-wt   10/17/2020 - 10/17/2020 Chemotherapy    Patient is on Treatment Plan: BRAIN GLIOBLASTOMA POST XRT TEMOZOLOMIDE DAYS 1-5 Q28 DAYS        12/10/2020 -  Chemotherapy    Patient is on Treatment Plan: BRAIN GLIOBLASTOMA POST XRT TEMOZOLOMIDE DAYS 1-5 Q28 DAYS          Biomarkers:  MGMT Unknown.  IDH 1/2 Wild type.  EGFR Unknown  TERT Unknown   Interval History: Mathew Leach presents to clinic today, having recently obtained partial nephrectomy (08/28/21) for renal cell carcinoma.  No new or progressive neurologic deficits today. Otherwise remains independent with gait, basic ADLs, although memory is "a little slower".  Denies further seizures, no headaches.  H+P (10/04/20)  [name] presented to medical attention three weeks ago with new onset generalized seizure.  This occurred at work, unwitnessed, and then second event was witnessed by EMS described as generalized shaking.  CNS imaging demonstrated left frontal mass lesion, which was resected by Dr. Kathyrn Sheriff on 09/18/20.  Following surgery, he had no clinical complaints.  Workup did also demonstrate mass within kidney possibly c/w renal cell carcinoma.  He presents today for path review; he is fully independent for age, lives with his wife.   Medications: Current Outpatient Medications on File Prior to Visit  Medication Sig Dispense Refill    amLODipine (NORVASC) 10 MG tablet Take 1 tablet by mouth daily.     levETIRAcetam (KEPPRA) 1000 MG tablet Take 1,000 mg by mouth 2 (two) times daily.     loperamide (IMODIUM) 2 MG capsule Take 2 mg by mouth daily as needed for diarrhea or loose stools.     simvastatin (ZOCOR) 20 MG tablet Take 1 tablet (20 mg total) by mouth daily. (Patient taking differently: Take 20 mg by mouth daily with lunch.) 90 tablet 3   No current facility-administered medications on file prior to visit.    Allergies:  Allergies  Allergen Reactions   Lipitor [Atorvastatin] Other (See Comments)    Caused DOUBLE VISION    Past Medical History:  Past Medical History:  Diagnosis Date   Actinic keratosis    scalp   Arthritis    ASD (atrial septal defect) 06/10/2015   Atrial flutter, paroxysmal ([facility]) 08/01/2015   Cancer of right kidney ([facility]) 06/05/2021   Chronic anticoagulation 08/29/2015   Chronic pain of right knee 12/07/2014   Dysplastic nevus 01/11/2015   upper back spinal   Dysplastic nevus 01/29/2017   right superior calf inferior to popliteal   Dysrhythmia    AFIB   Goals of care, counseling/discussion 09/17/2020   Hypertension    Kidney stones    Obstructive sleep apnea syndrome 06/26/2015   PAF (paroxysmal atrial fibrillation) ([facility]) 07/30/2015   Pericarditis    1999   PFO (patent foramen ovale) 06/16/2018   Preoperative cardiovascular examination 02/03/2016   S/P TKR (total knee replacement) using cement, 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   Shortness of breath dyspnea    W/  EXERTION    Sleep apnea    CPAP   Stroke [facility])    04-2015   Past Surgical History:  Past Surgical History:  Procedure Laterality Date   APPLICATION OF CRANIAL NAVIGATION Left 09/18/2020   Procedure: APPLICATION OF CRANIAL NAVIGATION;  Surgeon: Consuella Lose, MD;  Location: [place];  Service: Neurosurgery;  Laterality: Left;   COLONOSCOPY  04/10/2011   Moderate predominantly sigmoid diverticulosis. Small internal hemorrhoids.     COLONOSCOPY  01/30/2020   CRANIOTOMY Left 09/18/2020   Procedure: LEFT FRONTAL CRANIOTOMY FOR  TUMOR;  Surgeon: Consuella Lose, MD;  Location: [place];  Service: Neurosurgery;  Laterality: Left;  left   CYST REMOVAL NECK     AGE 22   ESOPHAGOGASTRODUODENOSCOPY  04/10/2011   Erosive esophagitis with esophageal stricture (asymptomatic strictures since the patient not having any dysphagia). LA grade D esophagitis.   INGUINAL HERNIA REPAIR     LEFT AS CHILD   ROBOTIC ASSITED PARTIAL NEPHRECTOMY Right 08/28/2021   Procedure: XI ROBOTIC ASSISTED PARTIAL NEPHRECTOMY AND RENAL CYST DECORTICATION WITH INTRAOPERTATIVE ULTRASOUND;  Surgeon: Alexis Frock, MD;  Location: [facility];  Service: Urology;  Laterality: Right;  3 HRS   TOTAL KNEE ARTHROPLASTY Right 03/17/2016   Procedure: TOTAL KNEE ARTHROPLASTY;  Surgeon: [name], MD;  Location: [place];  Service: Orthopedics;  Laterality: Right;   watchman procedure  07/2019   no longer needs Eliquis   Social History:  Social History   Socioeconomic History   Marital status: Married    Spouse name: Not on file   Number of children: Not on file   Years of education: Not on file   Highest education level: Not on file  Occupational History   Not on file  Tobacco Use   Smoking status: Never   Smokeless tobacco: Never  Vaping Use   Vaping Use: Never used  Substance and Sexual Activity   Alcohol use: No   Drug use: No   Sexual activity: Not on file  Other Topics Concern   Not on file  Social History Narrative   Not on file   Social Determinants of Health   Financial Resource Strain: Not on file  Food Insecurity: Not on file  Transportation Needs: Not on file  Physical Activity: Not on file  Stress: Not on file  Social Connections: Not on file  Intimate Partner Violence: Not on file   Family History:  Family History  Problem Relation Age of Onset   Lung cancer Father    Colon cancer Neg Hx    Esophageal cancer Neg Hx    Colon  polyps Neg Hx    Rectal cancer Neg Hx    Stomach cancer Neg Hx     Review of Systems: Constitutional: Doesn't report fevers, chills or abnormal weight loss Eyes: Doesn't report blurriness of vision Ears, nose, mouth, throat, and face: Doesn't report sore throat Respiratory: Doesn't report cough, dyspnea or wheezes Cardiovascular: Doesn't report palpitation, chest discomfort  Gastrointestinal:  Doesn't report nausea, constipation, diarrhea GU: Doesn't report incontinence Skin: Doesn't report skin rashes Neurological: Per HPI Musculoskeletal: Doesn't report joint pain Behavioral/Psych: Doesn't report anxiety  Physical Exam: Vitals:   10/03/21 0950  BP: 131/69  Pulse: 73  Resp: 18  Temp: (!) 97.5 F (36.4 C)  SpO2: 100%   KPS: 80. General: Alert, cooperative, pleasant, in no acute distress Head: Normal EENT: No conjunctival injection or scleral icterus.  Lungs: Resp effort normal Cardiac: Regular rate Abdomen: Non-distended abdomen Skin: No rashes  cyanosis or petechiae. Extremities: No clubbing or edema  Neurologic Exam: Mental Status: Awake, alert, attentive to examiner. Oriented to self and environment. Language is fluent with intact comprehension.  Cranial Nerves: Visual acuity is grossly normal. Visual fields are full. Extra-ocular movements intact. No ptosis. Face is symmetric Motor: Tone and bulk are normal. Power is full in both arms and legs. Reflexes are symmetric, no pathologic reflexes present.  Sensory: Intact to light touch Gait: Normal.   Labs: I have reviewed the data as listed    Component Value Date/Time   NA 141 09/19/2021 1422   NA 143 02/13/2020 1037   K 4.1 09/19/2021 1422   CL 104 09/19/2021 1422   CO2 29 09/19/2021 1422   GLUCOSE 203 (H) 09/19/2021 1422   BUN 13 09/19/2021 1422   BUN 13 02/13/2020 1037   CREATININE 1.20 09/19/2021 1422   CALCIUM 9.5 09/19/2021 1422   PROT 6.5 09/19/2021 1422   PROT 6.7 02/13/2020 1037   ALBUMIN 4.1  09/19/2021 1422   ALBUMIN 4.3 02/13/2020 1037   AST 16 09/19/2021 1422   ALT 15 09/19/2021 1422   ALKPHOS 96 09/19/2021 1422   BILITOT 0.5 09/19/2021 1422   GFRNONAA >60 09/19/2021 0488   GFRAA DUPLICATE 89/16/9450 3888   Lab Results  Component Value Date   WBC 6.9 09/19/2021   NEUTROABS 4.6 09/19/2021   HGB 13.7 09/19/2021   HCT 42.1 09/19/2021   MCV 92.7 09/19/2021   PLT 302 09/19/2021   Imaging:  [facility] Clinician Interpretation: I have personally reviewed the CNS images as listed.  My interpretation, in the context of the patient's clinical presentation, is treatment effect vs true progression  MR BRAIN W WO CONTRAST  Result Date: 09/27/2021 CLINICAL DATA:  Brain/CNS neoplasm, assess treatment response. History of glioblastoma with resection. EXAM: MRI HEAD WITHOUT AND WITH CONTRAST TECHNIQUE: Multiplanar, multiecho pulse sequences of the brain and surrounding structures were obtained without and with intravenous contrast. CONTRAST:  79m GADAVIST GADOBUTROL 1 MMOL/ML IV SOLN COMPARISON:  06/21/2021 FINDINGS: Brain: Left frontal resection cavity with unchanged minimal enhancement. The extent of surrounding T2 FLAIR hyperintensity has minimally increased posterior superiorly (see series 11, image 41 on both current and prior studies). Ventricles are stable in size. Unchanged extra-axial collection underlying the craniotomy. Chronic infarct of the left basal ganglia and adjacent white matter. Small chronic cerebellar infarcts. Stable additional patchy foci of T2 hyperintensity in the supratentorial white matter probably reflecting chronic microvascular ischemic changes. Unchanged hemosiderin staining along the brainstem. Vascular: Major vessel flow voids at the skull base are preserved. Skull and upper cervical spine: Normal marrow signal is preserved. Sinuses/Orbits: Paranasal sinuses are aerated. Orbits are unremarkable. Other: Sella is unremarkable.  Mastoid air cells are clear. IMPRESSION:  Minimal increase in abnormal T2 FLAIR hyperintensity posterosuperiorly from the resection cavity. However, no new abnormal enhancement. Electronically Signed   By: PMacy MisM.D.   On: 09/27/2021 10:03     Assessment/Plan Frontal glioblastoma multiforme ([facility]) [C71.1]  DGardenia Phlegmis clinically stable today, with mild subjective cognitive complaints only.  MRI demonstrates modest leukomalacia which we suspect is secondary to radiation treatment effect rather than progressive neoplasm.   Because of very good local control on MRI, lack of symptoms, we recommended continuing serial imaging monitoring.  Keppra should maintain at 10088mBID for now.  Renal cell carcinoma will con't to be monitored by Dr. EnMarin Olpplan is for observation only.  We ask that DaGardenia Phlegmeturn to clinic in  3 month with MRI brain for evaluation.  Will defer further chemo if scan is stable.  All questions were answered. The patient knows to call the clinic with any problems, questions or concerns. No barriers to learning were detected.  I have spent a total of 30 minutes of face-to-face and non-face-to-face time, excluding clinical staff time, preparing to see patient, ordering tests and/or medications, counseling the patient, and independently interpreting results and communicating results to the patient/family/caregiver    Ventura Sellers, MD Medical Director of Neuro-Oncology [facility] 10/03/21 12:13 PM

## 2021-11-28 ENCOUNTER — Other Ambulatory Visit: Payer: Self-pay | Admitting: Radiation Therapy

## 2021-12-20 ENCOUNTER — Ambulatory Visit (HOSPITAL_COMMUNITY)
Admission: RE | Admit: 2021-12-20 | Discharge: 2021-12-20 | Disposition: A | Discharge status: Home / Self Care | Payer: Medicare Other | Source: Ambulatory Visit | Attending: Internal Medicine | Admitting: Internal Medicine

## 2021-12-20 ENCOUNTER — Other Ambulatory Visit: Payer: Self-pay

## 2021-12-20 DIAGNOSIS — C719 Malignant neoplasm of brain, unspecified: Secondary | ICD-10-CM | POA: Diagnosis not present

## 2021-12-20 DIAGNOSIS — I639 Cerebral infarction, unspecified: Secondary | ICD-10-CM | POA: Diagnosis not present

## 2021-12-20 DIAGNOSIS — C711 Malignant neoplasm of frontal lobe: Secondary | ICD-10-CM | POA: Insufficient documentation

## 2021-12-20 MED ORDER — GADOBUTROL 1 MMOL/ML IV SOLN
7.0000 mL | Freq: Once | INTRAVENOUS | Status: AC | PRN
  Administered 2021-12-20: 7 mL via INTRAVENOUS

## 2021-12-23 ENCOUNTER — Inpatient Hospital Stay: Payer: Medicare Other | Attending: Internal Medicine

## 2021-12-23 DIAGNOSIS — C711 Malignant neoplasm of frontal lobe: Secondary | ICD-10-CM | POA: Insufficient documentation

## 2021-12-23 DIAGNOSIS — C641 Malignant neoplasm of right kidney, except renal pelvis: Secondary | ICD-10-CM | POA: Insufficient documentation

## 2021-12-31 ENCOUNTER — Inpatient Hospital Stay (HOSPITAL_BASED_OUTPATIENT_CLINIC_OR_DEPARTMENT_OTHER): Payer: Medicare Other | Admitting: Internal Medicine

## 2021-12-31 ENCOUNTER — Other Ambulatory Visit (HOSPITAL_COMMUNITY): Payer: Self-pay

## 2021-12-31 ENCOUNTER — Other Ambulatory Visit: Payer: Self-pay | Admitting: Internal Medicine

## 2021-12-31 ENCOUNTER — Other Ambulatory Visit: Payer: Self-pay

## 2021-12-31 ENCOUNTER — Telehealth: Payer: Self-pay | Admitting: Pharmacist

## 2021-12-31 VITALS — BP 138/77 | HR 75 | Temp 97.8°F | Resp 17 | Wt 166.9 lb

## 2021-12-31 DIAGNOSIS — C711 Malignant neoplasm of frontal lobe: Secondary | ICD-10-CM

## 2021-12-31 DIAGNOSIS — C641 Malignant neoplasm of right kidney, except renal pelvis: Secondary | ICD-10-CM | POA: Diagnosis not present

## 2021-12-31 MED ORDER — TEMOZOLOMIDE 180 MG PO CAPS
200.0000 mg/m2/d | ORAL_CAPSULE | Freq: Every day | ORAL | 0 refills | Status: DC
  Filled 2021-12-31: qty 10, 5d supply, fill #0
  Filled 2021-12-31: qty 10, 28d supply, fill #0

## 2021-12-31 MED ORDER — ONDANSETRON HCL 8 MG PO TABS
8.0000 mg | ORAL_TABLET | Freq: Two times a day (BID) | ORAL | 1 refills | Status: DC | PRN
  Filled 2021-12-31: qty 30, 15d supply, fill #0

## 2021-12-31 NOTE — Telephone Encounter (Signed)
Oral Chemotherapy Pharmacist Encounter  I spoke with patient for overview of: Temodar (temozolomide) for re-initiation for the treatment of glioblastoma, planned duration until disease progression or unacceptable drug toxicity.  Reviewed administration, dosing, side effects, monitoring, drug-food interactions, safe handling, storage, and disposal.  Patient will take Temodar 180mg  capsules, 2 capsules, 360mg  total daily dose, by mouth once daily, may take at bedtime and on an empty stomach to decrease nausea and vomiting.   Patient will take Temodar daily for 5 days on, 23 days off. Repeated every 28 days.  Temodar start date: 01/02/22   Patient will take Zofran 8mg  tablet, 1 tablet by mouth 30-60 min prior to Temodar dose to help decrease N/V.    Adverse effects include but are not limited to: nausea, vomiting, anorexia, GI upset, rash, drug fever, and fatigue. Rare but serious adverse effects of pneumocystis pneumonia and secondary malignancy.  Reviewed importance of keeping a medication schedule and plan for any missed doses. No barriers to medication adherence identified.  Medication reconciliation performed and medication/allergy list updated.  Insurance authorization for Temodar has been obtained. This will ship from the [facility] on 12/31/21 to deliver to patient's home on 01/02/22.  Patient informed the pharmacy will reach out 5-7 days prior to needing next fill of Temodar to coordinate continued medication acquisition to prevent break in therapy.  All questions answered.  Mathew Leach voiced understanding and appreciation.   Medication education handout placed in mail for patient. Patient knows to call the office with questions or concerns. Oral Chemotherapy Clinic phone number provided to patient.   Leron Croak, PharmD, BCPS Hematology/Oncology Clinical Pharmacist [facility] [phone] 12/31/2021 2:08 PM

## 2021-12-31 NOTE — Telephone Encounter (Signed)
Oral Oncology Pharmacist Encounter  Received prescription for Temodar (temozolomide) for re-initiation for the treatment of glioblastoma, planned duration until disease progression or unacceptable drug toxicity.  CBC w/ Diff and CMP from 09/19/22 assessed, no baseline dose adjustments required. MD aware that patient has had weight change from what is recorded in treatment plan (BSA in treatment plan 1.38m2, BSA on 12/31/21 1.80m2). Per MD plan to continue patient with prescription as written with total daily dose being 360 mg.  Current medication list in Epic reviewed, no relevant/significant DDIs with Temodar identified.  Evaluated chart and no patient barriers to medication adherence noted.   Patient agreement for treatment documented in MD note on 12/31/21.  Prescription has been e-scribed to the [facility] for benefits analysis and approval.  Patient's copay is $14  Oral Oncology Clinic will continue to follow for initial counseling and start date.  Leron Croak, PharmD, BCPS Hematology/Oncology Clinical Pharmacist [facility] [place] [phone] 12/31/2021 1:43 PM

## 2021-12-31 NOTE — Progress Notes (Signed)
[facility] [place],  [id number] [phone]   Interval Evaluation  Date of Service: 12/31/21 Patient Name: Mathew Leach Patient MRN: [id number] Patient DOB: Nov 08, 1951 Provider: Ventura Sellers, MD  Identifying Statement:  Mathew Leach is a 71 y.o. 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 Kathyrn Sheriff; path demonstrates Glioblastaoma IDH-wt   10/17/2020 - 11/06/2020 Radiation Therapy   Completes abbreviated 15 fraction IMRT w/ concurrent Temodar      Chemotherapy   Patient is on Treatment Plan :  BRAIN GLIOBLASTOMA Post XRT Temozolomide Days 1-5 q28 Days      12/31/2021 Progression   Progression of disease.  Plan to resume 5-day Temodar.     Biomarkers:  MGMT Unknown.  IDH 1/2 Wild type.  EGFR Unknown  TERT Unknown   Interval History: Mathew Leach presents to clinic today following recent MRI brain.  No new or progressive neurologic deficits today. Otherwise remains independent with gait, basic ADLs, although memory is "a little slower".  They are thinking about moving to [place] to be closer to  family.  Denies further seizures, no headaches.  H+P (10/04/20)  Mathew Leach presented to medical attention three weeks ago with new onset generalized seizure.  This occurred at work, unwitnessed, and then second event was witnessed by EMS described as generalized shaking.  CNS imaging demonstrated left frontal mass lesion, which was resected by Dr. Kathyrn Sheriff on 09/18/20.  Following surgery, he had no clinical complaints.  Workup did also demonstrate mass within kidney possibly c/w renal cell carcinoma.  He presents today for path review; he is fully independent for age, lives with his wife.   Medications: Current Outpatient  Medications on File Prior to Visit  Medication Sig Dispense Refill   amLODipine (NORVASC) 10 MG tablet Take 1 tablet by mouth daily.     levETIRAcetam (KEPPRA) 1000 MG tablet Take 1,000 mg by mouth 2 (two) times daily.     loperamide (IMODIUM) 2 MG capsule Take 2 mg by mouth daily as needed for diarrhea or loose stools.     simvastatin (ZOCOR) 20 MG tablet Take 1 tablet (20 mg total) by mouth daily. (Patient taking differently: Take 20 mg by mouth daily with lunch.) 90 tablet 3   No current facility-administered medications on file prior to visit.    Allergies:  Allergies  Allergen Reactions   Lipitor [Atorvastatin] Other (See Comments)    Caused DOUBLE VISION    Past Medical History:  Past Medical History:  Diagnosis Date   Actinic keratosis    scalp   Arthritis    ASD (atrial septal defect) 06/10/2015   Atrial flutter, paroxysmal ([place]) 08/01/2015   Cancer of right kidney ([place]) 06/05/2021   Chronic anticoagulation 08/29/2015   Chronic pain of right knee 12/07/2014   Dysplastic nevus 01/11/2015   upper back spinal   Dysplastic nevus 01/29/2017   right superior calf inferior to popliteal   Dysrhythmia    AFIB   Goals of care, counseling/discussion 09/17/2020   Hypertension    Kidney stones    Obstructive sleep apnea syndrome 06/26/2015   PAF (paroxysmal atrial fibrillation) ([place]) 07/30/2015   Pericarditis    1999   PFO (patent foramen ovale) 06/16/2018   Preoperative cardiovascular examination 02/03/2016   S/P TKR (total knee replacement) using cement,  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   Shortness of breath dyspnea    W/ EXERTION    Sleep apnea    CPAP   Stroke ([place])    04-2015   Past Surgical History:  Past Surgical History:  Procedure Laterality Date   APPLICATION OF CRANIAL NAVIGATION Left 09/18/2020   Procedure: APPLICATION OF CRANIAL NAVIGATION;  Surgeon: Consuella Lose, MD;  Location: [facility];  Service: Neurosurgery;  Laterality: Left;   COLONOSCOPY  04/10/2011   Moderate  predominantly sigmoid diverticulosis. Small internal hemorrhoids.    COLONOSCOPY  01/30/2020   CRANIOTOMY Left 09/18/2020   Procedure: LEFT FRONTAL CRANIOTOMY FOR  TUMOR;  Surgeon: Consuella Lose, MD;  Location: [facility];  Service: Neurosurgery;  Laterality: Left;  left   CYST REMOVAL NECK     AGE 58   ESOPHAGOGASTRODUODENOSCOPY  04/10/2011   Erosive esophagitis with esophageal stricture (asymptomatic strictures since the patient not having any dysphagia). LA grade D esophagitis.   INGUINAL HERNIA REPAIR     LEFT AS CHILD   ROBOTIC ASSITED PARTIAL NEPHRECTOMY Right 08/28/2021   Procedure: XI ROBOTIC ASSISTED PARTIAL NEPHRECTOMY AND RENAL CYST DECORTICATION WITH INTRAOPERTATIVE ULTRASOUND;  Surgeon: Alexis Frock, MD;  Location: [facility];  Service: Urology;  Laterality: Right;  3 HRS   TOTAL KNEE ARTHROPLASTY Right 03/17/2016   Procedure: TOTAL KNEE ARTHROPLASTY;  Surgeon: [name], MD;  Location: [facility];  Service: Orthopedics;  Laterality: Right;   watchman procedure  07/2019   no longer needs Eliquis   Social History:  Social History   Socioeconomic History   Marital status: Married    Spouse name: Not on file   Number of children: Not on file   Years of education: Not on file   Highest education level: Not on file  Occupational History   Not on file  Tobacco Use   Smoking status: Never   Smokeless tobacco: Never  Vaping Use   Vaping Use: Never used  Substance and Sexual Activity   Alcohol use: No   Drug use: No   Sexual activity: Not on file  Other Topics Concern   Not on file  Social History Narrative   Not on file   Social Determinants of Health   Financial Resource Strain: Not on file  Food Insecurity: Not on file  Transportation Needs: Not on file  Physical Activity: Not on file  Stress: Not on file  Social Connections: Not on file  Intimate Partner Violence: Not on file   Family History:  Family History  Problem Relation Age of Onset   Lung cancer  Father    Colon cancer Neg Hx    Esophageal cancer Neg Hx    Colon polyps Neg Hx    Rectal cancer Neg Hx    Stomach cancer Neg Hx     Review of Systems: Constitutional: Doesn't report fevers, chills or abnormal weight loss Eyes: Doesn't report blurriness of vision Ears, nose, mouth, throat, and face: Doesn't report sore throat Respiratory: Doesn't report cough, dyspnea or wheezes Cardiovascular: Doesn't report palpitation, chest discomfort  Gastrointestinal:  Doesn't report nausea, constipation, diarrhea GU: Doesn't report incontinence Skin: Doesn't report skin rashes Neurological: Per HPI Musculoskeletal: Doesn't report joint pain Behavioral/Psych: Doesn't report anxiety  Physical Exam: Vitals:   12/31/21 1139  BP: 138/77  Pulse: 75  Resp: 17  Temp: 97.8 F (36.6 C)  SpO2: 99%   KPS: 80. General: Alert, cooperative, pleasant, in no acute distress Head: Normal EENT: No conjunctival injection or scleral icterus.  Lungs: Resp  effort normal Cardiac: Regular rate Abdomen: Non-distended abdomen Skin: No rashes cyanosis or petechiae. Extremities: No clubbing or edema  Neurologic Exam: Mental Status: Awake, alert, attentive to examiner. Oriented to self and environment. Language is fluent with intact comprehension.  Cranial Nerves: Visual acuity is grossly normal. Visual fields are full. Extra-ocular movements intact. No ptosis. Face is symmetric Motor: Tone and bulk are normal. Power is full in both arms and legs. Reflexes are symmetric, no pathologic reflexes present.  Sensory: Intact to light touch Gait: Normal.   Labs: I have reviewed the data as listed    Component Value Date/Time   NA 141 09/19/2021 1422   NA 143 02/13/2020 1037   K 4.1 09/19/2021 1422   CL 104 09/19/2021 1422   CO2 29 09/19/2021 1422   GLUCOSE 203 (H) 09/19/2021 1422   BUN 13 09/19/2021 1422   BUN 13 02/13/2020 1037   CREATININE 1.20 09/19/2021 1422   CALCIUM 9.5 09/19/2021 1422   PROT  6.5 09/19/2021 1422   PROT 6.7 02/13/2020 1037   ALBUMIN 4.1 09/19/2021 1422   ALBUMIN 4.3 02/13/2020 1037   AST 16 09/19/2021 1422   ALT 15 09/19/2021 1422   ALKPHOS 96 09/19/2021 1422   BILITOT 0.5 09/19/2021 1422   GFRNONAA >60 09/19/2021 1610   GFRAA DUPLICATE 96/02/5408 8119   Lab Results  Component Value Date   WBC 6.9 09/19/2021   NEUTROABS 4.6 09/19/2021   HGB 13.7 09/19/2021   HCT 42.1 09/19/2021   MCV 92.7 09/19/2021   PLT 302 09/19/2021   Imaging:  [facility] Clinician Interpretation: I have personally reviewed the CNS images as listed.  My interpretation, in the context of the patient's clinical presentation, is progressive disease  MR BRAIN W WO CONTRAST  Result Date: 12/20/2021 CLINICAL DATA:  Brain/CNS neoplasm, assess treatment response; glioblastoma EXAM: MRI HEAD WITHOUT AND WITH CONTRAST TECHNIQUE: Multiplanar, multiecho pulse sequences of the brain and surrounding structures were obtained without and with intravenous contrast. CONTRAST:  45m GADAVIST GADOBUTROL 1 MMOL/ML IV SOLN COMPARISON:  09/26/2021 FINDINGS: Brain: Left frontal CSF intensity resection cavity is again identified with chronic blood products at the margins. New thick enhancement at the posterosuperior margin extending into the adjacent middle frontal gyrus parenchyma. Increased T2 FLAIR hyperintensity in this region. Extent of T2 FLAIR hyperintensity is otherwise unchanged. No acute infarction. Ventricles are stable in size. There is a stable extra-axial collection underlying the craniotomy. Chronic infarct of the left basal ganglia and adjacent white matter. Small chronic cerebellar infarcts. There are stable additional patchy foci of T2 hyperintensity in the supratentorial white matter probably reflecting chronic microvascular ischemic changes. There is air in deposition is again identified along the brainstem. Vascular: Major vessel flow voids at the skull base are preserved. Skull and upper cervical spine:  Normal marrow signal is preserved. Sinuses/Orbits: Paranasal sinuses are aerated. Orbits are unremarkable. Other: Sella is unremarkable.  Mastoid air cells are clear. IMPRESSION: New thick enhancement at the posterosuperior margin of the left frontal resection cavity. May reflect recurrent tumor or late radiation treatment effect. Extent of T2 FLAIR hyperintensity is stable apart from increased in the area of enhancement. Electronically Signed   By: PMacy MisM.D.   On: 12/20/2021 13:07     Assessment/Plan Frontal glioblastoma multiforme ([place] [C71.1]  Mathew Phlegmis clinically stable today.  MRI demonstrates new focus of nodular enhancement adjacent to the left frontal resection cavity; this likely represents recurrence of tumor.   We discussed options moving forward,  including re-resection, resumption of 5-day Temodar, metronomic Temodar, and continued surveillance.  He would like to re-initiate 5-day Temodar, would prefer not to go through surgery again.    We therefore recommended initiating treatment with cycle #7 Temozolomide 200 mg/m2, on for five days and off for twenty three days in twenty eight day cycles. The patient will have a complete blood count performed on days 21 and 28 of each cycle, and a comprehensive metabolic panel performed on day 28 of each cycle. Labs may need to be performed more often. Zofran will prescribed for home use for nausea/vomiting.   Chemotherapy should be held for the following:  ANC less than 1,000  Platelets less than 100,000  LFT or creatinine greater than 2x ULN  If clinical concerns/contraindications develop  Keppra should maintain at 1031m BID for now.  Renal cell carcinoma will con't to be monitored by Dr. EMarin Olp plan is for observation only.  We ask that Mathew Leach to clinic in 1 month with labs for evaluation prior to cycle #8.  Next MRI in 2 months.  All questions were answered. The patient knows to call  the clinic with any problems, questions or concerns. No barriers to learning were detected.  I have spent a total of 40 minutes of face-to-face and non-face-to-face time, excluding clinical staff time, preparing to see patient, ordering tests and/or medications, counseling the patient, and independently interpreting results and communicating results to the patient/family/caregiver    ZVentura Sellers MD Medical Director of Neuro-Oncology [facility]02/14/23 12:26 PM

## 2022-01-01 ENCOUNTER — Encounter (HOSPITAL_COMMUNITY): Payer: Self-pay

## 2022-01-01 ENCOUNTER — Other Ambulatory Visit (HOSPITAL_COMMUNITY): Payer: Self-pay

## 2022-01-01 ENCOUNTER — Emergency Department (HOSPITAL_COMMUNITY)
Admission: EM | Admit: 2022-01-01 | Discharge: 2022-01-01 | Disposition: A | Discharge status: Home / Self Care | Payer: Medicare Other | Attending: Emergency Medicine | Admitting: Emergency Medicine

## 2022-01-01 ENCOUNTER — Other Ambulatory Visit: Payer: Self-pay

## 2022-01-01 DIAGNOSIS — L519 Erythema multiforme, unspecified: Secondary | ICD-10-CM | POA: Diagnosis not present

## 2022-01-01 DIAGNOSIS — Z79899 Other long term (current) drug therapy: Secondary | ICD-10-CM | POA: Insufficient documentation

## 2022-01-01 DIAGNOSIS — R569 Unspecified convulsions: Secondary | ICD-10-CM

## 2022-01-01 DIAGNOSIS — R001 Bradycardia, unspecified: Secondary | ICD-10-CM | POA: Diagnosis not present

## 2022-01-01 DIAGNOSIS — C719 Malignant neoplasm of brain, unspecified: Secondary | ICD-10-CM

## 2022-01-01 DIAGNOSIS — I491 Atrial premature depolarization: Secondary | ICD-10-CM | POA: Diagnosis not present

## 2022-01-01 LAB — COMPREHENSIVE METABOLIC PANEL
ALT: 20 U/L (ref 0–44)
AST: 23 U/L (ref 15–41)
Albumin: 3.6 g/dL (ref 3.5–5.0)
Alkaline Phosphatase: 79 U/L (ref 38–126)
Anion gap: 8 (ref 5–15)
BUN: 11 mg/dL (ref 8–23)
CO2: 26 mmol/L (ref 22–32)
Calcium: 8.9 mg/dL (ref 8.9–10.3)
Chloride: 107 mmol/L (ref 98–111)
Creatinine, Ser: 1.28 mg/dL — ABNORMAL HIGH (ref 0.61–1.24)
GFR, Estimated: 59 mL/min — ABNORMAL LOW (ref >60)
Glucose, Bld: 116 mg/dL — ABNORMAL HIGH (ref 70–99)
Potassium: 3.9 mmol/L (ref 3.5–5.1)
Sodium: 141 mmol/L (ref 135–145)
Total Bilirubin: 0.4 mg/dL (ref 0.3–1.2)
Total Protein: 6.2 g/dL — ABNORMAL LOW (ref 6.5–8.1)

## 2022-01-01 LAB — CBC
HCT: 43 % (ref 39.0–52.0)
Hemoglobin: 14.1 g/dL (ref 13.0–17.0)
MCH: 29.2 pg (ref 26.0–34.0)
MCHC: 32.8 g/dL (ref 30.0–36.0)
MCV: 89 fL (ref 80.0–100.0)
Platelets: 244 10*3/uL (ref 150–400)
RBC: 4.83 MIL/uL (ref 4.22–5.81)
RDW: 12.5 % (ref 11.5–15.5)
WBC: 5.3 10*3/uL (ref 4.0–10.5)
nRBC: 0 % (ref 0.0–0.2)

## 2022-01-01 MED ORDER — LEVETIRACETAM IN NACL 1000 MG/100ML IV SOLN
1000.0000 mg | Freq: Once | INTRAVENOUS | Status: AC
  Administered 2022-01-01: 1000 mg via INTRAVENOUS
  Filled 2022-01-01: qty 100

## 2022-01-01 MED ORDER — LEVETIRACETAM 750 MG PO TABS: 1500.0000 mg | ORAL_TABLET | Freq: Two times a day (BID) | ORAL | 0 refills | Status: AC

## 2022-01-01 NOTE — ED Triage Notes (Signed)
Per EMS, Pt, from home, had a witnessed seizure earlier for 2-3 mins.  Pt has a history of brain CA and was seen yesterday for recurrence of mets.  Pt takes keppra and compliant w/ medications.

## 2022-01-01 NOTE — Discharge Instructions (Signed)
Increase the Keppra to 1500 mg twice a day.  Take a pill and a half twice a day.  Follow-up with Dr. Mickeal Skinner.

## 2022-01-01 NOTE — ED Provider Notes (Addendum)
[facility] EMERGENCY DEPARTMENT Provider Note   CSN: [id number] Arrival date & time: 01/01/22  1828     History  Chief Complaint  Patient presents with   Seizures    Mathew Leach is a 71 y.o. male.   Seizures Patient with history of glioblastoma.  Recent recurrent disease.  On Keppra 1000 g twice a day.  Has not had a recent seizure but today had 2 minute witnessed seizure.  Has been compliant with medications.  Initially confused but mental status has been improving.  Sees Dr. Mickeal Skinner.    Home Medications Prior to Admission medications   Medication Sig Start Date End Date Taking? Authorizing Provider  amLODipine (NORVASC) 10 MG tablet Take 1 tablet by mouth daily. 07/02/21   [provider]  levETIRAcetam (KEPPRA) 750 MG tablet Take 2 tablets (1,500 mg total) by mouth 2 (two) times daily. 01/01/22   Davonna Belling, MD  loperamide (IMODIUM) 2 MG capsule Take 2 mg by mouth daily as needed for diarrhea or loose stools.    [provider]  ondansetron (ZOFRAN) 8 MG tablet Take 1 tablet (8 mg total) by mouth 2 (two) times daily as needed (nausea and vomiting). May take 30-60 minutes prior to Temodar administration if nausea/vomiting occurs. 12/31/21   Ventura Sellers, MD  simvastatin (ZOCOR) 20 MG tablet Take 1 tablet (20 mg total) by mouth daily. Patient taking differently: Take 20 mg by mouth daily with lunch. 07/16/20   [name], MD  temozolomide (TEMODAR) 180 MG capsule Take 2 capsules (360 mg total) by mouth daily for 5 days. May take on an empty stomach to decrease nausea & vomiting. 12/31/21 01/28/22  Ventura Sellers, MD      Allergies    Lipitor [atorvastatin]    Review of Systems   Review of Systems  Constitutional:  Negative for appetite change.  Respiratory:  Negative for shortness of breath.   Neurological:  Positive for seizures.  Psychiatric/Behavioral:  Positive for confusion.    Physical Exam Updated Vital  Signs BP 127/87    Pulse 72    Temp 97.7 F (36.5 C) (Oral)    Resp 15    Ht 5\' 8"  (1.727 m)    Wt 75.3 kg    SpO2 96%    BMI 25.24 kg/m  Physical Exam Vitals and nursing note reviewed.  HENT:     Head: Atraumatic.  Cardiovascular:     Rate and Rhythm: Normal rate.  Pulmonary:     Breath sounds: No wheezing or rhonchi.  Skin:    Capillary Refill: Capillary refill takes less than 2 seconds.  Neurological:     Mental Status: He is alert.     Comments: Awake and pleasant.  Somewhat slow to answer but able to answer questions appropriately.    ED Results / Procedures / Treatments   Labs (all labs ordered are listed, but only abnormal results are displayed) Labs Reviewed  COMPREHENSIVE METABOLIC PANEL - Abnormal; Notable for the following components:      Result Value   Glucose, Bld 116 (*)    Creatinine, Ser 1.28 (*)    Total Protein 6.2 (*)    GFR, Estimated 59 (*)    All other components within normal limits  CBC    EKG None  Radiology No results found.  Procedures Procedures    Medications Ordered in ED Medications  levETIRAcetam (KEPPRA) IVPB 1000 mg/100 mL premix (0 mg Intravenous Stopped 01/01/22 2117)  ED Course/ Medical Decision Making/ A&P                           Medical Decision Making Problems Addressed: Glioblastoma multiforme [facility]): chronic illness or injury Seizure [facility]): acute illness or injury  Amount and/or Complexity of Data Reviewed Labs: ordered.  Risk Prescription drug management.   Patient presents with seizure.  History of same.  Initial differential diagnosis include recurrent seizure, hemorrhagic conversion, other cause.  Patient with known recurrent glioblastoma.  On Keppra for previous seizures.  Had witnessed seizure today.  Lasted 2 to 3 minutes.  Was postictal but improving upon arrival here.  No injury.  Discussed with Dr. Mickeal Skinner, his neuro oncologist.  Had recent MRI and if returns to baseline we do not feels we need CT.   Doubt hemorrhagic conversion at this time.  Have loaded with IV Keppra and will increase from 1000 mg twice a day to 1500 twice a day.  CBC and CMP overall reassuring.  Creatinine just mildly elevated on the CMP.  On reevaluation patient's mental state improved.  Back at baseline.  Do not feel as if he needs admission to the hospital.  Discharge home and follow-up with neuro-oncology        Final Clinical Impression(s) / ED Diagnoses Final diagnoses:  Seizure ([place])  Glioblastoma multiforme ([place])    Rx / DC Orders ED Discharge Orders          Ordered    levETIRAcetam (KEPPRA) 750 MG tablet  2 times daily        01/01/22 2111              Davonna Belling, MD 01/01/22 2123    Davonna Belling, MD 01/01/22 2124

## 2022-01-03 ENCOUNTER — Telehealth: Payer: Self-pay | Admitting: Internal Medicine

## 2022-01-03 NOTE — Telephone Encounter (Signed)
Sch per 2/16 inbasket, pt wife aware. °

## 2022-01-10 DIAGNOSIS — R7303 Prediabetes: Secondary | ICD-10-CM | POA: Diagnosis not present

## 2022-01-10 DIAGNOSIS — E782 Mixed hyperlipidemia: Secondary | ICD-10-CM | POA: Diagnosis not present

## 2022-01-14 ENCOUNTER — Other Ambulatory Visit (HOSPITAL_COMMUNITY): Payer: Self-pay

## 2022-01-20 DIAGNOSIS — Z1339 Encounter for screening examination for other mental health and behavioral disorders: Secondary | ICD-10-CM | POA: Diagnosis not present

## 2022-01-20 DIAGNOSIS — E782 Mixed hyperlipidemia: Secondary | ICD-10-CM | POA: Diagnosis not present

## 2022-01-20 DIAGNOSIS — N182 Chronic kidney disease, stage 2 (mild): Secondary | ICD-10-CM | POA: Diagnosis not present

## 2022-01-20 DIAGNOSIS — R7303 Prediabetes: Secondary | ICD-10-CM | POA: Diagnosis not present

## 2022-01-20 DIAGNOSIS — I129 Hypertensive chronic kidney disease with stage 1 through stage 4 chronic kidney disease, or unspecified chronic kidney disease: Secondary | ICD-10-CM | POA: Diagnosis not present

## 2022-01-20 DIAGNOSIS — Z0000 Encounter for general adult medical examination without abnormal findings: Secondary | ICD-10-CM | POA: Diagnosis not present

## 2022-01-20 DIAGNOSIS — Z139 Encounter for screening, unspecified: Secondary | ICD-10-CM | POA: Diagnosis not present

## 2022-01-20 DIAGNOSIS — Z1331 Encounter for screening for depression: Secondary | ICD-10-CM | POA: Diagnosis not present

## 2022-01-21 ENCOUNTER — Other Ambulatory Visit (HOSPITAL_COMMUNITY): Payer: Self-pay

## 2022-01-23 DIAGNOSIS — Z20822 Contact with and (suspected) exposure to covid-19: Secondary | ICD-10-CM | POA: Diagnosis not present

## 2022-01-27 ENCOUNTER — Other Ambulatory Visit (HOSPITAL_COMMUNITY): Payer: Self-pay

## 2022-01-27 ENCOUNTER — Other Ambulatory Visit: Payer: Self-pay | Admitting: *Deleted

## 2022-01-27 DIAGNOSIS — C711 Malignant neoplasm of frontal lobe: Secondary | ICD-10-CM

## 2022-01-28 ENCOUNTER — Inpatient Hospital Stay: Payer: Medicare Other | Attending: Internal Medicine

## 2022-01-28 ENCOUNTER — Inpatient Hospital Stay (HOSPITAL_BASED_OUTPATIENT_CLINIC_OR_DEPARTMENT_OTHER): Payer: Medicare Other | Admitting: Internal Medicine

## 2022-01-28 ENCOUNTER — Other Ambulatory Visit (HOSPITAL_COMMUNITY): Payer: Self-pay

## 2022-01-28 ENCOUNTER — Other Ambulatory Visit: Payer: Self-pay

## 2022-01-28 VITALS — BP 129/75 | HR 68 | Temp 96.9°F | Resp 17 | Wt 167.9 lb

## 2022-01-28 DIAGNOSIS — C641 Malignant neoplasm of right kidney, except renal pelvis: Secondary | ICD-10-CM | POA: Insufficient documentation

## 2022-01-28 DIAGNOSIS — R569 Unspecified convulsions: Secondary | ICD-10-CM

## 2022-01-28 DIAGNOSIS — C711 Malignant neoplasm of frontal lobe: Secondary | ICD-10-CM

## 2022-01-28 LAB — CBC WITH DIFFERENTIAL (CANCER CENTER ONLY)
Abs Immature Granulocytes: 0.02 10*3/uL (ref 0.00–0.07)
Basophils Absolute: 0.1 10*3/uL (ref 0.0–0.1)
Basophils Relative: 2 %
Eosinophils Absolute: 0.1 10*3/uL (ref 0.0–0.5)
Eosinophils Relative: 3 %
HCT: 44.7 % (ref 39.0–52.0)
Hemoglobin: 15.1 g/dL (ref 13.0–17.0)
Immature Granulocytes: 0 %
Lymphocytes Relative: 26 %
Lymphs Abs: 1.2 10*3/uL (ref 0.7–4.0)
MCH: 30 pg (ref 26.0–34.0)
MCHC: 33.8 g/dL (ref 30.0–36.0)
MCV: 88.9 fL (ref 80.0–100.0)
Monocytes Absolute: 0.5 10*3/uL (ref 0.1–1.0)
Monocytes Relative: 11 %
Neutro Abs: 2.7 10*3/uL (ref 1.7–7.7)
Neutrophils Relative %: 58 %
Platelet Count: 255 10*3/uL (ref 150–400)
RBC: 5.03 MIL/uL (ref 4.22–5.81)
RDW: 12.8 % (ref 11.5–15.5)
WBC Count: 4.6 10*3/uL (ref 4.0–10.5)
nRBC: 0 % (ref 0.0–0.2)

## 2022-01-28 LAB — CMP (CANCER CENTER ONLY)
ALT: 17 U/L (ref 0–44)
AST: 19 U/L (ref 15–41)
Albumin: 4.3 g/dL (ref 3.5–5.0)
Alkaline Phosphatase: 82 U/L (ref 38–126)
Anion gap: 7 (ref 5–15)
BUN: 15 mg/dL (ref 8–23)
CO2: 28 mmol/L (ref 22–32)
Calcium: 9.5 mg/dL (ref 8.9–10.3)
Chloride: 106 mmol/L (ref 98–111)
Creatinine: 1.23 mg/dL (ref 0.61–1.24)
GFR, Estimated: >60 mL/min (ref >60)
Glucose, Bld: 106 mg/dL — ABNORMAL HIGH (ref 70–99)
Potassium: 3.9 mmol/L (ref 3.5–5.1)
Sodium: 141 mmol/L (ref 135–145)
Total Bilirubin: 0.7 mg/dL (ref 0.3–1.2)
Total Protein: 6.8 g/dL (ref 6.5–8.1)

## 2022-01-28 MED ORDER — TEMOZOLOMIDE 180 MG PO CAPS
200.0000 mg/m2/d | ORAL_CAPSULE | Freq: Every day | ORAL | 0 refills | Status: AC
  Filled 2022-01-28: qty 10, 5d supply, fill #0
  Filled 2022-01-28: qty 10, 28d supply, fill #0
  Filled 2022-01-28: qty 10, 5d supply, fill #0

## 2022-01-28 MED ORDER — LORAZEPAM 1 MG PO TABS
1.0000 mg | ORAL_TABLET | Freq: Three times a day (TID) | ORAL | 0 refills | Status: DC | PRN
Start: 2022-01-28 — End: 2022-03-18

## 2022-01-28 NOTE — Progress Notes (Signed)
? ?[place] [facility] at [address]  ?[place], [place] [address] ?[phone] ? ? ?Interval Evaluation ? ?Date of Service: 01/28/22 ?Patient Name: Mathew Leach ?Patient MRN: [id number] ?Patient DOB: [birth year] ?Provider: Ventura Sellers, MD ? ?Identifying Statement:  ?Mathew Leach is a 71 y.o. male with left frontal glioblastoma  ? ?Referring Provider: ? ?Oncologic History: ?Oncology History  ?Frontal glioblastoma multiforme (HCC)  ?09/18/2020 Surgery  ? Left frontal craniotomy, resection by Dr. Kathyrn Sheriff; path demonstrates Glioblastaoma IDH-wt ?  ?10/17/2020 - 11/06/2020 Radiation Therapy  ? Completes abbreviated 15 fraction IMRT w/ concurrent Temodar   ?  ? Chemotherapy  ? Patient is on Treatment Plan :  ?BRAIN GLIOBLASTOMA Post XRT Temozolomide Days 1-5 q28 Days    ?  ?12/31/2021 Progression  ? Progression of disease.  Plan to resume 5-day Temodar. ?  ? ? ?Biomarkers: ? ?MGMT Unknown.  ?IDH 1/2 Wild type.  ?EGFR Unknown  ?TERT Unknown  ? ?Interval History: Mathew Leach presents to clinic today following cycle #7 of (resumed) 5-day Temodar.  Tolerated therapy well this month.  He did have one seizure, just prior to dosing, Keppra was increased to 1592mtwice per day. No new or progressive neurologic deficits today. Otherwise remains independent with gait, basic ADLs, although memory is "a little slower".  [place].  ? ?H+P (10/04/20)  Mathew Leach to medical attention three weeks ago with new onset generalized seizure.  This occurred at work, unwitnessed, and then second event was witnessed by EMS described as generalized shaking.  CNS imaging demonstrated left frontal mass lesion, which was resected by Dr. NKathyrn Sheriffon 09/18/20.  Following surgery, he had no clinical complaints.  Workup did also demonstrate mass within kidney possibly c/w renal cell carcinoma.  He presents today  for path review; he is fully independent for age, lives with his wife.  ? ?Medications: ?Current Outpatient Medications on File Prior to Visit  ?Medication Sig Dispense Refill  ? amLODipine (NORVASC) 10 MG tablet Take 1 tablet by mouth daily.    ? levETIRAcetam (KEPPRA) 750 MG tablet Take 2 tablets (1,500 mg total) by mouth 2 (two) times daily. 90 tablet 0  ? loperamide (IMODIUM) 2 MG capsule Take 2 mg by mouth daily as needed for diarrhea or loose stools.    ? ondansetron (ZOFRAN) 8 MG tablet Take 1 tablet (8 mg total) by mouth 2 (two) times daily as needed (nausea and vomiting). May take 30-60 minutes prior to Temodar administration if nausea/vomiting occurs. 30 tablet 1  ? simvastatin (ZOCOR) 20 MG tablet Take 1 tablet (20 mg total) by mouth daily. (Patient taking differently: Take 20 mg by mouth daily with lunch.) 90 tablet 3  ? temozolomide (TEMODAR) 180 MG capsule Take 2 capsules (360 mg total) by mouth daily for 5 days. May take on an empty stomach to decrease nausea & vomiting. 10 capsule 0  ? ?No current facility-administered medications on file prior to visit.  ? ? ?Allergies:  ?Allergies  ?Allergen Reactions  ? Lipitor [Atorvastatin] Other (See Comments)  ?  Caused DOUBLE VISION ?  ? ?Past Medical History:  ?Past Medical History:  ?Diagnosis Date  ? Actinic keratosis   ? scalp  ? Arthritis   ? ASD (atrial septal defect) 06/10/2015  ? Atrial flutter, paroxysmal ([place] 08/01/2015  ? [place] right [place] 06/05/2021  ? Chronic anticoagulation 08/29/2015  ?  Chronic pain of right knee 12/07/2014  ? Dysplastic nevus 01/11/2015  ? upper back spinal  ? Dysplastic nevus 01/29/2017  ? right superior calf inferior to popliteal  ? Dysrhythmia   ? AFIB  ? Goals of care, counseling/discussion 09/17/2020  ? Hypertension   ? Kidney stones   ? Obstructive sleep apnea syndrome 06/26/2015  ? PAF (paroxysmal atrial fibrillation) ([place]) 07/30/2015  ? Pericarditis   ? 1999  ? PFO (patent foramen ovale) 06/16/2018  ? Preoperative  cardiovascular examination 02/03/2016  ? 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 Shortness of breath dyspnea   ? W/ EXERTION   ? Sleep apnea   ? CPAP  ? Stroke [facility])   ? 04-2015  ? ?Past Surgical History:  ?Past Surgical History:  ?Procedure Laterality Date  ? APPLICATION OF CRANIAL NAVIGATION Left 09/18/2020  ? Procedure: APPLICATION OF CRANIAL NAVIGATION;  Surgeon: Consuella Lose, MD;  Location: [facility];  Service: Neurosurgery;  Laterality: Left;  ? COLONOSCOPY  04/10/2011  ? Moderate predominantly sigmoid diverticulosis. Small internal hemorrhoids.   ? COLONOSCOPY  01/30/2020  ? CRANIOTOMY Left 09/18/2020  ? Procedure: LEFT FRONTAL CRANIOTOMY FOR  TUMOR;  Surgeon: Consuella Lose, MD;  Location: [facility];  Service: Neurosurgery;  Laterality: Left;  left  ? CYST REMOVAL NECK    ? AGE 2  ? ESOPHAGOGASTRODUODENOSCOPY  04/10/2011  ? Erosive esophagitis with esophageal stricture (asymptomatic strictures since the patient not having any dysphagia). LA grade D esophagitis.  ? INGUINAL HERNIA REPAIR    ? LEFT AS CHILD  ? ROBOTIC ASSITED PARTIAL NEPHRECTOMY Right 08/28/2021  ? Procedure: XI ROBOTIC ASSISTED PARTIAL NEPHRECTOMY AND RENAL CYST DECORTICATION WITH INTRAOPERTATIVE ULTRASOUND;  Surgeon: Alexis Frock, MD;  [facility];  Service: Urology;  Laterality: Right;  3 HRS  ? TOTAL KNEE ARTHROPLASTY Right 03/17/2016  ? Procedure: TOTAL KNEE ARTHROPLASTY;  Surgeon: [name], MD;  Location: [facility];  Service: Orthopedics;  Laterality: Right;  ? watchman procedure  07/2019  ? no longer needs Eliquis  ? ?Social History:  ?Social History  ? ?Socioeconomic History  ? Marital status: Married  ?  Spouse name: Not on file  ? Number of children: Not on file  ? Years of education: Not on file  ? Highest education level: Not on file  ?Occupational History  ? Not on file  ?Tobacco Use  ? Smoking status: Never  ? Smokeless tobacco: Never  ?Vaping Use  ? Vaping Use: Never used  ?Substance and Sexual  Activity  ? Alcohol use: No  ? Drug use: No  ? Sexual activity: Not on file  ?Other Topics Concern  ? Not on file  ?Social History Narrative  ? Not on file  ? ?Social Determinants of Health  ? ?Financial Resource Strain: Not on file  ?Food Insecurity: Not on file  ?Transportation Needs: Not on file  ?Physical Activity: Not on file  ?Stress: Not on file  ?Social Connections: Not on file  ?Intimate Partner Violence: Not on file  ? ?Family History:  ?Family History  ?Problem Relation Age of Onset  ? Lung cancer Father   ? Colon cancer Neg Hx   ? Esophageal cancer Neg Hx   ? Colon polyps Neg Hx   ? Rectal cancer Neg Hx   ? Stomach cancer Neg Hx   ? ? ?Review of Systems: ?Constitutional: Doesn't report fevers, chills or abnormal weight loss ?Eyes: Doesn't report blurriness of vision ?Ears, nose, mouth, throat, and face:  Doesn't report sore throat ?Respiratory: Doesn't report cough, dyspnea or wheezes ?Cardiovascular: Doesn't report palpitation, chest discomfort  ?Gastrointestinal:  Doesn't report nausea, constipation, diarrhea ?GU: Doesn't report incontinence ?Skin: Doesn't report skin rashes ?Neurological: Per HPI ?Musculoskeletal: Doesn't report joint pain ?Behavioral/Psych: Doesn't report anxiety ? ?Physical Exam: ?Vitals:  ? 01/28/22 1152  ?BP: 129/75  ?Pulse: 68  ?Resp: 17  ?Temp: (!) 96.9 ?F (36.1 ?C)  ?SpO2: 100%  ? ?KPS: 80. ?General: Alert, cooperative, pleasant, in no acute distress ?Head: Normal ?EENT: No conjunctival injection or scleral icterus.  ?Lungs: Resp effort normal ?Cardiac: Regular rate ?Abdomen: Non-distended abdomen ?Skin: No rashes cyanosis or petechiae. ?Extremities: No clubbing or edema ? ?Neurologic Exam: ?Mental Status: Awake, alert, attentive to examiner. Oriented to self and environment. Language is fluent with intact comprehension.  ?Cranial Nerves: Visual acuity is grossly normal. Visual fields are full. Extra-ocular movements intact. No ptosis. Face is symmetric ?Motor: Tone and bulk are  normal. Power is full in both arms and legs. Reflexes are symmetric, no pathologic reflexes present.  ?Sensory: Intact to light touch ?Gait: Normal. ? ? ?Labs: ?I have reviewed the data as listed ?   ?C

## 2022-01-29 ENCOUNTER — Other Ambulatory Visit (HOSPITAL_COMMUNITY): Payer: Self-pay

## 2022-01-29 ENCOUNTER — Telehealth: Payer: Self-pay | Admitting: Internal Medicine

## 2022-01-29 NOTE — Telephone Encounter (Signed)
Scheduled per 3/14 los, pt has been called and wife confirmed appt ?

## 2022-01-31 ENCOUNTER — Telehealth: Payer: Self-pay | Admitting: Internal Medicine

## 2022-01-31 NOTE — Telephone Encounter (Signed)
.  Called patient to schedule appointment per 3/14 inbasket, patient is aware of date and time.   ?

## 2022-02-04 ENCOUNTER — Other Ambulatory Visit: Payer: Self-pay | Admitting: Radiation Therapy

## 2022-02-07 DIAGNOSIS — Z20822 Contact with and (suspected) exposure to covid-19: Secondary | ICD-10-CM | POA: Diagnosis not present

## 2022-02-14 ENCOUNTER — Other Ambulatory Visit (HOSPITAL_COMMUNITY): Payer: Self-pay

## 2022-02-21 ENCOUNTER — Other Ambulatory Visit (HOSPITAL_COMMUNITY): Payer: Self-pay | Admitting: Urology

## 2022-02-21 ENCOUNTER — Ambulatory Visit (HOSPITAL_COMMUNITY)
Admission: RE | Admit: 2022-02-21 | Discharge: 2022-02-21 | Disposition: A | Discharge status: Home / Self Care | Payer: Medicare Other | Source: Ambulatory Visit | Attending: Urology | Admitting: Urology

## 2022-02-21 ENCOUNTER — Ambulatory Visit (HOSPITAL_COMMUNITY)
Admission: RE | Admit: 2022-02-21 | Discharge: 2022-02-21 | Disposition: A | Discharge status: Home / Self Care | Payer: Medicare Other | Source: Ambulatory Visit | Attending: Internal Medicine | Admitting: Internal Medicine

## 2022-02-21 DIAGNOSIS — C711 Malignant neoplasm of frontal lobe: Secondary | ICD-10-CM | POA: Insufficient documentation

## 2022-02-21 DIAGNOSIS — D49511 Neoplasm of unspecified behavior of right kidney: Secondary | ICD-10-CM

## 2022-02-21 DIAGNOSIS — S064X0A Epidural hemorrhage without loss of consciousness, initial encounter: Secondary | ICD-10-CM | POA: Diagnosis not present

## 2022-02-21 DIAGNOSIS — M419 Scoliosis, unspecified: Secondary | ICD-10-CM | POA: Diagnosis not present

## 2022-02-21 DIAGNOSIS — I82220 Acute embolism and thrombosis of inferior vena cava: Secondary | ICD-10-CM | POA: Diagnosis not present

## 2022-02-21 DIAGNOSIS — Z9889 Other specified postprocedural states: Secondary | ICD-10-CM | POA: Diagnosis not present

## 2022-02-21 DIAGNOSIS — Z86718 Personal history of other venous thrombosis and embolism: Secondary | ICD-10-CM | POA: Diagnosis not present

## 2022-02-21 DIAGNOSIS — G9389 Other specified disorders of brain: Secondary | ICD-10-CM | POA: Diagnosis not present

## 2022-02-21 DIAGNOSIS — I4891 Unspecified atrial fibrillation: Secondary | ICD-10-CM | POA: Diagnosis not present

## 2022-02-21 DIAGNOSIS — M47814 Spondylosis without myelopathy or radiculopathy, thoracic region: Secondary | ICD-10-CM | POA: Diagnosis not present

## 2022-02-21 MED ORDER — GADOBUTROL 1 MMOL/ML IV SOLN
7.0000 mL | Freq: Once | INTRAVENOUS | Status: AC | PRN
  Administered 2022-02-21: 7 mL via INTRAVENOUS

## 2022-02-24 ENCOUNTER — Inpatient Hospital Stay: Payer: Medicare Other | Attending: Internal Medicine

## 2022-02-24 DIAGNOSIS — C711 Malignant neoplasm of frontal lobe: Secondary | ICD-10-CM | POA: Insufficient documentation

## 2022-02-24 DIAGNOSIS — Z79899 Other long term (current) drug therapy: Secondary | ICD-10-CM | POA: Insufficient documentation

## 2022-02-24 DIAGNOSIS — Z923 Personal history of irradiation: Secondary | ICD-10-CM | POA: Insufficient documentation

## 2022-02-28 DIAGNOSIS — R278 Other lack of coordination: Secondary | ICD-10-CM | POA: Diagnosis not present

## 2022-02-28 DIAGNOSIS — R41841 Cognitive communication deficit: Secondary | ICD-10-CM | POA: Diagnosis not present

## 2022-02-28 DIAGNOSIS — E440 Moderate protein-calorie malnutrition: Secondary | ICD-10-CM | POA: Diagnosis not present

## 2022-02-28 DIAGNOSIS — N183 Chronic kidney disease, stage 3 unspecified: Secondary | ICD-10-CM | POA: Diagnosis not present

## 2022-02-28 DIAGNOSIS — R0902 Hypoxemia: Secondary | ICD-10-CM | POA: Diagnosis not present

## 2022-02-28 DIAGNOSIS — I6523 Occlusion and stenosis of bilateral carotid arteries: Secondary | ICD-10-CM | POA: Diagnosis not present

## 2022-02-28 DIAGNOSIS — Z888 Allergy status to other drugs, medicaments and biological substances status: Secondary | ICD-10-CM | POA: Diagnosis not present

## 2022-02-28 DIAGNOSIS — J189 Pneumonia, unspecified organism: Secondary | ICD-10-CM | POA: Diagnosis not present

## 2022-02-28 DIAGNOSIS — G40803 Other epilepsy, intractable, with status epilepticus: Secondary | ICD-10-CM | POA: Diagnosis not present

## 2022-02-28 DIAGNOSIS — M6281 Muscle weakness (generalized): Secondary | ICD-10-CM | POA: Diagnosis not present

## 2022-02-28 DIAGNOSIS — I639 Cerebral infarction, unspecified: Secondary | ICD-10-CM | POA: Diagnosis not present

## 2022-02-28 DIAGNOSIS — R2681 Unsteadiness on feet: Secondary | ICD-10-CM | POA: Diagnosis not present

## 2022-02-28 DIAGNOSIS — Z9221 Personal history of antineoplastic chemotherapy: Secondary | ICD-10-CM | POA: Diagnosis not present

## 2022-02-28 DIAGNOSIS — Z7901 Long term (current) use of anticoagulants: Secondary | ICD-10-CM | POA: Diagnosis not present

## 2022-02-28 DIAGNOSIS — Z20828 Contact with and (suspected) exposure to other viral communicable diseases: Secondary | ICD-10-CM | POA: Diagnosis not present

## 2022-02-28 DIAGNOSIS — G40901 Epilepsy, unspecified, not intractable, with status epilepticus: Secondary | ICD-10-CM | POA: Diagnosis not present

## 2022-02-28 DIAGNOSIS — I10 Essential (primary) hypertension: Secondary | ICD-10-CM | POA: Diagnosis not present

## 2022-02-28 DIAGNOSIS — Z7401 Bed confinement status: Secondary | ICD-10-CM | POA: Diagnosis not present

## 2022-02-28 DIAGNOSIS — Z79899 Other long term (current) drug therapy: Secondary | ICD-10-CM | POA: Diagnosis not present

## 2022-02-28 DIAGNOSIS — R0689 Other abnormalities of breathing: Secondary | ICD-10-CM | POA: Diagnosis not present

## 2022-02-28 DIAGNOSIS — E785 Hyperlipidemia, unspecified: Secondary | ICD-10-CM | POA: Diagnosis not present

## 2022-02-28 DIAGNOSIS — C719 Malignant neoplasm of brain, unspecified: Secondary | ICD-10-CM | POA: Diagnosis not present

## 2022-02-28 DIAGNOSIS — I959 Hypotension, unspecified: Secondary | ICD-10-CM | POA: Diagnosis not present

## 2022-02-28 DIAGNOSIS — R569 Unspecified convulsions: Secondary | ICD-10-CM | POA: Diagnosis not present

## 2022-02-28 DIAGNOSIS — R404 Transient alteration of awareness: Secondary | ICD-10-CM | POA: Diagnosis not present

## 2022-03-03 ENCOUNTER — Other Ambulatory Visit (HOSPITAL_COMMUNITY): Payer: Self-pay

## 2022-03-03 ENCOUNTER — Inpatient Hospital Stay: Payer: Medicare Other

## 2022-03-03 ENCOUNTER — Other Ambulatory Visit: Payer: Self-pay | Admitting: Internal Medicine

## 2022-03-03 ENCOUNTER — Inpatient Hospital Stay: Payer: Medicare Other | Admitting: Internal Medicine

## 2022-03-03 ENCOUNTER — Telehealth: Payer: Self-pay | Admitting: Internal Medicine

## 2022-03-03 DIAGNOSIS — C711 Malignant neoplasm of frontal lobe: Secondary | ICD-10-CM

## 2022-03-03 DIAGNOSIS — Z20828 Contact with and (suspected) exposure to other viral communicable diseases: Secondary | ICD-10-CM | POA: Diagnosis not present

## 2022-03-03 NOTE — Telephone Encounter (Signed)
Per 4/17 in basket called and spoke with pt wife.  Pt wife confirmed appointment  ?

## 2022-03-03 NOTE — Telephone Encounter (Signed)
Patient and wife are aware that Patient must have labs and see provider before another round of oral chemotherapy is ordered. ?

## 2022-03-06 ENCOUNTER — Telehealth: Payer: Self-pay | Admitting: *Deleted

## 2022-03-06 DIAGNOSIS — R2681 Unsteadiness on feet: Secondary | ICD-10-CM | POA: Diagnosis not present

## 2022-03-06 DIAGNOSIS — E440 Moderate protein-calorie malnutrition: Secondary | ICD-10-CM | POA: Diagnosis not present

## 2022-03-06 DIAGNOSIS — G40909 Epilepsy, unspecified, not intractable, without status epilepticus: Secondary | ICD-10-CM | POA: Diagnosis not present

## 2022-03-06 DIAGNOSIS — Z923 Personal history of irradiation: Secondary | ICD-10-CM | POA: Diagnosis not present

## 2022-03-06 DIAGNOSIS — C719 Malignant neoplasm of brain, unspecified: Secondary | ICD-10-CM | POA: Diagnosis not present

## 2022-03-06 DIAGNOSIS — R41841 Cognitive communication deficit: Secondary | ICD-10-CM | POA: Diagnosis not present

## 2022-03-06 DIAGNOSIS — Z7189 Other specified counseling: Secondary | ICD-10-CM | POA: Diagnosis not present

## 2022-03-06 DIAGNOSIS — J189 Pneumonia, unspecified organism: Secondary | ICD-10-CM | POA: Diagnosis not present

## 2022-03-06 DIAGNOSIS — R262 Difficulty in walking, not elsewhere classified: Secondary | ICD-10-CM | POA: Diagnosis not present

## 2022-03-06 DIAGNOSIS — N183 Chronic kidney disease, stage 3 unspecified: Secondary | ICD-10-CM | POA: Diagnosis not present

## 2022-03-06 DIAGNOSIS — R569 Unspecified convulsions: Secondary | ICD-10-CM | POA: Diagnosis not present

## 2022-03-06 DIAGNOSIS — Z20822 Contact with and (suspected) exposure to covid-19: Secondary | ICD-10-CM | POA: Diagnosis not present

## 2022-03-06 DIAGNOSIS — Z79899 Other long term (current) drug therapy: Secondary | ICD-10-CM | POA: Diagnosis not present

## 2022-03-06 DIAGNOSIS — I959 Hypotension, unspecified: Secondary | ICD-10-CM | POA: Diagnosis not present

## 2022-03-06 DIAGNOSIS — C711 Malignant neoplasm of frontal lobe: Secondary | ICD-10-CM | POA: Diagnosis not present

## 2022-03-06 DIAGNOSIS — M6281 Muscle weakness (generalized): Secondary | ICD-10-CM | POA: Diagnosis not present

## 2022-03-06 DIAGNOSIS — Z7401 Bed confinement status: Secondary | ICD-10-CM | POA: Diagnosis not present

## 2022-03-06 DIAGNOSIS — R278 Other lack of coordination: Secondary | ICD-10-CM | POA: Diagnosis not present

## 2022-03-06 DIAGNOSIS — I119 Hypertensive heart disease without heart failure: Secondary | ICD-10-CM | POA: Diagnosis not present

## 2022-03-06 DIAGNOSIS — E785 Hyperlipidemia, unspecified: Secondary | ICD-10-CM | POA: Diagnosis not present

## 2022-03-06 DIAGNOSIS — G40803 Other epilepsy, intractable, with status epilepticus: Secondary | ICD-10-CM | POA: Diagnosis not present

## 2022-03-06 NOTE — Telephone Encounter (Signed)
Received [facility] from [facility].  Patient is due to discharge today to [facility] in [place].  Wife states he hasn't regained to baseline level of strength and he isn't as sharp as he normally is.  She is aware of the upcoming appointment next week with [place]. ?

## 2022-03-10 ENCOUNTER — Other Ambulatory Visit: Payer: Self-pay | Admitting: *Deleted

## 2022-03-10 ENCOUNTER — Other Ambulatory Visit (HOSPITAL_COMMUNITY): Payer: Self-pay

## 2022-03-10 DIAGNOSIS — G40909 Epilepsy, unspecified, not intractable, without status epilepticus: Secondary | ICD-10-CM | POA: Diagnosis not present

## 2022-03-10 DIAGNOSIS — I119 Hypertensive heart disease without heart failure: Secondary | ICD-10-CM | POA: Diagnosis not present

## 2022-03-10 DIAGNOSIS — R262 Difficulty in walking, not elsewhere classified: Secondary | ICD-10-CM | POA: Diagnosis not present

## 2022-03-10 DIAGNOSIS — E785 Hyperlipidemia, unspecified: Secondary | ICD-10-CM | POA: Diagnosis not present

## 2022-03-10 NOTE — Patient Outreach (Addendum)
Per [place] eligible member currently resides in [place].  Screening for potential [facility] Care Management services as a benefit of member's insurance plan. ? ?[name] admitted to SNF on 03/06/22 after hospitalization. ? ?Secure communication sent to facility SW to make aware writer is following for transition plans and [facility] needs. ? ?Will continue to follow while member resides in SNF.  ? ?Addendum: Received call from Mathew Leach (spouse/DPR) stating writer's notes came up in [place] on the AVS.  Mrs. Mathew Leach explained writer's role and purpose of communication to SNF SW. Explained [place] Management services. Writer to email Mathew Leach Management brochure, website address, and contact information. Mathew Leach expresses concern that she would be charged for writer's services. Explained to Mathew Leach this is not the case. Will also forward Mathew Leach's concerns to [place] Management leadership. 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 No identifiable [facility] Care Management needs at this time.  ? ? ? ?Mathew Rolling, MSN, RN,BSN ?[place] Coordinator ?[phone] [facility]) ?[phone]  (Toll free office)   ?

## 2022-03-13 ENCOUNTER — Inpatient Hospital Stay (HOSPITAL_BASED_OUTPATIENT_CLINIC_OR_DEPARTMENT_OTHER): Payer: Medicare Other | Admitting: Internal Medicine

## 2022-03-13 ENCOUNTER — Inpatient Hospital Stay: Payer: Medicare Other

## 2022-03-13 ENCOUNTER — Telehealth: Payer: Self-pay | Admitting: Pharmacist

## 2022-03-13 ENCOUNTER — Other Ambulatory Visit: Payer: Self-pay

## 2022-03-13 ENCOUNTER — Other Ambulatory Visit (HOSPITAL_COMMUNITY): Payer: Self-pay

## 2022-03-13 ENCOUNTER — Telehealth: Payer: Self-pay

## 2022-03-13 VITALS — BP 125/72 | HR 79 | Temp 98.2°F | Resp 18 | Wt 166.6 lb

## 2022-03-13 DIAGNOSIS — C711 Malignant neoplasm of frontal lobe: Secondary | ICD-10-CM | POA: Diagnosis not present

## 2022-03-13 DIAGNOSIS — R569 Unspecified convulsions: Secondary | ICD-10-CM

## 2022-03-13 DIAGNOSIS — Z79899 Other long term (current) drug therapy: Secondary | ICD-10-CM | POA: Diagnosis not present

## 2022-03-13 DIAGNOSIS — Z7189 Other specified counseling: Secondary | ICD-10-CM

## 2022-03-13 DIAGNOSIS — Z923 Personal history of irradiation: Secondary | ICD-10-CM | POA: Diagnosis not present

## 2022-03-13 LAB — CMP (CANCER CENTER ONLY)
ALT: 147 U/L — ABNORMAL HIGH (ref 0–44)
AST: 74 U/L — ABNORMAL HIGH (ref 15–41)
Albumin: 3.8 g/dL (ref 3.5–5.0)
Alkaline Phosphatase: 117 U/L (ref 38–126)
Anion gap: 8 (ref 5–15)
BUN: 16 mg/dL (ref 8–23)
CO2: 28 mmol/L (ref 22–32)
Calcium: 8.8 mg/dL — ABNORMAL LOW (ref 8.9–10.3)
Chloride: 107 mmol/L (ref 98–111)
Creatinine: 1.14 mg/dL (ref 0.61–1.24)
GFR, Estimated: >60 mL/min (ref >60)
Glucose, Bld: 117 mg/dL — ABNORMAL HIGH (ref 70–99)
Potassium: 3.8 mmol/L (ref 3.5–5.1)
Sodium: 143 mmol/L (ref 135–145)
Total Bilirubin: 0.3 mg/dL (ref 0.3–1.2)
Total Protein: 6.6 g/dL (ref 6.5–8.1)

## 2022-03-13 LAB — CBC WITH DIFFERENTIAL (CANCER CENTER ONLY)
Abs Immature Granulocytes: 0.38 10*3/uL — ABNORMAL HIGH (ref 0.00–0.07)
Basophils Absolute: 0.1 10*3/uL (ref 0.0–0.1)
Basophils Relative: 1 %
Eosinophils Absolute: 0.1 10*3/uL (ref 0.0–0.5)
Eosinophils Relative: 1 %
HCT: 41.9 % (ref 39.0–52.0)
Hemoglobin: 14.1 g/dL (ref 13.0–17.0)
Immature Granulocytes: 3 %
Lymphocytes Relative: 13 %
Lymphs Abs: 1.6 10*3/uL (ref 0.7–4.0)
MCH: 30.9 pg (ref 26.0–34.0)
MCHC: 33.7 g/dL (ref 30.0–36.0)
MCV: 91.7 fL (ref 80.0–100.0)
Monocytes Absolute: 0.9 10*3/uL (ref 0.1–1.0)
Monocytes Relative: 8 %
Neutro Abs: 8.6 10*3/uL — ABNORMAL HIGH (ref 1.7–7.7)
Neutrophils Relative %: 74 %
Platelet Count: 240 10*3/uL (ref 150–400)
RBC: 4.57 MIL/uL (ref 4.22–5.81)
RDW: 13.3 % (ref 11.5–15.5)
WBC Count: 11.7 10*3/uL — ABNORMAL HIGH (ref 4.0–10.5)
nRBC: 0 % (ref 0.0–0.2)

## 2022-03-13 MED ORDER — LOMUSTINE 100 MG PO CAPS
100.0000 mg/m2 | ORAL_CAPSULE | Freq: Once | ORAL | 0 refills | Status: AC
  Filled 2022-03-13: qty 2, 1d supply, fill #0
  Filled 2022-03-14: qty 2, 28d supply, fill #0

## 2022-03-13 MED ORDER — ONDANSETRON HCL 8 MG PO TABS
8.0000 mg | ORAL_TABLET | Freq: Two times a day (BID) | ORAL | 1 refills | Status: DC | PRN
  Filled 2022-03-13 – 2022-03-14 (×2): qty 30, 15d supply, fill #0

## 2022-03-13 MED ORDER — ONDANSETRON HCL 8 MG PO TABS
8.0000 mg | ORAL_TABLET | Freq: Two times a day (BID) | ORAL | 1 refills | Status: DC | PRN
  Filled 2022-03-13: qty 30, 15d supply, fill #0

## 2022-03-13 NOTE — Progress Notes (Signed)
DISCONTINUE ON PATHWAY REGIMEN - Neuro     A cycle is every 28 days:     Temozolomide      Temozolomide   **Always confirm dose/schedule in your pharmacy ordering system**  REASON: Disease Progression PRIOR TREATMENT: [id number]: Temozolomide 150/200 mg/m2 PO D1-5 q28 Days x up to 12 Cycles TREATMENT RESPONSE: Progressive Disease (PD)  START ON PATHWAY REGIMEN - Neuro     A cycle is every 42 days:     Lomustine   **Always confirm dose/schedule in your pharmacy ordering system**  Patient Characteristics: Glioma, Glioblastoma, IDH-wildtype, Recurrent or Progressive, Nonsurgical Candidate, Systemic Therapy Candidate, BRAF V600E Mutation Negative/Unknown and NTRK Fusion Negative/Unknown Disease Classification: Glioma Disease Classification: Glioblastoma, IDH-wildtype Disease Status: Recurrent or Progressive Treatment Classification: Nonsurgical Candidate Treatment (Nonsurgical/Adjuvant): Systemic Therapy Candidate NTRK Gene Fusion Status: Negative BRAF V600E Mutation Status: Negative Intent of Therapy: Non-Curative / Palliative Intent, Discussed with Patient 

## 2022-03-13 NOTE — Telephone Encounter (Signed)
Error

## 2022-03-13 NOTE — Telephone Encounter (Signed)
Oral Oncology Patient Advocate Encounter ? ?After completing a benefits investigation, prior authorization for Gleostine is not required at this time through [place]. ? ?Patient's copay is $38.   ? ? ?Wynn Maudlin CPHT ?Specialty Pharmacy Patient Advocate ?[place] ?Phone [phone] ?Fax [phone] ?03/13/2022 2:05 PM ? ?  ? ?

## 2022-03-13 NOTE — Progress Notes (Signed)
? ?[place] at [address]  ?[place], [place] [address] ?[phone] ? ? ?Interval Evaluation ? ?Date of Service: 03/13/22 ?Patient Name: Mathew Leach ?Patient MRN: [id number] ?Patient DOB: 02-04-1951 ?Provider: Ventura Sellers, MD ? ?Identifying Statement:  ?[name] is a 71 y.o. male with left frontal glioblastoma  ? ?Referring Provider: ? ?Oncologic History: ?Oncology History  ?Frontal glioblastoma multiforme (HCC)  ?09/18/2020 Surgery  ? Left frontal craniotomy, resection by Dr. Kathyrn Sheriff; path demonstrates Glioblastaoma IDH-wt ?  ?10/17/2020 - 11/06/2020 Radiation Therapy  ? Completes abbreviated 15 fraction IMRT w/ concurrent Temodar   ?  ? Chemotherapy  ? Patient is on Treatment Plan :  ?BRAIN GLIOBLASTOMA Post XRT Temozolomide Days 1-5 q28 Days    ?  ?12/31/2021 Progression  ? Progression of disease.  Plan to resume 5-day Temodar. ?  ?02/18/2022 Progression  ? Progression of disease confirmed on 1 month f/u MRI ?  ? ? ?Biomarkers: ? ?MGMT Unknown.  ?IDH 1/2 Wild type.  ?EGFR Unknown  ?TERT Unknown  ? ?Interval History: Mathew Leach presents to clinic today following cycle #8 of 5-day Temodar.  He is currently in rehab center after admission at [facility] for breakthrough siezures early last week, with post-ictal weakness.  Currently on Keppra 1522m BID and now Dilantin 1046mTID without further seizures.  No new or progressive neurologic deficits today aside from right sided weakness, which is static or improved. Otherwise remains mostly independent with gait, basic ADLs.  Decadron currently at 76m61mwice per day at the center. ? ?H+P (10/04/20)  [name] to medical attention three weeks ago with new onset generalized seizure.  This occurred at work, unwitnessed, and then second event was witnessed by EMS described as generalized shaking.  CNS imaging demonstrated left frontal mass lesion, which was resected by Dr.  NunKathyrn Sheriff 09/18/20.  Following surgery, he had no clinical complaints.  Workup did also demonstrate mass within kidney possibly c/w renal cell carcinoma.  He presents today for path review; he is fully independent for age, lives with his wife.  ? ?Medications: ?Current Outpatient Medications on File Prior to Visit  ?Medication Sig Dispense Refill  ? amLODipine (NORVASC) 10 MG tablet Take 1 tablet by mouth daily.    ? dexamethasone (DECADRON) 4 MG tablet Take 4 mg by mouth 2 (two) times daily.    ? levETIRAcetam (KEPPRA) 750 MG tablet Take 2 tablets (1,500 mg total) by mouth 2 (two) times daily. 90 tablet 0  ? loperamide (IMODIUM) 2 MG capsule Take 2 mg by mouth daily as needed for diarrhea or loose stools.    ? LORazepam (ATIVAN) 1 MG tablet Take 1 tablet (1 mg total) by mouth every 8 (eight) hours as needed for seizure. 10 tablet 0  ? ondansetron (ZOFRAN) 8 MG tablet Take 1 tablet (8 mg total) by mouth 2 (two) times daily as needed (nausea and vomiting). May take 30-60 minutes prior to Temodar administration if nausea/vomiting occurs. 30 tablet 1  ? phenytoin (DILANTIN) 100 MG ER capsule Take 100 mg by mouth 3 (three) times daily.    ? simvastatin (ZOCOR) 20 MG tablet Take 1 tablet (20 mg total) by mouth daily. (Patient taking differently: Take 20 mg by mouth daily with lunch.) 90 tablet 3  ? ?No current facility-administered medications on file prior to visit.  ? ? ?Allergies:  ?Allergies  ?Allergen Reactions  ? Lipitor [Atorvastatin] Other (See Comments)  ?  Caused  DOUBLE VISION ?  ? ?Past Medical History:  ?Past Medical History:  ?Diagnosis Date  ? Actinic keratosis   ? scalp  ? Arthritis   ? ASD (atrial septal defect) 06/10/2015  ? Atrial flutter, paroxysmal ([place]) 08/01/2015  ? Cancer of right kidney ([place]) 06/05/2021  ? Chronic anticoagulation 08/29/2015  ? Chronic pain of right knee 12/07/2014  ? Dysplastic nevus 01/11/2015  ? upper back spinal  ? Dysplastic nevus 01/29/2017  ? right superior calf inferior to  popliteal  ? Dysrhythmia   ? AFIB  ? Goals of care, counseling/discussion 09/17/2020  ? Hypertension   ? Kidney stones   ? Obstructive sleep apnea syndrome 06/26/2015  ? PAF (paroxysmal atrial fibrillation) ([place]) 07/30/2015  ? Pericarditis   ? 1999  ? PFO (patent foramen ovale) 06/16/2018  ? Preoperative cardiovascular examination 02/03/2016  ? 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 Shortness of breath dyspnea   ? W/ EXERTION   ? Sleep apnea   ? CPAP  ? Stroke [facility])   ? 04-2015  ? ?Past Surgical History:  ?Past Surgical History:  ?Procedure Laterality Date  ? APPLICATION OF CRANIAL NAVIGATION Left 09/18/2020  ? Procedure: APPLICATION OF CRANIAL NAVIGATION;  Surgeon: Consuella Lose, MD;  Location: [facility];  Service: Neurosurgery;  Laterality: Left;  ? COLONOSCOPY  04/10/2011  ? Moderate predominantly sigmoid diverticulosis. Small internal hemorrhoids.   ? COLONOSCOPY  01/30/2020  ? CRANIOTOMY Left 09/18/2020  ? Procedure: LEFT FRONTAL CRANIOTOMY FOR  TUMOR;  Surgeon: Consuella Lose, MD;  Location: [facility];  Service: Neurosurgery;  Laterality: Left;  left  ? CYST REMOVAL NECK    ? AGE 82  ? ESOPHAGOGASTRODUODENOSCOPY  04/10/2011  ? Erosive esophagitis with esophageal stricture (asymptomatic strictures since the patient not having any dysphagia). LA grade D esophagitis.  ? INGUINAL HERNIA REPAIR    ? LEFT AS CHILD  ? ROBOTIC ASSITED PARTIAL NEPHRECTOMY Right 08/28/2021  ? Procedure: XI ROBOTIC ASSISTED PARTIAL NEPHRECTOMY AND RENAL CYST DECORTICATION WITH INTRAOPERTATIVE ULTRASOUND;  Surgeon: Alexis Frock, MD;  [facility];  Service: Urology;  Laterality: Right;  3 HRS  ? TOTAL KNEE ARTHROPLASTY Right 03/17/2016  ? Procedure: TOTAL KNEE ARTHROPLASTY;  Surgeon: [name], MD;  Location: [facility];  Service: Orthopedics;  Laterality: Right;  ? watchman procedure  07/2019  ? no longer needs Eliquis  ? ?Social History:  ?Social History  ? ?Socioeconomic History  ? Marital status: Married  ?   Spouse name: Not on file  ? Number of children: Not on file  ? Years of education: Not on file  ? Highest education level: Not on file  ?Occupational History  ? Not on file  ?Tobacco Use  ? Smoking status: Never  ? Smokeless tobacco: Never  ?Vaping Use  ? Vaping Use: Never used  ?Substance and Sexual Activity  ? Alcohol use: No  ? Drug use: No  ? Sexual activity: Not on file  ?Other Topics Concern  ? Not on file  ?Social History Narrative  ? Not on file  ? ?Social Determinants of Health  ? ?Financial Resource Strain: Not on file  ?Food Insecurity: Not on file  ?Transportation Needs: Not on file  ?Physical Activity: Not on file  ?Stress: Not on file  ?Social Connections: Not on file  ?Intimate Partner Violence: Not on file  ? ?Family History:  ?Family History  ?Problem Relation Age of Onset  ? Lung cancer Father   ? Colon cancer Neg Hx   ?  Esophageal cancer Neg Hx   ? Colon polyps Neg Hx   ? Rectal cancer Neg Hx   ? Stomach cancer Neg Hx   ? ? ?Review of Systems: ?Constitutional: Doesn't report fevers, chills or abnormal weight loss ?Eyes: Doesn't report blurriness of vision ?Ears, nose, mouth, throat, and face: Doesn't report sore throat ?Respiratory: Doesn't report cough, dyspnea or wheezes ?Cardiovascular: Doesn't report palpitation, chest discomfort  ?Gastrointestinal:  Doesn't report nausea, constipation, diarrhea ?GU: Doesn't report incontinence ?Skin: Doesn't report skin rashes ?Neurological: Per HPI ?Musculoskeletal: Doesn't report joint pain ?Behavioral/Psych: Doesn't report anxiety ? ?Physical Exam: ?Vitals:  ? 03/13/22 1113  ?BP: 125/72  ?Pulse: 79  ?Resp: 18  ?Temp: 98.2 ?F (36.8 ?C)  ?SpO2: 100%  ? ?KPS: 70. ?General: Alert, cooperative, pleasant, in no acute distress ?Head: Normal ?EENT: No conjunctival injection or scleral icterus.  ?Lungs: Resp effort normal ?Cardiac: Regular rate ?Abdomen: Non-distended abdomen ?Skin: No rashes cyanosis or petechiae. ?Extremities: No clubbing or edema ? ?Neurologic  Exam: ?Mental Status: Awake, alert, attentive to examiner. Oriented to self and environment. Language is fluent with intact comprehension.  ?Cranial Nerves: Visual acuity is grossly normal. Visual fields are full.

## 2022-03-13 NOTE — Telephone Encounter (Signed)
Oral Oncology Pharmacy Student Encounter ? ?Received new prescription for lomustine (Gleostine) for the treatment of frontal glioblastoma multiforme, planned duration until disease progression or unacceptable toxicity. Disease progressed on temozolomide therapy. Plan is to start lomustine monotherapy. ? ?CBC/CMP from 03/13/22 assessed, elevated AST/ALT noted. CrCl of 56.7 mL/min was noted. No dose adjustment is necessary at the moment, will continue monitoring. Prescription dose and frequency assessed.  ? ?Current medication list in Epic reviewed, 1 DDIs with phenytoin identified: ?Category C: Fosphenytoin-Phenytoin may diminish the therapeutic effect of Lomustine. A retrospective analysis of glioblastoma patients treated with radiation and chemotherapy (primarily lomustine) reported that patients who had received enzyme-inducing antiseizure drugs (phenytoin, carbamazepine, or both) had a reduced survival rate compared with patients who received non-enzyme-inducing antiseizure drugs (primarily valproic acid). No dose adjustment or therapy modification is needed, will monitor therapy. ?Romie Levee, Piribauer M, Marosi C, Lahrmann H, Hitzenberger P, [place]. P450 enzyme inducing and non-enzyme inducing antiepileptics in glioblastoma patients treated with standard chemotherapy. J Neurooncol. 2005;72(3):255-260. ? ?Evaluated chart and no patient barriers to medication adherence identified.  ? ?Prescription has been e-scribed to the [facility] for benefits analysis and approval. ? ?Oral Oncology Clinic will continue to follow for insurance authorization, copayment issues, initial counseling and start date. ? ?Patient agreed to treatment on 03/13/22 per MD documentation. ? ?Adele Dan, PharmD Candidate ?Class of 2023 ?[place]/[facility]/AP Oral Chemotherapy Navigation Clinic ?[phone] ? ?03/13/2022 2:45 PM ? ?

## 2022-03-14 ENCOUNTER — Other Ambulatory Visit (HOSPITAL_COMMUNITY): Payer: Self-pay

## 2022-03-14 ENCOUNTER — Telehealth: Payer: Self-pay | Admitting: Internal Medicine

## 2022-03-14 NOTE — Telephone Encounter (Signed)
Oral Chemotherapy Pharmacist Encounter ? ?[facility] will deliver medication to Mathew Leach on Monday 03/17/22. ? ?Patient Education ?I spoke with patient's wife Mathew Leach for overview of new oral chemotherapy medication: lomustine  ? ?Counseled patient on administration, dosing, side effects, monitoring, drug-food interactions, safe handling, storage, and disposal. ?Patient will take 2 capsules (200 mg total) by mouth once for 1 dose. Take on an emtpy stomach 1 hour before or 2 hours after meals. ? ?*Suggested patient takes ondansetron 30-60 mins prior to lomustine to prevent nausea/vomiting ? ?Side effects include but not limited to: nausea and decreased wbc/plt.   ? ?Reviewed with Mathew Leach importance of keeping a medication schedule and plan for any missed doses. ? ?After discussion with Mathew Leach no patient barriers to medication adherence identified.  ? ?Mathew Leach  voiced understanding and appreciation. All questions answered. Medication handout provided. ? ?Provided [facility] with [facility] [place] [facility] phone number. Mathew Leach knows to call the office with questions or concerns. Oral Chemotherapy Navigation Clinic will continue to follow. ? ?Darl Pikes, PharmD, BCPS, BCOP, CPP ?Hematology/Oncology Clinical Pharmacist Practitioner ?[place]/[facility]/AP Oral Chemotherapy Navigation Clinic ?[phone] ? ?03/14/2022 1:30 PM ? ?

## 2022-03-14 NOTE — Telephone Encounter (Signed)
Per 4/28 los called and spoke to pt wife about appointment Pt wife confirmed appointment  ?

## 2022-03-18 ENCOUNTER — Other Ambulatory Visit: Payer: Self-pay | Admitting: Internal Medicine

## 2022-03-18 ENCOUNTER — Telehealth: Payer: Self-pay

## 2022-03-18 DIAGNOSIS — E440 Moderate protein-calorie malnutrition: Secondary | ICD-10-CM | POA: Diagnosis not present

## 2022-03-18 DIAGNOSIS — M810 Age-related osteoporosis without current pathological fracture: Secondary | ICD-10-CM | POA: Diagnosis not present

## 2022-03-18 DIAGNOSIS — Z20822 Contact with and (suspected) exposure to covid-19: Secondary | ICD-10-CM | POA: Diagnosis not present

## 2022-03-18 DIAGNOSIS — Z9049 Acquired absence of other specified parts of digestive tract: Secondary | ICD-10-CM | POA: Diagnosis not present

## 2022-03-18 DIAGNOSIS — N182 Chronic kidney disease, stage 2 (mild): Secondary | ICD-10-CM | POA: Diagnosis not present

## 2022-03-18 DIAGNOSIS — C719 Malignant neoplasm of brain, unspecified: Secondary | ICD-10-CM | POA: Diagnosis not present

## 2022-03-18 DIAGNOSIS — I129 Hypertensive chronic kidney disease with stage 1 through stage 4 chronic kidney disease, or unspecified chronic kidney disease: Secondary | ICD-10-CM | POA: Diagnosis not present

## 2022-03-18 DIAGNOSIS — E785 Hyperlipidemia, unspecified: Secondary | ICD-10-CM | POA: Diagnosis not present

## 2022-03-18 DIAGNOSIS — Z9181 History of falling: Secondary | ICD-10-CM | POA: Diagnosis not present

## 2022-03-18 DIAGNOSIS — G40909 Epilepsy, unspecified, not intractable, without status epilepticus: Secondary | ICD-10-CM | POA: Diagnosis not present

## 2022-03-18 MED ORDER — LORAZEPAM 1 MG PO TABS: 1.0000 mg | ORAL_TABLET | Freq: Three times a day (TID) | ORAL | 0 refills | Status: AC | PRN

## 2022-03-18 NOTE — Telephone Encounter (Signed)
Received call from Darrold Span at [facility] [phone]) . Patient recently released from [facility] with referral to home health services.  ? ?Elmyra Ricks RN called to inquire if Dr. Mickeal Skinner would sign the home health plan of care or if she should reach out to PCP, Dr. Nyra Capes. ? ?Wife requesting  a new prescription for Ativan (she can not find old prescription.)  ? ? ?Routed to Provider and Pod #4. ?

## 2022-03-19 DIAGNOSIS — R0602 Shortness of breath: Secondary | ICD-10-CM | POA: Diagnosis not present

## 2022-03-19 DIAGNOSIS — G40909 Epilepsy, unspecified, not intractable, without status epilepticus: Secondary | ICD-10-CM | POA: Diagnosis not present

## 2022-03-19 DIAGNOSIS — Z7689 Persons encountering health services in other specified circumstances: Secondary | ICD-10-CM | POA: Diagnosis not present

## 2022-03-19 DIAGNOSIS — C719 Malignant neoplasm of brain, unspecified: Secondary | ICD-10-CM | POA: Diagnosis not present

## 2022-03-20 ENCOUNTER — Encounter (HOSPITAL_COMMUNITY): Payer: Self-pay

## 2022-03-20 ENCOUNTER — Other Ambulatory Visit: Payer: Self-pay

## 2022-03-20 ENCOUNTER — Inpatient Hospital Stay (HOSPITAL_COMMUNITY)
Admission: EM | Admit: 2022-03-20 | Discharge: 2022-03-24 | DRG: 175 | Disposition: A | Discharge status: Home Health | Payer: Medicare Other | Source: Other Acute Inpatient Hospital | Attending: Internal Medicine | Admitting: Internal Medicine

## 2022-03-20 ENCOUNTER — Inpatient Hospital Stay (HOSPITAL_COMMUNITY): Payer: Medicare Other

## 2022-03-20 DIAGNOSIS — C711 Malignant neoplasm of frontal lobe: Secondary | ICD-10-CM | POA: Diagnosis present

## 2022-03-20 DIAGNOSIS — I2699 Other pulmonary embolism without acute cor pulmonale: Secondary | ICD-10-CM | POA: Diagnosis present

## 2022-03-20 DIAGNOSIS — Z79899 Other long term (current) drug therapy: Secondary | ICD-10-CM | POA: Diagnosis not present

## 2022-03-20 DIAGNOSIS — Z8673 Personal history of transient ischemic attack (TIA), and cerebral infarction without residual deficits: Secondary | ICD-10-CM

## 2022-03-20 DIAGNOSIS — Z9221 Personal history of antineoplastic chemotherapy: Secondary | ICD-10-CM | POA: Diagnosis not present

## 2022-03-20 DIAGNOSIS — Z923 Personal history of irradiation: Secondary | ICD-10-CM

## 2022-03-20 DIAGNOSIS — Z96651 Presence of right artificial knee joint: Secondary | ICD-10-CM | POA: Diagnosis present

## 2022-03-20 DIAGNOSIS — I480 Paroxysmal atrial fibrillation: Secondary | ICD-10-CM | POA: Diagnosis present

## 2022-03-20 DIAGNOSIS — Z7901 Long term (current) use of anticoagulants: Secondary | ICD-10-CM

## 2022-03-20 DIAGNOSIS — G40909 Epilepsy, unspecified, not intractable, without status epilepticus: Secondary | ICD-10-CM | POA: Diagnosis present

## 2022-03-20 DIAGNOSIS — I10 Essential (primary) hypertension: Secondary | ICD-10-CM | POA: Diagnosis present

## 2022-03-20 DIAGNOSIS — I2510 Atherosclerotic heart disease of native coronary artery without angina pectoris: Secondary | ICD-10-CM | POA: Diagnosis not present

## 2022-03-20 DIAGNOSIS — R0902 Hypoxemia: Secondary | ICD-10-CM | POA: Diagnosis not present

## 2022-03-20 DIAGNOSIS — Z801 Family history of malignant neoplasm of trachea, bronchus and lung: Secondary | ICD-10-CM | POA: Diagnosis not present

## 2022-03-20 DIAGNOSIS — Z66 Do not resuscitate: Secondary | ICD-10-CM | POA: Diagnosis present

## 2022-03-20 DIAGNOSIS — E785 Hyperlipidemia, unspecified: Secondary | ICD-10-CM | POA: Diagnosis present

## 2022-03-20 DIAGNOSIS — J9601 Acute respiratory failure with hypoxia: Secondary | ICD-10-CM | POA: Diagnosis present

## 2022-03-20 DIAGNOSIS — R918 Other nonspecific abnormal finding of lung field: Secondary | ICD-10-CM | POA: Diagnosis not present

## 2022-03-20 DIAGNOSIS — I2609 Other pulmonary embolism with acute cor pulmonale: Principal | ICD-10-CM

## 2022-03-20 DIAGNOSIS — Z905 Acquired absence of kidney: Secondary | ICD-10-CM

## 2022-03-20 DIAGNOSIS — G4733 Obstructive sleep apnea (adult) (pediatric): Secondary | ICD-10-CM | POA: Diagnosis present

## 2022-03-20 DIAGNOSIS — R569 Unspecified convulsions: Secondary | ICD-10-CM

## 2022-03-20 DIAGNOSIS — Z85528 Personal history of other malignant neoplasm of kidney: Secondary | ICD-10-CM

## 2022-03-20 DIAGNOSIS — I4892 Unspecified atrial flutter: Secondary | ICD-10-CM | POA: Diagnosis present

## 2022-03-20 DIAGNOSIS — J9600 Acute respiratory failure, unspecified whether with hypoxia or hypercapnia: Secondary | ICD-10-CM | POA: Diagnosis not present

## 2022-03-20 LAB — BASIC METABOLIC PANEL
Anion gap: 6 (ref 5–15)
BUN: 21 mg/dL (ref 8–23)
CO2: 24 mmol/L (ref 22–32)
Calcium: 8.9 mg/dL (ref 8.9–10.3)
Chloride: 114 mmol/L — ABNORMAL HIGH (ref 98–111)
Creatinine, Ser: 1.13 mg/dL (ref 0.61–1.24)
GFR, Estimated: >60 mL/min (ref >60)
Glucose, Bld: 104 mg/dL — ABNORMAL HIGH (ref 70–99)
Potassium: 3.9 mmol/L (ref 3.5–5.1)
Sodium: 144 mmol/L (ref 135–145)

## 2022-03-20 LAB — CBC WITH DIFFERENTIAL/PLATELET
Abs Immature Granulocytes: 0.05 10*3/uL (ref 0.00–0.07)
Basophils Absolute: 0.1 10*3/uL (ref 0.0–0.1)
Basophils Relative: 1 %
Eosinophils Absolute: 0.1 10*3/uL (ref 0.0–0.5)
Eosinophils Relative: 1 %
HCT: 44.2 % (ref 39.0–52.0)
Hemoglobin: 14.6 g/dL (ref 13.0–17.0)
Immature Granulocytes: 1 %
Lymphocytes Relative: 17 %
Lymphs Abs: 1.6 10*3/uL (ref 0.7–4.0)
MCH: 30.9 pg (ref 26.0–34.0)
MCHC: 33 g/dL (ref 30.0–36.0)
MCV: 93.4 fL (ref 80.0–100.0)
Monocytes Absolute: 0.5 10*3/uL (ref 0.1–1.0)
Monocytes Relative: 6 %
Neutro Abs: 6.7 10*3/uL (ref 1.7–7.7)
Neutrophils Relative %: 74 %
Platelets: 148 10*3/uL — ABNORMAL LOW (ref 150–400)
RBC: 4.73 MIL/uL (ref 4.22–5.81)
RDW: 13.6 % (ref 11.5–15.5)
WBC: 9 10*3/uL (ref 4.0–10.5)
nRBC: 0 % (ref 0.0–0.2)

## 2022-03-20 LAB — BRAIN NATRIURETIC PEPTIDE: B Natriuretic Peptide: 143.3 pg/mL — ABNORMAL HIGH (ref 0.0–100.0)

## 2022-03-20 LAB — PROTIME-INR
INR: 1.1 (ref 0.8–1.2)
Prothrombin Time: 13.9 seconds (ref 11.4–15.2)

## 2022-03-20 LAB — ECHOCARDIOGRAM COMPLETE
AR max vel: 2.16 cm2
AV Peak grad: 7.8 mmHg
Ao pk vel: 1.4 m/s
Area-P 1/2: 2.42 cm2
Height: 68 in
S' Lateral: 2.2 cm
Weight: 2656 oz

## 2022-03-20 LAB — TROPONIN I (HIGH SENSITIVITY)
Troponin I (High Sensitivity): 20 ng/L — ABNORMAL HIGH (ref <18)
Troponin I (High Sensitivity): 18 ng/L — ABNORMAL HIGH (ref <18)

## 2022-03-20 LAB — HEPARIN LEVEL (UNFRACTIONATED): Heparin Unfractionated: 0.57 IU/mL (ref 0.30–0.70)

## 2022-03-20 LAB — APTT: aPTT: 26 seconds (ref 24–36)

## 2022-03-20 LAB — MRSA NEXT GEN BY PCR, NASAL: MRSA by PCR Next Gen: NOT DETECTED

## 2022-03-20 MED ORDER — HEPARIN BOLUS VIA INFUSION
3000.0000 [IU] | Freq: Once | INTRAVENOUS | Status: AC
  Administered 2022-03-20: 3000 [IU] via INTRAVENOUS
  Filled 2022-03-20: qty 3000

## 2022-03-20 MED ORDER — DEXAMETHASONE 4 MG PO TABS
4.0000 mg | ORAL_TABLET | Freq: Every day | ORAL | Status: DC
  Administered 2022-03-20 – 2022-03-24 (×5): 4 mg via ORAL
  Filled 2022-03-20 (×2): qty 2
  Filled 2022-03-20 (×2): qty 1
  Filled 2022-03-20: qty 2

## 2022-03-20 MED ORDER — AMLODIPINE BESYLATE 10 MG PO TABS
10.0000 mg | ORAL_TABLET | Freq: Every day | ORAL | Status: DC
  Administered 2022-03-21 – 2022-03-24 (×4): 10 mg via ORAL
  Filled 2022-03-20 (×4): qty 1

## 2022-03-20 MED ORDER — ONDANSETRON HCL 4 MG/2ML IJ SOLN: 4.0000 mg | Freq: Four times a day (QID) | INTRAMUSCULAR | Status: DC | PRN

## 2022-03-20 MED ORDER — ONDANSETRON HCL 4 MG PO TABS: 4.0000 mg | ORAL_TABLET | Freq: Four times a day (QID) | ORAL | Status: DC | PRN

## 2022-03-20 MED ORDER — CHLORHEXIDINE GLUCONATE CLOTH 2 % EX PADS
6.0000 | MEDICATED_PAD | Freq: Every day | CUTANEOUS | Status: DC
  Administered 2022-03-21 – 2022-03-24 (×4): 6 via TOPICAL

## 2022-03-20 MED ORDER — LEVETIRACETAM 500 MG PO TABS
1500.0000 mg | ORAL_TABLET | Freq: Two times a day (BID) | ORAL | Status: DC
  Administered 2022-03-20 – 2022-03-24 (×8): 1500 mg via ORAL
  Filled 2022-03-20 (×8): qty 3

## 2022-03-20 MED ORDER — ORAL CARE MOUTH RINSE
15.0000 mL | Freq: Two times a day (BID) | OROMUCOSAL | Status: DC
  Administered 2022-03-20 – 2022-03-24 (×8): 15 mL via OROMUCOSAL

## 2022-03-20 MED ORDER — ACETAMINOPHEN 325 MG PO TABS: 650.0000 mg | ORAL_TABLET | Freq: Four times a day (QID) | ORAL | Status: DC | PRN

## 2022-03-20 MED ORDER — SIMVASTATIN 20 MG PO TABS
20.0000 mg | ORAL_TABLET | Freq: Every day | ORAL | Status: DC
  Administered 2022-03-20 – 2022-03-23 (×4): 20 mg via ORAL
  Filled 2022-03-20: qty 1
  Filled 2022-03-20 (×2): qty 2
  Filled 2022-03-20: qty 1

## 2022-03-20 MED ORDER — ACETAMINOPHEN 650 MG RE SUPP
650.0000 mg | Freq: Four times a day (QID) | RECTAL | Status: DC | PRN
Start: 2022-03-20 — End: 2022-03-24

## 2022-03-20 MED ORDER — HEPARIN (PORCINE) 25000 UT/250ML-% IV SOLN
1100.0000 [IU]/h | INTRAVENOUS | Status: DC
  Administered 2022-03-20 – 2022-03-21 (×2): 1200 [IU]/h via INTRAVENOUS
  Administered 2022-03-22: 1100 [IU]/h via INTRAVENOUS
  Filled 2022-03-20 (×3): qty 250

## 2022-03-20 MED ORDER — PHENYTOIN SODIUM EXTENDED 100 MG PO CAPS
100.0000 mg | ORAL_CAPSULE | Freq: Three times a day (TID) | ORAL | Status: DC
  Administered 2022-03-20 – 2022-03-23 (×9): 100 mg via ORAL
  Filled 2022-03-20 (×11): qty 1

## 2022-03-20 NOTE — TOC Initial Note (Signed)
Transition of Care (TOC) - Initial/Assessment Note  ? ? ?Patient Details  ?Name: Mathew Leach ?MRN: [id number] ?Date of Birth: 11/11/1951 ? ?Transition of Care (TOC) CM/SW Contact:    ?Tawanna Cooler, RN ?Phone Number: ?03/20/2022, 5:25 PM ? ?Clinical Narrative:                 ? ?Received a message from [facility] that patient is currently on service with them.   ?

## 2022-03-20 NOTE — Progress Notes (Addendum)
ANTICOAGULATION CONSULT NOTE - Initial Consult ? ?Pharmacy Consult for heparin ?Indication: pulmonary embolus ? ?Allergies  ?Allergen Reactions  ? Lipitor [Atorvastatin] Other (See Comments)  ?  Caused DOUBLE VISION ?  ? ? ?Patient Measurements: ?Height: '5\' 8"'$  (172.7 cm) ?Weight: 75.3 kg (166 lb) ?IBW/kg (Calculated) : 68.4 ?Heparin Dosing Weight: 75.3 kg ? ?Vital Signs: ?Temp: 97.6 ?F (36.4 ?C) (05/04 1108) ?Temp Source: Oral (05/04 1108) ?BP: 138/82 (05/04 1300) ?Pulse Rate: 71 (05/04 1300) ? ?Labs: ?Recent Labs  ?  03/20/22 ?1202  ?HGB 14.6  ?HCT 44.2  ?PLT 148*  ?CREATININE 1.13  ?TROPONINIHS 20*  ? ? ?Estimated Creatinine Clearance: 57.2 mL/min (by C-G formula based on SCr of 1.13 mg/dL). ? ? ?Medical History: ?Past Medical History:  ?Diagnosis Date  ? Actinic keratosis   ? scalp  ? Arthritis   ? ASD (atrial septal defect) 06/10/2015  ? Atrial flutter, paroxysmal ([place]) 08/01/2015  ? Cancer of right kidney ([place]) 06/05/2021  ? Chronic anticoagulation 08/29/2015  ? Chronic pain of right knee 12/07/2014  ? Dysplastic nevus 01/11/2015  ? upper back spinal  ? Dysplastic nevus 01/29/2017  ? right superior calf inferior to popliteal  ? Dysrhythmia   ? AFIB  ? Goals of care, counseling/discussion 09/17/2020  ? Hypertension   ? Kidney stones   ? Obstructive sleep apnea syndrome 06/26/2015  ? PAF (paroxysmal atrial fibrillation) ([place]) 07/30/2015  ? Pericarditis   ? 1999  ? PFO (patent foramen ovale) 06/16/2018  ? Preoperative cardiovascular examination 02/03/2016  ? 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 Shortness of breath dyspnea   ? W/ EXERTION   ? Sleep apnea   ? CPAP  ? Stroke [facility])   ? 04-2015  ? ? ?Medications:  ?Scheduled:  ? heparin  3,000 Units Intravenous Once  ? ? ?Assessment: ?71 yo male presenting with shortness of breath.  Patient had a CT scan done at PCP office this AM which showed bilateral PE with right heart strain (RV/LV ratio 1.5) with no pericardial effusion.  Patient reports he was  previously taking Eliquis for history of Afib, however has been off this medication for several years and no current anticoagulation.  Pharmacy is consulted to dose heparin for PE.  ? ?Baseline labs: Hgb 14 (stable), platelets low 148 (baseline ~200s), aPTT 26 seconds, INR 1.1 ? ?Goal of Therapy:  ?Heparin level 0.3-0.7 units/ml ?Monitor platelets by anticoagulation protocol: Yes ?  ?Plan:  ?Heparin 3000 units IV bolus ?Begin heparin infusion at 1200 units/hour ?F/u heparin level in 8 hours ?Monitor daily heparin level, CBC, signs/symptoms of bleeding ?F/u ability to transition to oral anticoagulation ? ?Dimple Nanas, PharmD ?03/20/2022 1:30 PM ? ?

## 2022-03-20 NOTE — ED Provider Notes (Signed)
?[facility] DEPT ?Provider Note ? ? ?CSN: [id number] ?Arrival date & time: 03/20/22  1057 ? ?  ? ?History ? ?Chief Complaint  ?Patient presents with  ? Shortness of Breath  ? ? ?Mathew Leach is a 71 y.o. male w/ hx of glioblastoma presenting to ED with shortness of breath.  Wife provides supplemental hx.  Reports pt released from rehab last Thursday, on Friday walking out of lunch abruptly began to feel short of breath.  Home health nurse noted hypoxia 3 days ago, and PCP yesterday ambulated pt noting desats to 70% walking.  CT PE performed today at [facility] showing"large bilateral PE with RV/LV ratio 1.5, with no pericardial effusion."   ? ?The patient was on Eliquis in the distant past for A-fib but has been off of this for several years and is not taking blood thinners. ? ?He is on steroids and receiving chemotherapy for glioblastoma of the brain.  His wife by supplemental history ? ?HPI ? ?  ? ?Home Medications ?Prior to Admission medications   ?Medication Sig Start Date End Date Taking? Authorizing Provider  ?amLODipine (NORVASC) 10 MG tablet Take 10 mg by mouth daily. 07/02/21  Yes [provider]  ?dexamethasone (DECADRON) 4 MG tablet Take 4 mg by mouth daily.   Yes [provider]  ?levETIRAcetam (KEPPRA) 750 MG tablet Take 2 tablets (1,500 mg total) by mouth 2 (two) times daily. 01/01/22  Yes Davonna Belling, MD  ?lomustine (GLEOSTINE) 100 MG capsule Take 200 mg by mouth as directed. Take on an emtpy stomach 1 hour before or 2 hours after meals. Caution:chemotherapy every 6 weeks 03/17/22  Yes Vaslow, Acey Lav, MD  ?loperamide (IMODIUM) 2 MG capsule Take 2 mg by mouth daily as needed for diarrhea or loose stools.   Yes [provider]  ?LORazepam (ATIVAN) 1 MG tablet Take 1 tablet (1 mg total) by mouth every 8 (eight) hours as needed for seizure. 03/18/22  Yes Vaslow, Acey Lav, MD  ?ondansetron (ZOFRAN) 8 MG tablet Take  1 tablet (8 mg total) by mouth 2 (two) times daily as needed for nausea or vomiting. 03/13/22  Yes Vaslow, Acey Lav, MD  ?phenytoin (DILANTIN) 100 MG ER capsule Take 100 mg by mouth 3 (three) times daily.   Yes [provider]  ?simvastatin (ZOCOR) 20 MG tablet Take 1 tablet (20 mg total) by mouth daily. ?Patient taking differently: Take 20 mg by mouth at bedtime. 07/16/20  Yes [name], MD  ?   ? ?Allergies    ?Lipitor [atorvastatin]   ? ?Review of Systems   ?Review of Systems ? ?Physical Exam ?Updated Vital Signs ?BP (!) 148/74 (BP Location: Right Arm)   Pulse 73   Temp 97.8 ?F (36.6 ?C) (Oral)   Resp 18   Ht '5\' 8"'$  (1.727 m)   Wt 70.9 kg   SpO2 96%   BMI 23.77 kg/m?  ?Physical Exam ?Constitutional:   ?   General: He is not in acute distress. ?HENT:  ?   Head: Normocephalic and atraumatic.  ?Eyes:  ?   Conjunctiva/sclera: Conjunctivae normal.  ?   Pupils: Pupils are equal, round, and reactive to light.  ?Cardiovascular:  ?   Rate and Rhythm: Normal rate and regular rhythm.  ?Pulmonary:  ?   Effort: Pulmonary effort is normal. No respiratory distress.  ?   Comments: 88% on room air, 95% on 2L [place] at rest ?Abdominal:  ?   General: There  is no distension.  ?   Tenderness: There is no abdominal tenderness.  ?Skin: ?   General: Skin is warm and dry.  ?Neurological:  ?   General: No focal deficit present.  ?   Mental Status: He is alert. Mental status is at baseline.  ?Psychiatric:     ?   Mood and Affect: Mood normal.     ?   Behavior: Behavior normal.  ? ? ?ED Results / Procedures / Treatments   ?Labs ?(all labs ordered are listed, but only abnormal results are displayed) ?Labs Reviewed  ?BASIC METABOLIC PANEL - Abnormal; Notable for the following components:  ?    Result Value  ? Chloride 114 (*)   ? Glucose, Bld 104 (*)   ? All other components within normal limits  ?CBC WITH DIFFERENTIAL/PLATELET - Abnormal; Notable for the following components:  ? Platelets 148 (*)   ? All other components  within normal limits  ?BRAIN NATRIURETIC PEPTIDE - Abnormal; Notable for the following components:  ? B Natriuretic Peptide 143.3 (*)   ? All other components within normal limits  ?TROPONIN I (HIGH SENSITIVITY) - Abnormal; Notable for the following components:  ? Troponin I (High Sensitivity) 20 (*)   ? All other components within normal limits  ?TROPONIN I (HIGH SENSITIVITY) - Abnormal; Notable for the following components:  ? Troponin I (High Sensitivity) 18 (*)   ? All other components within normal limits  ?APTT  ?PROTIME-INR  ?HEPARIN LEVEL (UNFRACTIONATED)  ?CBC  ?COMPREHENSIVE METABOLIC PANEL  ?HEPARIN LEVEL (UNFRACTIONATED)  ? ? ?EKG ?EKG Interpretation ? ?Date/Time:  Thursday Mar 20 2022 11:07:22 EDT ?Ventricular Rate:  75 ?PR Interval:  198 ?QRS Duration: 103 ?QT Interval:  429 ?QTC Calculation: 480 ?R Axis:   16 ?Text Interpretation: Sinus rhythm Abnormal T, consider ischemia, anterior leads Confirmed by Octaviano Glow [phone]) on 03/20/2022 12:38:52 PM ? ?Radiology ?ECHOCARDIOGRAM COMPLETE ? ?Result Date: 03/20/2022 ?   ECHOCARDIOGRAM REPORT   Patient Name:   Mathew Leach Date of Exam: 03/20/2022 Medical Rec #:  [id number]           Height:       68.0 in Accession #:    [id number]          Weight:       166.0 lb Date of Birth:  12-21-1950           BSA:          1.888 m? Patient Age:    6 years            BP:           137/84 mmHg Patient Gender: M                   HR:           73 bpm. Exam Location:  Inpatient Procedure: 2D Echo, Cardiac Doppler and Color Doppler Indications:    Pulmonary Embolism  History:        Patient has no prior history of Echocardiogram examinations.                 Arrythmias:Atrial Fibrillation; Risk Factors:Sleep Apnea and                 Hypertension.  Sonographer:    Jefferey Pica Referring Phys: [id number] [place]  1. Left ventricular ejection fraction, by estimation, is 60 to 65%. The left ventricle has normal function. The left ventricle has no  regional wall motion abnormalities. There is mild left ventricular hypertrophy. Left ventricular diastolic parameters are consistent with Grade I diastolic dysfunction (impaired relaxation).  2. Right ventricular systolic function is mildly reduced. The right ventricular size is normal. Tricuspid regurgitation signal is inadequate for assessing PA pressure.  3. The mitral valve is normal in structure. No evidence of mitral valve regurgitation.  4. The aortic valve is grossly normal. There is mild calcification of the aortic valve. Aortic valve regurgitation is not visualized.  5. The inferior vena cava is normal in size with <50% respiratory variability, suggesting right atrial pressure of 8 mmHg. Comparison(s): No prior Echocardiogram. FINDINGS  Left Ventricle: Left ventricular ejection fraction, by estimation, is 60 to 65%. The left ventricle has normal function. The left ventricle has no regional wall motion abnormalities. The left ventricular internal cavity size was normal in size. There is  mild left ventricular hypertrophy. Left ventricular diastolic parameters are consistent with Grade I diastolic dysfunction (impaired relaxation). Right Ventricle: The right ventricular size is normal. No increase in right ventricular wall thickness. Right ventricular systolic function is mildly reduced. Tricuspid regurgitation signal is inadequate for assessing PA pressure. Left Atrium: Left atrial size was normal in size. Right Atrium: Right atrial size was normal in size. Pericardium: There is no evidence of pericardial effusion. Mitral Valve: The mitral valve is normal in structure. No evidence of mitral valve regurgitation. Tricuspid Valve: The tricuspid valve is normal in structure. Tricuspid valve regurgitation is not demonstrated. Aortic Valve: The aortic valve is grossly normal. There is mild calcification of the aortic valve. Aortic valve regurgitation is not visualized. Aortic valve peak gradient measures 7.8  mmHg. Pulmonic Valve: The pulmonic valve was not well visualized. Pulmonic valve regurgitation is not visualized. Aorta: The aortic root and ascending aorta are structurally normal, with no evidence of dilitation. Ve

## 2022-03-20 NOTE — Progress Notes (Signed)
ANTICOAGULATION CONSULT NOTE - follow up ? ?Pharmacy Consult for heparin ?Indication: pulmonary embolus ? ?Allergies  ?Allergen Reactions  ? Lipitor [Atorvastatin] Other (See Comments)  ?  Caused DOUBLE VISION ?  ? ? ?Patient Measurements: ?Height: '5\' 8"'$  (172.7 cm) ?Weight: 70.9 kg (156 lb 4.9 oz) ?IBW/kg (Calculated) : 68.4 ?Heparin Dosing Weight: 75.3 kg ? ?Vital Signs: ?Temp: 97.5 ?F (36.4 ?C) (05/04 2018) ?Temp Source: Oral (05/04 2018) ?BP: 158/75 (05/04 1900) ?Pulse Rate: 73 (05/04 1900) ? ?Labs: ?Recent Labs  ?  03/20/22 ?1202 03/20/22 ?1545 03/20/22 ?2139  ?HGB 14.6  --   --   ?HCT 44.2  --   --   ?PLT 148*  --   --   ?APTT 26  --   --   ?LABPROT 13.9  --   --   ?INR 1.1  --   --   ?HEPARINUNFRC  --   --  0.57  ?CREATININE 1.13  --   --   ?TROPONINIHS 20* 18*  --   ? ? ? ?Estimated Creatinine Clearance: 57.2 mL/min (by C-G formula based on SCr of 1.13 mg/dL). ? ? ?Medical History: ?Past Medical History:  ?Diagnosis Date  ? Actinic keratosis   ? scalp  ? Arthritis   ? ASD (atrial septal defect) 06/10/2015  ? Atrial flutter, paroxysmal ([place]) 08/01/2015  ? Cancer of right kidney ([place]) 06/05/2021  ? Chronic anticoagulation 08/29/2015  ? Chronic pain of right knee 12/07/2014  ? Dysplastic nevus 01/11/2015  ? upper back spinal  ? Dysplastic nevus 01/29/2017  ? right superior calf inferior to popliteal  ? Dysrhythmia   ? AFIB  ? Goals of care, counseling/discussion 09/17/2020  ? Hypertension   ? Kidney stones   ? Obstructive sleep apnea syndrome 06/26/2015  ? PAF (paroxysmal atrial fibrillation) ([place]) 07/30/2015  ? Pericarditis   ? 1999  ? PFO (patent foramen ovale) 06/16/2018  ? Preoperative cardiovascular examination 02/03/2016  ? 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 Shortness of breath dyspnea   ? W/ EXERTION   ? Sleep apnea   ? CPAP  ? Stroke [facility])   ? 04-2015  ? ? ?Medications:  ?Scheduled:  ? [START ON 03/21/2022] amLODipine  10 mg Oral Daily  ? [START ON 03/21/2022] Chlorhexidine Gluconate Cloth   6 each Topical Daily  ? dexamethasone  4 mg Oral Daily  ? levETIRAcetam  1,500 mg Oral BID  ? phenytoin  100 mg Oral TID  ? simvastatin  20 mg Oral QHS  ? ? ?Assessment: ?71 yo male presenting with shortness of breath.  Patient had a CT scan done at PCP office this AM which showed bilateral PE with right heart strain (RV/LV ratio 1.5) with no pericardial effusion.  Patient reports he was previously taking Eliquis for history of Afib, however has been off this medication for several years and no current anticoagulation.  Pharmacy is consulted to dose heparin for PE.  ? ?Baseline labs: Hgb 14 (stable), platelets low 148 (baseline ~200s), aPTT 26 seconds, INR 1.1 ? ?1st HL 0.57, therapeutic on 1200 units/hr ?Per RN no bleeding noted ? ?Goal of Therapy:  ?Heparin level 0.3-0.7 units/ml ?Monitor platelets by anticoagulation protocol: Yes ?  ?Plan:  ?Continue heparin infusion at 1200 units/hour ?F/u heparin level in 8 hours ?Monitor daily heparin level, CBC, signs/symptoms of bleeding ?F/u ability to transition to oral anticoagulation ? ?Dolly Rias RPh ?03/20/2022, 10:24 PM ? ?

## 2022-03-20 NOTE — Consult Note (Signed)
? ?NAME:  Mathew Leach, MRN:  [id number], DOB:  1950-12-12, LOS: 0 ?ADMISSION DATE:  03/20/2022, CONSULTATION DATE:  5/4 ?REFERRING MD:  TRifan, CHIEF COMPLAINT:  PE (submassive)  ? ?History of Present Illness:  ?This is a 71 year old male patient with a history of progressive glioblastoma currently on salvage therapy just left the hospital/rehab following breakthrough seizure, going to rehab afterwards to recover from postictal state.  Shortly after leaving rehab he was out with his son The following day and noted fairly abrupt onset of shortness of breath this was followed by home health nursing over the following days documenting hypoxia, and ultimately he went to see his primary care provider on 5/3 who noted pulse oximetry 70% on room air during ambulation.  He was sent for CT imaging at [facility] radiology which showed large bilateral pulmonary emboli within RV/LV ratio of 1.5 he was sent to the emergency room following this for hospital admission.  Because of his RV/LV ratio this pulmonary emboli was considered submassive and therefore critical care was asked to evaluate ? ?Pertinent  Medical History  ?Glioblastoma (recurrent and progressive), s/p left frontal crani 2021, radiation therapy 2021, s/p chemo w/ progression of disease Feb 2023 started Temodar. 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 Now on salvage therapy.  ?Afib/flutter osteoarthritis renal cell carcinoma dysplastic nevus, HTN, renal stones, OSA on CPAP pericarditis  ?Significant Hospital Events: ?Including procedures, antibiotic start and stop dates in addition to other pertinent events   ?5/4 admitted with submassive pulmonary emboli ? ?Interim History / Subjective:  ?71 year old man currently in no acute distress ? ?Objective   ?Blood pressure (Abnormal) 141/83, pulse 73, temperature 97.6 ?F (36.4 ?C), temperature source Oral, resp. rate 17, height '5\' 8"'$  (1.727 m),  weight 75.3 kg, SpO2 93 %. ?   ?   ?No intake or output data in the 24 hours ending 03/20/22 1429 ?Filed Weights  ? 03/20/22 1114  ?Weight: 75.3 kg  ? ? ?Examination: ?General: Well-developed 71 year old male patient no acute distress on 2 L ?HENT: Normocephalic atraumatic no jugular venous distention ?Lungs: Clear diminished bases ?Cardiovascular: Regular rate and rhythm without murmur rub or gallop ?Abdomen: Soft nontender ?Extremities: No edema bilaterally pulses strong and palpable ?Neuro: Slow to respond but awake and oriented no focal deficits ? ? ?Resolved Hospital Problem list   ? ? ?Assessment & Plan:  ?Principal Problem: ?  Bilateral pulmonary embolism ([place]) ?Active Problems: ?  Obstructive sleep apnea syndrome ?  PAF (paroxysmal atrial fibrillation) ([place]) ?  Dyslipidemia ?  Hypertension ?  Frontal glioblastoma multiforme (HCC) ? ?Acute submassive pulmonary emboli with RV/LV ratio 1.5.  All per report.  Currently imaging not available ?Pesi score 112 ?Trop 20 ?Hemodynamically stable, minimal O2 need ?Not a candidate for systemic thrombolysis catheter directed thrombolysis due to progressive glioblastoma & risk for bleeding.  ?He would however be a candidate for mechanical thrombectomy at this point however he is hemodynamically stable, he requires minimal oxygen, and per my discussion w/ Dr Laural Roes via IR the clots are more distal (so although some improvement might be expected the benefits may not be as good as would be in comparison to if clot burden was more central). So I think at this point electively considering thrombectomy is not indicated. Also Jayan and his wife were not really interested in this treatment. Unfortunately if he were to rapidly deteriorate it would likely be to late to get him to [facility] for  this. Because of the nature of this uncertainty we did discuss goals of care. I recommended that we continue therapy as outlined however should his heart stop as a consequence of this CPR would  be of no benefit. He and his wife have agreed to full DNR should his status worsen ? ?Plan ?Cont iv heparin if stable after 48 hrs transition to ac of choice ?No interventional procedures ?Full DNR  ? ?Erick Colace ACNP-BC ?[place] ?Pager # [phone] OR # [phone] if no answer ? ?Best Practice (right click and "Reselect all SmartList Selections" daily)  ?Per primary ? ?Labs   ?CBC: ?Recent Labs  ?Lab 03/20/22 ?1202  ?WBC 9.0  ?NEUTROABS 6.7  ?HGB 14.6  ?HCT 44.2  ?MCV 93.4  ?PLT 148*  ? ? ?Basic Metabolic Panel: ?Recent Labs  ?Lab 03/20/22 ?1202  ?NA 144  ?K 3.9  ?CL 114*  ?CO2 24  ?GLUCOSE 104*  ?BUN 21  ?CREATININE 1.13  ?CALCIUM 8.9  ? ?GFR: ?Estimated Creatinine Clearance: 57.2 mL/min (by C-G formula based on SCr of 1.13 mg/dL). ?Recent Labs  ?Lab 03/20/22 ?1202  ?WBC 9.0  ? ? ?Liver Function Tests: ?No results for input(s): AST, ALT, ALKPHOS, BILITOT, PROT, ALBUMIN in the last 168 hours. ?No results for input(s): LIPASE, AMYLASE in the last 168 hours. ?No results for input(s): AMMONIA in the last 168 hours. ? ?ABG ?No results found for: PHART, PCO2ART, PO2ART, HCO3, TCO2, ACIDBASEDEF, O2SAT  ? ?Coagulation Profile: ?Recent Labs  ?Lab 03/20/22 ?1202  ?INR 1.1  ? ? ?Cardiac Enzymes: ?No results for input(s): CKTOTAL, CKMB, CKMBINDEX, TROPONINI in the last 168 hours. ? ?HbA1C: ?Hgb A1c MFr Bld  ?Date/Time Value Ref Range Status  ?08/13/2021 01:35 PM 5.9 (H) 4.8 - 5.6 % Final  ?  Comment:  ?  (NOTE) ?        Prediabetes: 5.7 - 6.4 ?        Diabetes: >6.4 ?        Glycemic control for adults with diabetes: <7.0 ?  ?09/13/2020 09:32 AM 5.8 (H) 4.8 - 5.6 % Final  ?  Comment:  ?  (NOTE) ?        Prediabetes: 5.7 - 6.4 ?        Diabetes: >6.4 ?        Glycemic control for adults with diabetes: <7.0 ?  ? ? ?CBG: ?No results for input(s): GLUCAP in the last 168 hours. ? ?Review of Systems:   ?See above  ? ?Past Medical History:  ?He,  has a past medical history of Actinic keratosis, Arthritis,  ASD (atrial septal defect) (06/10/2015), Atrial flutter, paroxysmal ([place]) (08/01/2015), Cancer of right kidney ([place]) (06/05/2021), Chronic anticoagulation (08/29/2015), Chronic pain of right knee (12/07/2014), Dysplastic nevus (01/11/2015), Dysplastic nevus (01/29/2017), Dysrhythmia, Goals of care, counseling/discussion (09/17/2020), Hypertension, Kidney stones, Obstructive sleep apnea syndrome (06/26/2015), PAF (paroxysmal atrial fibrillation) ([place]) (07/30/2015), Pericarditis, PFO (patent foramen ovale) (06/16/2018), Preoperative cardiovascular examination (02/03/2016), S/P TKR (total knee replacement) using cement, right (03/17/2016), Shortness of breath dyspnea, Sleep apnea, and Stroke ([place]).  ? ?Surgical History:  ? ?Past Surgical History:  ?Procedure Laterality Date  ? APPLICATION OF CRANIAL NAVIGATION Left 09/18/2020  ? Procedure: APPLICATION OF CRANIAL NAVIGATION;  Surgeon: Consuella Lose, MD;  Location: [facility];  Service: Neurosurgery;  Laterality: Left;  ? COLONOSCOPY  04/10/2011  ? Moderate predominantly sigmoid diverticulosis. Small internal hemorrhoids.   ? COLONOSCOPY  01/30/2020  ? CRANIOTOMY Left 09/18/2020  ? Procedure: LEFT FRONTAL CRANIOTOMY  FOR  TUMOR;  Surgeon: Consuella Lose, MD;  Location: [facility];  Service: Neurosurgery;  Laterality: Left;  left  ? CYST REMOVAL NECK    ? AGE 80  ? ESOPHAGOGASTRODUODENOSCOPY  04/10/2011  ? Erosive esophagitis with esophageal stricture (asymptomatic strictures since the patient not having any dysphagia). LA grade D esophagitis.  ? INGUINAL HERNIA REPAIR    ? LEFT AS CHILD  ? ROBOTIC ASSITED PARTIAL NEPHRECTOMY Right 08/28/2021  ? Procedure: XI ROBOTIC ASSISTED PARTIAL NEPHRECTOMY AND RENAL CYST DECORTICATION WITH INTRAOPERTATIVE ULTRASOUND;  Surgeon: Alexis Frock, MD;  Location: [facility];  Service: Urology;  Laterality: Right;  3 HRS  ? TOTAL KNEE ARTHROPLASTY Right 03/17/2016  ? Procedure: TOTAL KNEE ARTHROPLASTY;  Surgeon: [name], MD;  Location: [facility];  Service:  Orthopedics;  Laterality: Right;  ? watchman procedure  07/2019  ? no longer needs Eliquis  ?  ? ?Social History:  ? reports that he has never smoked. He has never used smokeless tobacco. He reports that he

## 2022-03-20 NOTE — ED Triage Notes (Signed)
Pt bib ems for SOB. Pt has ct scan this am, per pcp md, pt has bilateral Pe's w/ heart strain per ct.  Pt had scan at 0930 this am. Pt arrives to ED on 4L [place], sats high 90's.  Pt 86% on RA when trailed in ED upon arrival.  Pt placed on 2L [place] by ED RN, pt's sats 99% on 2L [place] ?

## 2022-03-20 NOTE — Progress Notes (Signed)
Pt refused cpap tonight, prefers nasal cannula.  Pt was advised that RT is available all night should he change his mind.  RN aware. ?

## 2022-03-20 NOTE — H&P (Signed)
?History and Physical  ? ? ?Patient: Mathew Leach AJG:[id number] DOB: [birth year] ?DOA: 03/20/2022 ?DOS: the patient was seen and examined on 03/20/2022 ?PCP: Maryella Shivers, MD  ?Patient coming from: Home ? ?Chief Complaint:  ?Chief Complaint  ?Patient presents with  ? Shortness of Breath  ? ?HPI: Mathew Leach is a 71 y.o. male with medical history significant of scalp actinic keratosis, osteoarthritis, atrial septal defect, paroxysmal atrial flutter/atrial fibrillation, right renal cell carcinoma, dysplastic nevus, hypertension, urolithiasis, obstructive sleep apnea on CPAP, paroxysmal atrial fibrillation, pericarditis, overweight who is coming to the emergency department due to progressively worse dyspnea and fatigue that began suddenly after he had lunch on Friday afternoon.  No fever, chills, rhinorrhea, sore throat, wheezing, hemoptysis, chest pain, palpitations, diaphoresis, PND, orthopnea or pitting edema of the lower extremities.  No abdominal pain, diarrhea, constipation, melena or hematochezia.  No flank pain, dysuria, frequency or hematuria.  No polyuria, polydipsia, polyphagia or blurred vision. ? ?ED course: Initial vital signs were temperature 97.5 ?F, pulse 76, respiration 18, BP 150/80 mmHg O2 sat 86% on room air. ? ?Lab work: CBC showed white count of 9.0 with 74% neutrophils, hemoglobin 14.6 g/dL platelets 148.  PT and INR were normal.  Troponin was 20 ng/L, BNP 143.3 pg/mL.  BMP showed a chloride of 114 mmol/L and glucose of 104 mg/dL. ? ?Imaging: Outside facility CTA chest showing bilateral pulmonary embolism with an RV to LV ratio of 1.5. ? ?Review of Systems: As mentioned in the history of present illness. All other systems reviewed and are negative. ?Past Medical History:  ?Diagnosis Date  ? Actinic keratosis   ? scalp  ? Arthritis   ? ASD (atrial septal defect) 06/10/2015  ? Atrial flutter, paroxysmal ([place]) 08/01/2015  ? Cancer of right kidney ([place]) 06/05/2021  ? Chronic  anticoagulation 08/29/2015  ? Chronic pain of right knee 12/07/2014  ? Dysplastic nevus 01/11/2015  ? upper back spinal  ? Dysplastic nevus 01/29/2017  ? right superior calf inferior to popliteal  ? Dysrhythmia   ? AFIB  ? Goals of care, counseling/discussion 09/17/2020  ? Hypertension   ? Kidney stones   ? Obstructive sleep apnea syndrome 06/26/2015  ? PAF (paroxysmal atrial fibrillation) ([place]) 07/30/2015  ? Pericarditis   ? 1999  ? PFO (patent foramen ovale) 06/16/2018  ? Preoperative cardiovascular examination 02/03/2016  ? 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 Shortness of breath dyspnea   ? W/ EXERTION   ? Sleep apnea   ? CPAP  ? Stroke [facility])   ? 04-2015  ? ?Past Surgical History:  ?Procedure Laterality Date  ? APPLICATION OF CRANIAL NAVIGATION Left 09/18/2020  ? Procedure: APPLICATION OF CRANIAL NAVIGATION;  Surgeon: Consuella Lose, MD;  Location: [place];  Service: Neurosurgery;  Laterality: Left;  ? COLONOSCOPY  04/10/2011  ? Moderate predominantly sigmoid diverticulosis. Small internal hemorrhoids.   ? COLONOSCOPY  01/30/2020  ? CRANIOTOMY Left 09/18/2020  ? Procedure: LEFT FRONTAL CRANIOTOMY FOR  TUMOR;  Surgeon: Consuella Lose, MD;  Location: [place];  Service: Neurosurgery;  Laterality: Left;  left  ? CYST REMOVAL NECK    ? AGE 86  ? ESOPHAGOGASTRODUODENOSCOPY  04/10/2011  ? Erosive esophagitis with esophageal stricture (asymptomatic strictures since the patient not having any dysphagia). LA grade D esophagitis.  ? INGUINAL HERNIA REPAIR    ? LEFT AS CHILD  ? ROBOTIC ASSITED PARTIAL NEPHRECTOMY Right 08/28/2021  ? Procedure: XI ROBOTIC ASSISTED PARTIAL NEPHRECTOMY AND RENAL CYST DECORTICATION  WITH INTRAOPERTATIVE ULTRASOUND;  Surgeon: Alexis Frock, MD;  Location[facility];  Service: Urology;  Laterality: Right;  3 HRS  ? TOTAL KNEE ARTHROPLASTY Right 03/17/2016  ? Procedure: TOTAL KNEE ARTHROPLASTY;  Surgeon: [name], MD;  Location: [place];  Service: Orthopedics;  Laterality:  Right;  ? watchman procedure  07/2019  ? no longer needs Eliquis  ? ?Social History:  reports that he has never smoked. He has never used smokeless tobacco. He reports that he does not drink alcohol and does not use drugs. ? ?Allergies  ?Allergen Reactions  ? Lipitor [Atorvastatin] Other (See Comments)  ?  Caused DOUBLE VISION ?  ? ? ?Family History  ?Problem Relation Age of Onset  ? Lung cancer Father   ? Colon cancer Neg Hx   ? Esophageal cancer Neg Hx   ? Colon polyps Neg Hx   ? Rectal cancer Neg Hx   ? Stomach cancer Neg Hx   ? ? ?Prior to Admission medications   ?Medication Sig Start Date End Date Taking? Authorizing Provider  ?amLODipine (NORVASC) 10 MG tablet Take 10 mg by mouth daily. 07/02/21  Yes [provider]  ?dexamethasone (DECADRON) 4 MG tablet Take 4 mg by mouth daily.   Yes [provider]  ?levETIRAcetam (KEPPRA) 750 MG tablet Take 2 tablets (1,500 mg total) by mouth 2 (two) times daily. 01/01/22  Yes Davonna Belling, MD  ?lomustine (GLEOSTINE) 100 MG capsule Take 200 mg by mouth as directed. Take on an emtpy stomach 1 hour before or 2 hours after meals. Caution:chemotherapy every 6 weeks 03/17/22  Yes Vaslow, Acey Lav, MD  ?loperamide (IMODIUM) 2 MG capsule Take 2 mg by mouth daily as needed for diarrhea or loose stools.   Yes [provider]  ?LORazepam (ATIVAN) 1 MG tablet Take 1 tablet (1 mg total) by mouth every 8 (eight) hours as needed for seizure. 03/18/22  Yes Vaslow, Acey Lav, MD  ?ondansetron (ZOFRAN) 8 MG tablet Take 1 tablet (8 mg total) by mouth 2 (two) times daily as needed for nausea or vomiting. 03/13/22  Yes Vaslow, Acey Lav, MD  ?phenytoin (DILANTIN) 100 MG ER capsule Take 100 mg by mouth 3 (three) times daily.   Yes [provider]  ?simvastatin (ZOCOR) 20 MG tablet Take 1 tablet (20 mg total) by mouth daily. ?Patient taking differently: Take 20 mg by mouth at bedtime. 07/16/20  Yes [name], MD  ? ? ?Physical Exam: ?Vitals:  ? 03/20/22  1330 03/20/22 1345 03/20/22 1400 03/20/22 1415  ?BP: (!) 156/77 (!) 151/84 137/90 (!) 141/83  ?Pulse: 72 73 74 73  ?Resp: 19 (!) '21 17 17  '$ ?Temp:      ?TempSrc:      ?SpO2: 96% 97% 95% 93%  ?Weight:      ?Height:      ? ?Physical Exam ?Vitals and nursing note reviewed.  ?Constitutional:   ?   Appearance: He is normal weight.  ?HENT:  ?   Head: Normocephalic.  ?   Mouth/Throat:  ?   Mouth: Mucous membranes are moist.  ?Eyes:  ?   General: No scleral icterus. ?   Pupils: Pupils are equal, round, and reactive to light.  ?Neck:  ?   Vascular: No JVD.  ?Cardiovascular:  ?   Rate and Rhythm: Normal rate and regular rhythm.  ?   Heart sounds: S1 normal and S2 normal.  ?Pulmonary:  ?   Effort: Pulmonary effort is normal.  ?  Breath sounds: Normal breath sounds. No decreased breath sounds, wheezing, rhonchi or rales.  ?Abdominal:  ?   General: Bowel sounds are normal. There is no distension.  ?   Palpations: Abdomen is soft.  ?   Tenderness: There is no abdominal tenderness.  ?Musculoskeletal:  ?   Cervical back: Neck supple.  ?   Right lower leg: No edema.  ?   Left lower leg: No edema.  ?Skin: ?   General: Skin is warm and dry.  ?Neurological:  ?   General: No focal deficit present.  ?   Mental Status: He is alert and oriented to person, place, and time.  ?Psychiatric:     ?   Mood and Affect: Mood normal. Mood is not anxious.     ?   Behavior: Behavior normal. Behavior is not agitated.  ? ?Data Reviewed: ? ?There are no new results to review at this time. ? ?Echocardiogram 03/20/2022 ?IMPRESSIONS: ? ? 1. Left ventricular ejection fraction, by estimation, is 60 to 65%. The  ?left ventricle has normal function. The left ventricle has no regional  ?wall motion abnormalities. There is mild left ventricular hypertrophy.  ?Left ventricular diastolic parameters  ?are consistent with Grade I diastolic dysfunction (impaired relaxation).  ? 2. Right ventricular systolic function is mildly reduced. The right  ?ventricular size is  normal. Tricuspid regurgitation signal is inadequate  ?for assessing PA pressure.  ? 3. The mitral valve is normal in structure. No evidence of mitral valve  ?regurgitation.  ? 4. The aortic valve is grossly normal.

## 2022-03-21 ENCOUNTER — Inpatient Hospital Stay (HOSPITAL_COMMUNITY): Payer: Medicare Other

## 2022-03-21 DIAGNOSIS — E785 Hyperlipidemia, unspecified: Secondary | ICD-10-CM

## 2022-03-21 DIAGNOSIS — I2699 Other pulmonary embolism without acute cor pulmonale: Secondary | ICD-10-CM

## 2022-03-21 DIAGNOSIS — G4733 Obstructive sleep apnea (adult) (pediatric): Secondary | ICD-10-CM

## 2022-03-21 DIAGNOSIS — C711 Malignant neoplasm of frontal lobe: Secondary | ICD-10-CM

## 2022-03-21 DIAGNOSIS — I2609 Other pulmonary embolism with acute cor pulmonale: Secondary | ICD-10-CM | POA: Diagnosis not present

## 2022-03-21 DIAGNOSIS — I10 Essential (primary) hypertension: Secondary | ICD-10-CM

## 2022-03-21 LAB — LACTIC ACID, PLASMA: Lactic Acid, Venous: 1 mmol/L (ref 0.5–1.9)

## 2022-03-21 LAB — HEPARIN LEVEL (UNFRACTIONATED)
Heparin Unfractionated: 0.62 IU/mL (ref 0.30–0.70)
Heparin Unfractionated: <0.1 IU/mL — ABNORMAL LOW (ref 0.30–0.70)
Heparin Unfractionated: <0.1 IU/mL — ABNORMAL LOW (ref 0.30–0.70)
Heparin Unfractionated: 0.78 IU/mL — ABNORMAL HIGH (ref 0.30–0.70)

## 2022-03-21 LAB — COMPREHENSIVE METABOLIC PANEL
ALT: 60 U/L — ABNORMAL HIGH (ref 0–44)
AST: 40 U/L (ref 15–41)
Albumin: 3.5 g/dL (ref 3.5–5.0)
Alkaline Phosphatase: 137 U/L — ABNORMAL HIGH (ref 38–126)
Anion gap: 8 (ref 5–15)
BUN: 22 mg/dL (ref 8–23)
CO2: 22 mmol/L (ref 22–32)
Calcium: 8.9 mg/dL (ref 8.9–10.3)
Chloride: 112 mmol/L — ABNORMAL HIGH (ref 98–111)
Creatinine, Ser: 1.36 mg/dL — ABNORMAL HIGH (ref 0.61–1.24)
GFR, Estimated: 55 mL/min — ABNORMAL LOW (ref >60)
Glucose, Bld: 109 mg/dL — ABNORMAL HIGH (ref 70–99)
Potassium: 4.2 mmol/L (ref 3.5–5.1)
Sodium: 142 mmol/L (ref 135–145)
Total Bilirubin: 0.9 mg/dL (ref 0.3–1.2)
Total Protein: 6.6 g/dL (ref 6.5–8.1)

## 2022-03-21 LAB — CBC
HCT: 43 % (ref 39.0–52.0)
Hemoglobin: 14.4 g/dL (ref 13.0–17.0)
MCH: 31.1 pg (ref 26.0–34.0)
MCHC: 33.5 g/dL (ref 30.0–36.0)
MCV: 92.9 fL (ref 80.0–100.0)
Platelets: 154 10*3/uL (ref 150–400)
RBC: 4.63 MIL/uL (ref 4.22–5.81)
RDW: 13.5 % (ref 11.5–15.5)
WBC: 8.1 10*3/uL (ref 4.0–10.5)
nRBC: 0 % (ref 0.0–0.2)

## 2022-03-21 LAB — TROPONIN I (HIGH SENSITIVITY): Troponin I (High Sensitivity): 12 ng/L (ref <18)

## 2022-03-21 LAB — BRAIN NATRIURETIC PEPTIDE: B Natriuretic Peptide: 116.8 pg/mL — ABNORMAL HIGH (ref 0.0–100.0)

## 2022-03-21 NOTE — Progress Notes (Signed)
?      ? ? ? [facility] ?                                                                           ? ? ?Mathew Leach, is a 71 y.o. male, DOB - 12-01-1950, HTD:[id number] ?Admit date - 03/20/2022    ?Outpatient Primary MD for the patient is Maryella Shivers, MD ? ?LOS - 1  days ? ?Chief Complaint  ?Patient presents with  ? Shortness of Breath  ?    ? ?Brief summary  ? ?Patient is a 71 year old male with osteoarthritis, ASD, paroxysmal A-fib, right renal cell carcinoma, hypertension, history of glioblastoma, OSA on CPAP presented to ED with progressively worsening dyspnea and fatigue that began suddenly after he had lunch on Friday afternoon last week.  No fevers, chills, wheezing, hemoptysis or chest pain, palpitations. ?In ED O2 sats 86% on room air ?Outside facility CTA chest showed bilateral pulmonary embolism with RV to LV ratio of 1.5 ?Patient was admitted for bilateral pulmonary embolism with right heart strain. ? ? ?Assessment & Plan  ? ? ?Principal Problem: ?  Bilateral pulmonary embolism ([place]), with acute respiratory failure, hypoxia ?-Possibly due to underlying malignancy.  CTA showed large bilateral PE with RV/LV ratio 1.5 ?-Pulmonology was consulted.  Not a candidate for systemic thrombolysis due to progressive glioblastoma and risk for bleeding.  Electively considering thrombectomy is not indicated and patient/family also declined.  Goals of care were addressed, DNR. ?-Recommended continue IV heparin.  If stable, after 48 hours, can transition to DOAC ?-Venous Dopplers lower extremity negative for DVT ?- 2D echo showed EF of 60 to 65%, G1 DD, mildly reduced RV function. ? ?Active Problems: ?  Obstructive sleep apnea syndrome ?-Continue CPAP qhs ? ?  PAF (paroxysmal atrial fibrillation) ([place]) ?-Rate controlled. ?-Previously used to be on Eliquis, currently on IV heparin drip ?-Will place back on Eliquis after 48 hours ? ?  Dyslipidemia ?-Continue simvastatin ? ?  Hypertension ?-Continue  amlodipine ? ?History of seizure ([place]) ?-Currently stable, continue Keppra, phenytoin ? ?  Frontal glioblastoma multiforme (HCC) ?-Continue dexamethasone 4 mg p.o. daily, on Gleostine every 6 weeks ?-Outpatient follow-up with Dr. Mickeal Skinner. ? ?Code Status: DNR ?DVT Prophylaxis:    On IV heparin drip ? ? ?Level of Care: Level of care: Stepdown ?Family Communication: Updated patient ? ?Disposition Plan:      Remains inpatient appropriate: Continue IV heparin drip for 48 hours, then will transition to oral eliquis ? ? ?Procedures:  ?None ? ?Consultants:   ?Pulmonary critical care ? ?Antimicrobials: None ? ? ? ?Medications ? amLODipine  10 mg Oral Daily  ? Chlorhexidine Gluconate Cloth  6 each Topical Daily  ? dexamethasone  4 mg Oral Daily  ? levETIRAcetam  1,500 mg Oral BID  ? mouth rinse  15 mL Mouth Rinse BID  ? phenytoin  100 mg Oral TID  ? simvastatin  20 mg Oral QHS  ? ? ? ? ?Subjective:  ? ?Mathew Leach was seen and examined today.  Currently no acute complaints, still feels somewhat short of breath but no chest pain, nausea vomiting abdominal pain or diarrhea.  No fevers ? ?Objective:  ? ?Vitals:  ? 03/21/22  0900 03/21/22 1000 03/21/22 1100 03/21/22 1123  ?BP: (!) 142/77 (!) 126/93 (!) 151/74   ?Pulse: 70 71 69   ?Resp: 19 19 (!) 26   ?Temp:    97.7 ?F (36.5 ?C)  ?TempSrc:    Oral  ?SpO2: 95% (!) 88% 96%   ?Weight:      ?Height:      ? ? ?Intake/Output Summary (Last 24 hours) at 03/21/2022 1228 ?Last data filed at 03/21/2022 9604 ?Gross per 24 hour  ?Intake 501.13 ml  ?Output 700 ml  ?Net -198.87 ml  ? ? ? ?Wt Readings from Last 3 Encounters:  ?03/20/22 70.9 kg  ?03/13/22 75.6 kg  ?01/28/22 76.2 kg  ? ? ? ?Exam ?General: Alert and oriented x 3, NAD ?Cardiovascular: S1 S2 auscultated,  RRR ?Respiratory: Diminished BS at the bases, otherwise CTA B ?Gastrointestinal: Soft, nontender, nondistended, + bowel sounds ?Ext: no pedal edema bilaterally ?Neuro: Strength 5/5 upper and lower extremities bilaterally ?Psych: Normal  affect and demeanor, alert and oriented x3  ? ? ? ?Data Reviewed:  I have personally reviewed following labs  ? ? ?CBC ?Lab Results  ?Component Value Date  ? WBC 8.1 03/21/2022  ? RBC 4.63 03/21/2022  ? HGB 14.4 03/21/2022  ? HCT 43.0 03/21/2022  ? MCV 92.9 03/21/2022  ? MCH 31.1 03/21/2022  ? PLT 154 03/21/2022  ? MCHC 33.5 03/21/2022  ? RDW 13.5 03/21/2022  ? LYMPHSABS 1.6 03/20/2022  ? MONOABS 0.5 03/20/2022  ? EOSABS 0.1 03/20/2022  ? BASOSABS 0.1 03/20/2022  ? ? ? ?Last metabolic panel ?Lab Results  ?Component Value Date  ? NA 142 03/21/2022  ? K 4.2 03/21/2022  ? CL 112 (H) 03/21/2022  ? CO2 22 03/21/2022  ? BUN 22 03/21/2022  ? CREATININE 1.36 (H) 03/21/2022  ? GLUCOSE 109 (H) 03/21/2022  ? GFRNONAA 55 (L) 03/21/2022  ? GFRAA DUPLICATE 54/07/8118  ? CALCIUM 8.9 03/21/2022  ? PROT 6.6 03/21/2022  ? ALBUMIN 3.5 03/21/2022  ? LABGLOB 2.4 02/13/2020  ? AGRATIO 1.8 02/13/2020  ? BILITOT 0.9 03/21/2022  ? ALKPHOS 137 (H) 03/21/2022  ? AST 40 03/21/2022  ? ALT 60 (H) 03/21/2022  ? ANIONGAP 8 03/21/2022  ? ? ?CBG (last 3)  ?No results for input(s): GLUCAP in the last 72 hours.  ? ? ?Coagulation Profile: ?Recent Labs  ?Lab 03/20/22 ?1202  ?INR 1.1  ? ? ? ?Radiology Studies: I have personally reviewed the imaging studies  ?ECHOCARDIOGRAM COMPLETE ? ?Result Date: 03/20/2022 ? IMPRESSIONS  1. Left ventricular ejection fraction, by estimation, is 60 to 65%. The left ventricle has normal function. The left ventricle has no regional wall motion abnormalities. There is mild left ventricular hypertrophy. Left ventricular diastolic parameters are consistent with Grade I diastolic dysfunction (impaired relaxation).  2. Right ventricular systolic function is mildly reduced. The right ventricular size is normal. Tricuspid regurgitation signal is inadequate for assessing PA pressure.  3. The mitral valve is normal in structure. No evidence of mitral valve regurgitation.  4. The aortic valve is grossly normal. There is mild  calcification of the aortic valve. Aortic valve regurgitation is not visualized.  5. The inferior vena cava is normal in size with <50% respiratory variability, suggesting right atrial pressure of 8 mmHg. Comparison(s): No prior Echocardiogram.  ?VAS [place] LOWER EXTREMITY VENOUS (DVT) ? ?Result Date: 03/21/2022 ? Lower Venous DVT Study    Summary: BILATERAL: - No evidence of deep vein thrombosis seen in the lower extremities, bilaterally. - No evidence  of superficial venous thrombosis in the lower extremities, bilaterally. - RIGHT: - A cystic structure is found in the popliteal fossa.  LEFT: - No cystic structure found in the popliteal fossa.  *See table(s) above for measurements and observations.    Preliminary    ? ? ? ? ?Estill Cotta M.D. ?[facility] ?03/21/2022, 12:28 PM ? ?Available via Epic secure chat 7am-7pm ?After 7 pm, please refer to night coverage provider listed on amion. ? ? ? ?

## 2022-03-21 NOTE — Progress Notes (Signed)
ANTICOAGULATION CONSULT NOTE - follow up ? ?Pharmacy Consult for heparin ?Indication: pulmonary embolus ? ?Allergies  ?Allergen Reactions  ? Lipitor [Atorvastatin] Other (See Comments)  ?  Caused DOUBLE VISION ?  ? ? ?Patient Measurements: ?Height: '5\' 8"'$  (172.7 cm) ?Weight: 70.9 kg (156 lb 4.9 oz) ?IBW/kg (Calculated) : 68.4 ?Heparin Dosing Weight: 75.3 kg ? ?Vital Signs: ?Temp: 97.4 ?F (36.3 ?C) (05/05 2000) ?Temp Source: Oral (05/05 2000) ?BP: 157/82 (05/05 2000) ?Pulse Rate: 82 (05/05 2000) ? ?Labs: ?Recent Labs  ?  03/20/22 ?1202 03/20/22 ?1545 03/20/22 ?2139 03/21/22 ?0239 03/21/22 ?6283 03/21/22 ?1135 03/21/22 ?1307 03/21/22 ?2142  ?HGB 14.6  --   --  14.4  --   --   --   --   ?HCT 44.2  --   --  43.0  --   --   --   --   ?PLT 148*  --   --  154  --   --   --   --   ?APTT 26  --   --   --   --   --   --   --   ?LABPROT 13.9  --   --   --   --   --   --   --   ?INR 1.1  --   --   --   --   --   --   --   ?HEPARINUNFRC  --   --    < > 0.62  --  <0.10* <0.10* 0.78*  ?CREATININE 1.13  --   --  1.36*  --   --   --   --   ?TROPONINIHS 20* 18*  --   --  12  --   --   --   ? < > = values in this interval not displayed.  ? ? ? ?Estimated Creatinine Clearance: 47.5 mL/min (A) (by C-G formula based on SCr of 1.36 mg/dL (H)). ? ? ?Medical History: ?Past Medical History:  ?Diagnosis Date  ? Actinic keratosis   ? scalp  ? Arthritis   ? ASD (atrial septal defect) 06/10/2015  ? Atrial flutter, paroxysmal ([place]) 08/01/2015  ? Cancer of right kidney ([place]) 06/05/2021  ? Chronic anticoagulation 08/29/2015  ? Chronic pain of right knee 12/07/2014  ? Dysplastic nevus 01/11/2015  ? upper back spinal  ? Dysplastic nevus 01/29/2017  ? right superior calf inferior to popliteal  ? Dysrhythmia   ? AFIB  ? Goals of care, counseling/discussion 09/17/2020  ? Hypertension   ? Kidney stones   ? Obstructive sleep apnea syndrome 06/26/2015  ? PAF (paroxysmal atrial fibrillation) ([place]) 07/30/2015  ? Pericarditis   ? 1999  ? PFO (patent foramen ovale)  06/16/2018  ? Preoperative cardiovascular examination 02/03/2016  ? 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 Shortness of breath dyspnea   ? W/ EXERTION   ? Sleep apnea   ? CPAP  ? Stroke [facility])   ? 04-2015  ? ? ?Medications:  ?Scheduled:  ? amLODipine  10 mg Oral Daily  ? Chlorhexidine Gluconate Cloth  6 each Topical Daily  ? dexamethasone  4 mg Oral Daily  ? levETIRAcetam  1,500 mg Oral BID  ? mouth rinse  15 mL Mouth Rinse BID  ? phenytoin  100 mg Oral TID  ? simvastatin  20 mg Oral QHS  ? ? ?Assessment: ?71 yo male presenting with shortness of breath.  Patient had a CT scan done at  PCP office this AM which showed bilateral PE with right heart strain (RV/LV ratio 1.5) with no pericardial effusion.  Patient reports he was previously taking Eliquis for history of Afib, however has been off this medication for several years and no current anticoagulation.  Pharmacy is consulted to dose heparin for PE.  ? ?Baseline labs: Hgb 14.4 (stable), platelets low 154 (stable, baseline ~200s), aPTT 26 seconds, INR 1.1 ? ?1st HL 0.57, therapeutic on 1200 units/hr ?2nd HL 0.62, therapeutic on 1200 units/hr-obtained 5 hours after 1st level ?5/5 1100 3rd & 5/5 1400 4th HL <0.1, subtherapeutic on 1200 units/hr ? - 1235 no blood return but flushes when nurse checked IV site on left arm by nurse ? - IV heparin site moved to right arm at 1400 today ? ?-2142 HL 0.78 slightly supra-therapeutic ?Per RN no bleeding and  no line interruption ? ?Goal of Therapy:  ?Heparin level 0.3-0.7 units/ml ?Monitor platelets by anticoagulation protocol: Yes ?  ?Plan:  ?Decrease heparin infusion to 1100 units/hr ?Monitor daily heparin level, CBC, signs/symptoms of bleeding ?F/u ability to transition to oral anticoagulation ? ?Dolly Rias RPh ?03/21/2022, 10:28 PM ? ? ?

## 2022-03-21 NOTE — Progress Notes (Signed)
BLE venous duplex has been completed. ? ? ?Results can be found under chart review under CV PROC. ?03/21/2022 11:36 AM ?Hazel Leveille RVT, RDMS ? ?

## 2022-03-21 NOTE — Progress Notes (Signed)
Pt refused cpap

## 2022-03-21 NOTE — Progress Notes (Signed)
ANTICOAGULATION CONSULT NOTE - follow up ? ?Pharmacy Consult for heparin ?Indication: pulmonary embolus ? ?Allergies  ?Allergen Reactions  ? Lipitor [Atorvastatin] Other (See Comments)  ?  Caused DOUBLE VISION ?  ? ? ?Patient Measurements: ?Height: '5\' 8"'$  (172.7 cm) ?Weight: 70.9 kg (156 lb 4.9 oz) ?IBW/kg (Calculated) : 68.4 ?Heparin Dosing Weight: 75.3 kg ? ?Vital Signs: ?Temp: 97.7 ?F (36.5 ?C) (05/05 1123) ?Temp Source: Oral (05/05 1123) ?BP: 132/67 (05/05 1300) ?Pulse Rate: 75 (05/05 1300) ? ?Labs: ?Recent Labs  ?  03/20/22 ?1202 03/20/22 ?1545 03/20/22 ?2139 03/21/22 ?0239 03/21/22 ?6834 03/21/22 ?1135 03/21/22 ?1307  ?HGB 14.6  --   --  14.4  --   --   --   ?HCT 44.2  --   --  43.0  --   --   --   ?PLT 148*  --   --  154  --   --   --   ?APTT 26  --   --   --   --   --   --   ?LABPROT 13.9  --   --   --   --   --   --   ?INR 1.1  --   --   --   --   --   --   ?HEPARINUNFRC  --   --    < > 0.62  --  <0.10* <0.10*  ?CREATININE 1.13  --   --  1.36*  --   --   --   ?TROPONINIHS 20* 18*  --   --  12  --   --   ? < > = values in this interval not displayed.  ? ? ? ?Estimated Creatinine Clearance: 47.5 mL/min (A) (by C-G formula based on SCr of 1.36 mg/dL (H)). ? ? ?Medical History: ?Past Medical History:  ?Diagnosis Date  ? Actinic keratosis   ? scalp  ? Arthritis   ? ASD (atrial septal defect) 06/10/2015  ? Atrial flutter, paroxysmal ([place]) 08/01/2015  ? Cancer of right kidney ([place]) 06/05/2021  ? Chronic anticoagulation 08/29/2015  ? Chronic pain of right knee 12/07/2014  ? Dysplastic nevus 01/11/2015  ? upper back spinal  ? Dysplastic nevus 01/29/2017  ? right superior calf inferior to popliteal  ? Dysrhythmia   ? AFIB  ? Goals of care, counseling/discussion 09/17/2020  ? Hypertension   ? Kidney stones   ? Obstructive sleep apnea syndrome 06/26/2015  ? PAF (paroxysmal atrial fibrillation) ([place]) 07/30/2015  ? Pericarditis   ? 1999  ? PFO (patent foramen ovale) 06/16/2018  ? Preoperative cardiovascular examination 02/03/2016   ? 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 Shortness of breath dyspnea   ? W/ EXERTION   ? Sleep apnea   ? CPAP  ? Stroke [facility])   ? 04-2015  ? ? ?Medications:  ?Scheduled:  ? amLODipine  10 mg Oral Daily  ? Chlorhexidine Gluconate Cloth  6 each Topical Daily  ? dexamethasone  4 mg Oral Daily  ? levETIRAcetam  1,500 mg Oral BID  ? mouth rinse  15 mL Mouth Rinse BID  ? phenytoin  100 mg Oral TID  ? simvastatin  20 mg Oral QHS  ? ? ?Assessment: ?71 yo male presenting with shortness of breath.  Patient had a CT scan done at PCP office this AM which showed bilateral PE with right heart strain (RV/LV ratio 1.5) with no pericardial effusion.  Patient reports he was previously taking Eliquis  for history of Afib, however has been off this medication for several years and no current anticoagulation.  Pharmacy is consulted to dose heparin for PE.  ? ?Baseline labs: Hgb 14.4 (stable), platelets low 154 (stable, baseline ~200s), aPTT 26 seconds, INR 1.1 ? ?1st HL 0.57, therapeutic on 1200 units/hr ?2nd HL 0.62, therapeutic on 1200 units/hr-obtained 5 hours after 1st level ?5/5 1100 3rd & 5/5 1400 4th HL <0.1, subtherapeutic on 1200 units/hr ? - 1235 no blood return but flushes when nurse checked IV site on left arm by nurse ? - IV heparin site moved to right arm at 1400 today ? ?Per RN no bleeding and  no line interruption ? ?Goal of Therapy:  ?Heparin level 0.3-0.7 units/ml ?Monitor platelets by anticoagulation protocol: Yes ?  ?Plan:  ?Continue heparin infusion at 1200 units/hour ?F/u repeat heparin level in 8 hours after IV site change ?Monitor daily heparin level, CBC, signs/symptoms of bleeding ?F/u ability to transition to oral anticoagulation ? ?Royetta Asal, PharmD, BCPS ?Clinical Pharmacist ?[place] ?Please utilize Amion for appropriate phone number to reach the unit pharmacist ([place]) ?03/21/2022 2:04 PM ? ? ?

## 2022-03-21 NOTE — Progress Notes (Signed)
ANTICOAGULATION CONSULT NOTE - follow up ? ?Pharmacy Consult for heparin ?Indication: pulmonary embolus ? ?Allergies  ?Allergen Reactions  ? Lipitor [Atorvastatin] Other (See Comments)  ?  Caused DOUBLE VISION ?  ? ? ?Patient Measurements: ?Height: '5\' 8"'$  (172.7 cm) ?Weight: 70.9 kg (156 lb 4.9 oz) ?IBW/kg (Calculated) : 68.4 ?Heparin Dosing Weight: 75.3 kg ? ?Vital Signs: ?Temp: 98 ?F (36.7 ?C) (05/05 0400) ?Temp Source: Axillary (05/05 0400) ?BP: 159/79 (05/05 0700) ?Pulse Rate: 68 (05/05 0700) ? ?Labs: ?Recent Labs  ?  03/20/22 ?1202 03/20/22 ?1545 03/20/22 ?2139 03/21/22 ?0239 03/21/22 ?6270  ?HGB 14.6  --   --  14.4  --   ?HCT 44.2  --   --  43.0  --   ?PLT 148*  --   --  154  --   ?APTT 26  --   --   --   --   ?LABPROT 13.9  --   --   --   --   ?INR 1.1  --   --   --   --   ?HEPARINUNFRC  --   --  0.57 0.62  --   ?CREATININE 1.13  --   --  1.36*  --   ?TROPONINIHS 20* 18*  --   --  12  ? ? ? ?Estimated Creatinine Clearance: 47.5 mL/min (A) (by C-G formula based on SCr of 1.36 mg/dL (H)). ? ? ?Medical History: ?Past Medical History:  ?Diagnosis Date  ? Actinic keratosis   ? scalp  ? Arthritis   ? ASD (atrial septal defect) 06/10/2015  ? Atrial flutter, paroxysmal ([place]) 08/01/2015  ? Cancer of right kidney ([place]) 06/05/2021  ? Chronic anticoagulation 08/29/2015  ? Chronic pain of right knee 12/07/2014  ? Dysplastic nevus 01/11/2015  ? upper back spinal  ? Dysplastic nevus 01/29/2017  ? right superior calf inferior to popliteal  ? Dysrhythmia   ? AFIB  ? Goals of care, counseling/discussion 09/17/2020  ? Hypertension   ? Kidney stones   ? Obstructive sleep apnea syndrome 06/26/2015  ? PAF (paroxysmal atrial fibrillation) ([place]) 07/30/2015  ? Pericarditis   ? 1999  ? PFO (patent foramen ovale) 06/16/2018  ? Preoperative cardiovascular examination 02/03/2016  ? 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 Shortness of breath dyspnea   ? W/ EXERTION   ? Sleep apnea   ? CPAP  ? Stroke [facility])   ? 04-2015   ? ? ?Medications:  ?Scheduled:  ? amLODipine  10 mg Oral Daily  ? Chlorhexidine Gluconate Cloth  6 each Topical Daily  ? dexamethasone  4 mg Oral Daily  ? levETIRAcetam  1,500 mg Oral BID  ? mouth rinse  15 mL Mouth Rinse BID  ? phenytoin  100 mg Oral TID  ? simvastatin  20 mg Oral QHS  ? ? ?Assessment: ?71 yo male presenting with shortness of breath.  Patient had a CT scan done at PCP office this AM which showed bilateral PE with right heart strain (RV/LV ratio 1.5) with no pericardial effusion.  Patient reports he was previously taking Eliquis for history of Afib, however has been off this medication for several years and no current anticoagulation.  Pharmacy is consulted to dose heparin for PE.  ? ?Baseline labs: Hgb 14.4 (stable), platelets low 154 (stable, baseline ~200s), aPTT 26 seconds, INR 1.1 ? ?1st HL 0.57, therapeutic on 1200 units/hr ?2nd HL 0.62, therapeutic on 1200 units/hr-obtained 5 hours after 1st level ?Per RN no bleeding  noted ? ?Goal of Therapy:  ?Heparin level 0.3-0.7 units/ml ?Monitor platelets by anticoagulation protocol: Yes ?  ?Plan:  ?Continue heparin infusion at 1200 units/hour ?F/u repeat confirmatory heparin level in 8 hours ?Monitor daily heparin level, CBC, signs/symptoms of bleeding ?F/u ability to transition to oral anticoagulation ? ?Royetta Asal, PharmD, BCPS ?Clinical Pharmacist ?[place] ?Please utilize Amion for appropriate phone number to reach the unit pharmacist ([place]) ?03/21/2022 7:31 AM ? ? ?

## 2022-03-22 DIAGNOSIS — E785 Hyperlipidemia, unspecified: Secondary | ICD-10-CM | POA: Diagnosis not present

## 2022-03-22 DIAGNOSIS — C711 Malignant neoplasm of frontal lobe: Secondary | ICD-10-CM | POA: Diagnosis not present

## 2022-03-22 DIAGNOSIS — I2699 Other pulmonary embolism without acute cor pulmonale: Secondary | ICD-10-CM | POA: Diagnosis not present

## 2022-03-22 DIAGNOSIS — I10 Essential (primary) hypertension: Secondary | ICD-10-CM | POA: Diagnosis not present

## 2022-03-22 LAB — CBC
HCT: 41.4 % (ref 39.0–52.0)
Hemoglobin: 14.2 g/dL (ref 13.0–17.0)
MCH: 32.1 pg (ref 26.0–34.0)
MCHC: 34.3 g/dL (ref 30.0–36.0)
MCV: 93.5 fL (ref 80.0–100.0)
Platelets: 143 10*3/uL — ABNORMAL LOW (ref 150–400)
RBC: 4.43 MIL/uL (ref 4.22–5.81)
RDW: 13.7 % (ref 11.5–15.5)
WBC: 12.4 10*3/uL — ABNORMAL HIGH (ref 4.0–10.5)
nRBC: 0 % (ref 0.0–0.2)

## 2022-03-22 LAB — HEPARIN LEVEL (UNFRACTIONATED): Heparin Unfractionated: 0.76 IU/mL — ABNORMAL HIGH (ref 0.30–0.70)

## 2022-03-22 MED ORDER — WARFARIN - PHARMACIST DOSING INPATIENT
Freq: Every day | Status: DC
Start: 2022-03-23 — End: 2022-03-23

## 2022-03-22 MED ORDER — ENOXAPARIN SODIUM 80 MG/0.8ML IJ SOSY
70.0000 mg | PREFILLED_SYRINGE | Freq: Once | INTRAMUSCULAR | Status: AC
  Administered 2022-03-23: 70 mg via SUBCUTANEOUS
  Filled 2022-03-22: qty 0.8

## 2022-03-22 MED ORDER — APIXABAN 5 MG PO TABS: 5.0000 mg | ORAL_TABLET | Freq: Two times a day (BID) | ORAL | Status: DC

## 2022-03-22 MED ORDER — WARFARIN SODIUM 5 MG PO TABS
7.5000 mg | ORAL_TABLET | Freq: Once | ORAL | Status: AC
  Administered 2022-03-22: 7.5 mg via ORAL
  Filled 2022-03-22: qty 1

## 2022-03-22 MED ORDER — APIXABAN 5 MG PO TABS
10.0000 mg | ORAL_TABLET | Freq: Two times a day (BID) | ORAL | Status: DC
Start: 2022-03-22 — End: 2022-03-22
  Administered 2022-03-22: 10 mg via ORAL
  Filled 2022-03-22: qty 2

## 2022-03-22 MED ORDER — HEPARIN (PORCINE) 25000 UT/250ML-% IV SOLN
1000.0000 [IU]/h | INTRAVENOUS | Status: AC
  Administered 2022-03-22: 1000 [IU]/h via INTRAVENOUS

## 2022-03-22 MED ORDER — ENOXAPARIN SODIUM 80 MG/0.8ML IJ SOSY: 70.0000 mg | PREFILLED_SYRINGE | Freq: Once | INTRAMUSCULAR | Status: DC

## 2022-03-22 MED ORDER — ENOXAPARIN SODIUM 80 MG/0.8ML IJ SOSY: 70.0000 mg | PREFILLED_SYRINGE | Freq: Two times a day (BID) | INTRAMUSCULAR | Status: DC

## 2022-03-22 NOTE — Progress Notes (Addendum)
ANTICOAGULATION CONSULT NOTE - follow up ? ?Pharmacy Consult for heparin ?Indication: pulmonary embolus ? ?Allergies  ?Allergen Reactions  ? Lipitor [Atorvastatin] Other (See Comments)  ?  Caused DOUBLE VISION ?  ? ? ?Patient Measurements: ?Height: '5\' 8"'$  (172.7 cm) ?Weight: 70.9 kg (156 lb 4.9 oz) ?IBW/kg (Calculated) : 68.4 ?Heparin Dosing Weight: 75.3 kg ? ?Vital Signs: ?Temp: 99 ?F (37.2 ?C) (05/06 0800) ?Temp Source: Oral (05/06 0800) ?BP: 159/107 (05/06 0800) ?Pulse Rate: 78 (05/06 0800) ? ?Labs: ?Recent Labs  ?  03/20/22 ?1202 03/20/22 ?1545 03/20/22 ?2139 03/21/22 ?4193 03/21/22 ?7902 03/21/22 ?1135 03/21/22 ?1307 03/21/22 ?2142 03/22/22 ?4097  ?HGB 14.6  --   --  14.4  --   --   --   --  14.2  ?HCT 44.2  --   --  43.0  --   --   --   --  41.4  ?PLT 148*  --   --  154  --   --   --   --  143*  ?APTT 26  --   --   --   --   --   --   --   --   ?LABPROT 13.9  --   --   --   --   --   --   --   --   ?INR 1.1  --   --   --   --   --   --   --   --   ?HEPARINUNFRC  --   --    < > 0.62  --    < > <0.10* 0.78* 0.76*  ?CREATININE 1.13  --   --  1.36*  --   --   --   --   --   ?TROPONINIHS 20* 18*  --   --  12  --   --   --   --   ? < > = values in this interval not displayed.  ? ? ? ?Estimated Creatinine Clearance: 47.5 mL/min (A) (by C-G formula based on SCr of 1.36 mg/dL (H)). ? ? ?Medical History: ?Past Medical History:  ?Diagnosis Date  ? Actinic keratosis   ? scalp  ? Arthritis   ? ASD (atrial septal defect) 06/10/2015  ? Atrial flutter, paroxysmal ([place]) 08/01/2015  ? Cancer of right kidney ([place]) 06/05/2021  ? Chronic anticoagulation 08/29/2015  ? Chronic pain of right knee 12/07/2014  ? Dysplastic nevus 01/11/2015  ? upper back spinal  ? Dysplastic nevus 01/29/2017  ? right superior calf inferior to popliteal  ? Dysrhythmia   ? AFIB  ? Goals of care, counseling/discussion 09/17/2020  ? Hypertension   ? Kidney stones   ? Obstructive sleep apnea syndrome 06/26/2015  ? PAF (paroxysmal atrial fibrillation) ([place]) 07/30/2015   ? Pericarditis   ? 1999  ? PFO (patent foramen ovale) 06/16/2018  ? Preoperative cardiovascular examination 02/03/2016  ? 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 Shortness of breath dyspnea   ? W/ EXERTION   ? Sleep apnea   ? CPAP  ? Stroke [facility])   ? 04-2015  ? ? ?Medications:  ?Scheduled:  ? amLODipine  10 mg Oral Daily  ? Chlorhexidine Gluconate Cloth  6 each Topical Daily  ? dexamethasone  4 mg Oral Daily  ? levETIRAcetam  1,500 mg Oral BID  ? mouth rinse  15 mL Mouth Rinse BID  ? phenytoin  100 mg Oral TID  ? simvastatin  20 mg Oral QHS  ? ? ?Assessment: ?71 yo male presenting with shortness of breath.  Patient had a CT scan done at PCP office this AM which showed bilateral PE with right heart strain (RV/LV ratio 1.5) with no pericardial effusion.  Patient reports he was previously taking Eliquis for history of Afib, however has been off this medication for several years and no current anticoagulation.  Pharmacy is consulted to dose heparin for PE.  ? ?Baseline labs: Hgb 14.4 (stable), platelets low 154 (stable, baseline ~200s), aPTT 26 seconds, INR 1.1 ? ?03/22/2022 ?0642 HL 0.76 slightly supra-therapeutic with heparin infusing at 1100 units/hr ?Per RN no bleeding and  no line interruption ? ?Goal of Therapy:  ?Heparin level 0.3-0.7 units/ml ?Monitor platelets by anticoagulation protocol: Yes ?  ?Plan:  ?Decrease heparin infusion to 1000 units/hr ?Then obtain 8 hour heparin level ?Monitor daily heparin level, CBC, signs/symptoms of bleeding ?F/u ability to transition to oral anticoagulation ? ?1518 Addendum: Received pharmacy consult to dose apixaban to start this evening ? ?Plan: ?Discontinue IV heparin at 1530 and associated labs ?Begin apixaban at time IV heparin stopped ?Apixaban 10 mg twice daily for 7 days followed by 5 mg twice daily ?Pharmacy will sign off but will monitor CBC, signs/symptoms bleeding ? ?Royetta Asal, PharmD, BCPS ?Clinical Pharmacist ?[place] ?Please  utilize Amion for appropriate phone number to reach the unit pharmacist ([place]) ?03/22/2022 3:19 PM ? ? ? ? ?

## 2022-03-22 NOTE — Progress Notes (Signed)
?      ? ? ? [facility] ?                                                                           ? ? ?Hervey Wedig, is a 71 y.o. male, DOB - 1951-04-06, DPO:[id number] ?Admit date - 03/20/2022    ?Outpatient Primary MD for the patient is Maryella Shivers, MD ? ?LOS - 2  days ? ?Chief Complaint  ?Patient presents with  ? Shortness of Breath  ?    ? ?Brief summary  ? ?Patient is a 71 year old male with osteoarthritis, ASD, paroxysmal A-fib, right renal cell carcinoma, hypertension, history of glioblastoma, OSA on CPAP presented to ED with progressively worsening dyspnea and fatigue that began suddenly after he had lunch on Friday afternoon last week.  No fevers, chills, wheezing, hemoptysis or chest pain, palpitations. ?In ED O2 sats 86% on room air ?Outside facility CTA chest showed bilateral pulmonary embolism with RV to LV ratio of 1.5 ?Patient was admitted for bilateral pulmonary embolism with right heart strain. ? ? ?Assessment & Plan  ? ? ?Principal Problem: ?  Bilateral pulmonary embolism ([place]), with acute respiratory failure, hypoxia ?-Possibly due to underlying malignancy.  CTA showed large bilateral PE with RV/LV ratio 1.5 ?-Pulmonology was consulted.  Not a candidate for systemic thrombolysis due to progressive glioblastoma and risk for bleeding.  Electively considering thrombectomy is not indicated and patient/family also declined.  Goals of care were addressed, DNR. ?-Venous Dopplers lower extremity negative for DVT ?- 2D echo showed EF of 60 to 65%, G1 DD, mildly reduced RV function. ?-Discussed with patient about DOACs, he has been previously on Eliquis ?-Will place on eliquis, first dose this evening per pharmacy ? ?Active Problems: ?  Obstructive sleep apnea syndrome ?-Continue CPAP qhs ? ?  PAF (paroxysmal atrial fibrillation) ([place]) ?-Rate controlled. ?-Previously used to be on Eliquis, currently on IV heparin drip ?- will start eliquis today ? ?  Dyslipidemia ?-Continue simvastatin ? ?   Hypertension ?-Continue amlodipine ? ?History of seizure ([place]) ?-Currently stable, continue Keppra, phenytoin ? ?  Frontal glioblastoma multiforme (HCC) ?-Continue dexamethasone 4 mg p.o. daily, on Gleostine every 6 weeks ?-Outpatient follow-up with Dr. Mickeal Skinner. ? ?Code Status: DNR ?DVT Prophylaxis: Currently on IV heparin drip, will start Eliquis tonight ? ? ?Level of Care: Level of care: Telemetry ?Family Communication: Updated patient ? ?Disposition Plan:      Remains inpatient appropriate: Transition to oral eliquis in the evening ?Transfer to floor today, start PT OT, hopefully DC home in a.m. if no acute issues ?Home O2 evaluation in a.m. ? ?Procedures:  ?None ? ?Consultants:   ?Pulmonary critical care ? ?Antimicrobials: None ? ? ? ?Medications ? amLODipine  10 mg Oral Daily  ? Chlorhexidine Gluconate Cloth  6 each Topical Daily  ? dexamethasone  4 mg Oral Daily  ? levETIRAcetam  1,500 mg Oral BID  ? mouth rinse  15 mL Mouth Rinse BID  ? phenytoin  100 mg Oral TID  ? simvastatin  20 mg Oral QHS  ? ? ? ? ?Subjective:  ? ?Jerel Sardina was seen and examined today.  No acute complaints per patient, on 2 L O2, sats 96  to 97%.  No chest pain, no acute shortness of breath, nausea or vomiting.  No bleeding issues.   ? ?Objective:  ? ?Vitals:  ? 03/22/22 1000 03/22/22 1100 03/22/22 1200 03/22/22 1230  ?BP: (!) 142/66 (!) 170/86 (!) 154/70   ?Pulse: 75 71 75 79  ?Resp: (!) 22 (!) 24 (!) 24 15  ?Temp:   99 ?F (37.2 ?C)   ?TempSrc:   Oral   ?SpO2: 97% 97% 96% 94%  ?Weight:      ?Height:      ? ? ?Intake/Output Summary (Last 24 hours) at 03/22/2022 1339 ?Last data filed at 03/22/2022 1323 ?Gross per 24 hour  ?Intake 688.23 ml  ?Output 800 ml  ?Net -111.77 ml  ? ? ? ?Wt Readings from Last 3 Encounters:  ?03/20/22 70.9 kg  ?03/13/22 75.6 kg  ?01/28/22 76.2 kg  ? ?Physical Exam ?General: Alert and oriented x 3, NAD ?Cardiovascular: S1 S2 clear, RRR. o pedal edema b/l ?Respiratory: CTAB, no wheezing, rales  ?Gastrointestinal:  Soft, nontender, nondistended, NBS ?Ext: no pedal edema bilaterally ?Neuro: no new deficits ?Psych: Normal affect and demeanor, alert and oriented x3  ? ? ? ? ? ?Data Reviewed:  I have personally reviewed following labs  ? ? ?CBC ?Lab Results  ?Component Value Date  ? WBC 12.4 (H) 03/22/2022  ? RBC 4.43 03/22/2022  ? HGB 14.2 03/22/2022  ? HCT 41.4 03/22/2022  ? MCV 93.5 03/22/2022  ? MCH 32.1 03/22/2022  ? PLT 143 (L) 03/22/2022  ? MCHC 34.3 03/22/2022  ? RDW 13.7 03/22/2022  ? LYMPHSABS 1.6 03/20/2022  ? MONOABS 0.5 03/20/2022  ? EOSABS 0.1 03/20/2022  ? BASOSABS 0.1 03/20/2022  ? ? ? ?Last metabolic panel ?Lab Results  ?Component Value Date  ? NA 142 03/21/2022  ? K 4.2 03/21/2022  ? CL 112 (H) 03/21/2022  ? CO2 22 03/21/2022  ? BUN 22 03/21/2022  ? CREATININE 1.36 (H) 03/21/2022  ? GLUCOSE 109 (H) 03/21/2022  ? GFRNONAA 55 (L) 03/21/2022  ? GFRAA DUPLICATE 50/35/4656  ? CALCIUM 8.9 03/21/2022  ? PROT 6.6 03/21/2022  ? ALBUMIN 3.5 03/21/2022  ? LABGLOB 2.4 02/13/2020  ? AGRATIO 1.8 02/13/2020  ? BILITOT 0.9 03/21/2022  ? ALKPHOS 137 (H) 03/21/2022  ? AST 40 03/21/2022  ? ALT 60 (H) 03/21/2022  ? ANIONGAP 8 03/21/2022  ? ? ?CBG (last 3)  ?No results for input(s): GLUCAP in the last 72 hours.  ? ? ?Coagulation Profile: ?Recent Labs  ?Lab 03/20/22 ?1202  ?INR 1.1  ? ? ? ?Radiology Studies: I have personally reviewed the imaging studies  ?ECHOCARDIOGRAM COMPLETE ? ?Result Date: 03/20/2022 ? IMPRESSIONS  1. Left ventricular ejection fraction, by estimation, is 60 to 65%. The left ventricle has normal function. The left ventricle has no regional wall motion abnormalities. There is mild left ventricular hypertrophy. Left ventricular diastolic parameters are consistent with Grade I diastolic dysfunction (impaired relaxation).  2. Right ventricular systolic function is mildly reduced. The right ventricular size is normal. Tricuspid regurgitation signal is inadequate for assessing PA pressure.  3. The mitral valve is normal  in structure. No evidence of mitral valve regurgitation.  4. The aortic valve is grossly normal. There is mild calcification of the aortic valve. Aortic valve regurgitation is not visualized.  5. The inferior vena cava is normal in size with <50% respiratory variability, suggesting right atrial pressure of 8 mmHg. Comparison(s): No prior Echocardiogram.  ?VAS [place] LOWER EXTREMITY VENOUS (DVT) ? ?Result Date: 03/21/2022 ?  Lower Venous DVT Study    Summary: BILATERAL: - No evidence of deep vein thrombosis seen in the lower extremities, bilaterally. - No evidence of superficial venous thrombosis in the lower extremities, bilaterally. - RIGHT: - A cystic structure is found in the popliteal fossa.  LEFT: - No cystic structure found in the popliteal fossa.  *See table(s) above for measurements and observations.    Preliminary    ? ? ? ? ?Estill Cotta M.D. ?[facility] ?03/22/2022, 1:39 PM ? ?Available via Epic secure chat 7am-7pm ?After 7 pm, please refer to night coverage provider listed on amion. ? ? ? ?

## 2022-03-22 NOTE — Progress Notes (Signed)
Pharmacy: Re-anticoagulation ? ? ?Patient is a 71 y.o M with frontal glioblastoma multiforme, seizures (per pt's wife, on keppra and recently started on phenytoin on 02/28/22) and hx PAF (on Eliquis in the past) who presented to the ED on 03/20/22 with c/o SOB.  He was found to have bilateral PE with right heart strain.  Patient was started on heparin drip on admission and changed Eliquis earlier today. ? ?Eliquis and phenytoin have significant drug-drug interactions (  phenytoin can reduce Eliquis conc.). Of note, both Eliquis and xarelto are contraindicated with Phenytoin. Discussed with Dr.  Hal Hope -- He's ok to change to warfarin and bridge with lovenox until INR is therapeutic. ? ? ?Plan: ?- d/c Eliquis ?- start lovenox 70 mg SQ q12h ?- Eliquis can falsely increase INR so will check INR at noon on 03/23/22 to ensure most of drug has been cleared  in order to get a more accurate level ?- warfarin 7.5 mg PO x1 tonight ?- monitor for s/sx bleeding ? ? ?Dia Sitter, PharmD, BCPS ?03/22/2022 9:42 PM ? ?

## 2022-03-22 NOTE — Progress Notes (Signed)
Pt refused CPAP and resting well no distress noted machine available if patient changes his mind and/or if become distressed ?

## 2022-03-22 NOTE — Progress Notes (Signed)
Received patient from ICU via bed.  Alert and oriented x 4, assisted in position of comfort.  Oriented to room and unit routine, needs addressed. ?

## 2022-03-22 NOTE — Discharge Instructions (Addendum)
Information on my medicine - ELIQUIS? (apixaban) ? ?This medication education was reviewed with me or my healthcare representative as part of my discharge preparation.  The pharmacist that spoke with me during my hospital stay was:  ?Rickey Barbara, Student-PharmD ? ?Why was Eliquis? prescribed for you? ?Eliquis? was prescribed to treat blood clots that may have been found in the veins of your legs (deep vein thrombosis) or in your lungs (pulmonary embolism) and to reduce the risk of them occurring again. ? ?What do You need to know about Eliquis? ? ?The starting dose is 10 mg (two 5 mg tablets) taken TWICE daily for the FIRST SEVEN (7) DAYS, then on May 14th, 2023 in the [facility] the dose is reduced to ONE 5 mg tablet taken TWICE daily.  Eliquis? may be taken with or without food.  ? ?Try to take the dose about the same time in the morning and in the evening. If you have difficulty swallowing the tablet whole please discuss with your pharmacist how to take the medication safely. ? ?Take Eliquis? exactly as prescribed and DO NOT stop taking Eliquis? without talking to the doctor who prescribed the medication.  Stopping may increase your risk of developing a new blood clot.  Refill your prescription before you run out. ? ?After discharge, you should have regular check-up appointments with your healthcare provider that is prescribing your Eliquis?. ?   ?What do you do if you miss a dose? ?If a dose of ELIQUIS? is not taken at the scheduled time, take it as soon as possible on the same day and twice-daily administration should be resumed. The dose should not be doubled to make up for a missed dose. ? ?Important Safety Information ?A possible side effect of Eliquis? is bleeding. You should call your healthcare provider right away if you experience any of the following: ?Bleeding from an injury or your nose that does not stop. ?Unusual colored urine (red or dark brown) or unusual colored stools (red or black). ?Unusual  bruising for unknown reasons. ?A serious fall or if you hit your head (even if there is no bleeding). ? ?Some medicines may interact with Eliquis? and might increase your risk of bleeding or clotting while on Eliquis?Marland Kitchen To help avoid this, consult your healthcare provider or pharmacist prior to using any new prescription or non-prescription medications, including herbals, vitamins, non-steroidal anti-inflammatory drugs (NSAIDs) and supplements. ? ?This website has more information on Eliquis? (apixaban): http://www.eliquis.com/eliquis/home  ?

## 2022-03-23 DIAGNOSIS — I10 Essential (primary) hypertension: Secondary | ICD-10-CM | POA: Diagnosis not present

## 2022-03-23 DIAGNOSIS — I2699 Other pulmonary embolism without acute cor pulmonale: Secondary | ICD-10-CM | POA: Diagnosis not present

## 2022-03-23 DIAGNOSIS — C711 Malignant neoplasm of frontal lobe: Secondary | ICD-10-CM | POA: Diagnosis not present

## 2022-03-23 DIAGNOSIS — E785 Hyperlipidemia, unspecified: Secondary | ICD-10-CM | POA: Diagnosis not present

## 2022-03-23 LAB — CBC
HCT: 40.6 % (ref 39.0–52.0)
Hemoglobin: 13.5 g/dL (ref 13.0–17.0)
MCH: 31.4 pg (ref 26.0–34.0)
MCHC: 33.3 g/dL (ref 30.0–36.0)
MCV: 94.4 fL (ref 80.0–100.0)
Platelets: 143 10*3/uL — ABNORMAL LOW (ref 150–400)
RBC: 4.3 MIL/uL (ref 4.22–5.81)
RDW: 14.1 % (ref 11.5–15.5)
WBC: 10.9 10*3/uL — ABNORMAL HIGH (ref 4.0–10.5)
nRBC: 0 % (ref 0.0–0.2)

## 2022-03-23 LAB — PROTIME-INR
INR: 1.3 — ABNORMAL HIGH (ref 0.8–1.2)
Prothrombin Time: 15.7 seconds — ABNORMAL HIGH (ref 11.4–15.2)

## 2022-03-23 MED ORDER — APIXABAN 5 MG PO TABS
10.0000 mg | ORAL_TABLET | Freq: Two times a day (BID) | ORAL | Status: DC
  Administered 2022-03-23 – 2022-03-24 (×2): 10 mg via ORAL
  Filled 2022-03-23 (×2): qty 2

## 2022-03-23 MED ORDER — APIXABAN 5 MG PO TABS: 5.0000 mg | ORAL_TABLET | Freq: Two times a day (BID) | ORAL | Status: DC

## 2022-03-23 MED ORDER — LACOSAMIDE 50 MG PO TABS
100.0000 mg | ORAL_TABLET | Freq: Two times a day (BID) | ORAL | Status: DC
  Administered 2022-03-23 – 2022-03-24 (×3): 100 mg via ORAL
  Filled 2022-03-23 (×3): qty 2

## 2022-03-23 NOTE — Progress Notes (Signed)
ANTICOAGULATION CONSULT NOTE ? ?Pharmacy Consult for Eliquis ?Indication: pulmonary embolus ? ?Allergies  ?Allergen Reactions  ? Lipitor [Atorvastatin] Other (See Comments)  ?  Caused DOUBLE VISION ?  ? ? ?Patient Measurements: ?Height: '5\' 8"'$  (172.7 cm) ?Weight: 70.9 kg (156 lb 4.9 oz) ?IBW/kg (Calculated) : 68.4 ?Heparin Dosing Weight: 75.3 kg ? ?Vital Signs: ?Temp: 97.9 ?F (36.6 ?C) (05/07 1254) ?Temp Source: Oral (05/07 1254) ?BP: 128/79 (05/07 1254) ?Pulse Rate: 74 (05/07 1254) ? ?Labs: ?Recent Labs  ?  03/20/22 ?1545 03/20/22 ?2139 03/21/22 ?1540 03/21/22 ?0867 03/21/22 ?1135 03/21/22 ?1307 03/21/22 ?2142 03/22/22 ?6195 03/23/22 ?0408 03/23/22 ?1216  ?HGB  --    < > 14.4  --   --   --   --  14.2 13.5  --   ?HCT  --   --  43.0  --   --   --   --  41.4 40.6  --   ?PLT  --   --  154  --   --   --   --  143* 143*  --   ?LABPROT  --   --   --   --   --   --   --   --   --  15.7*  ?INR  --   --   --   --   --   --   --   --   --  1.3*  ?HEPARINUNFRC  --    < > 0.62  --    < > <0.10* 0.78* 0.76*  --   --   ?CREATININE  --   --  1.36*  --   --   --   --   --   --   --   ?TROPONINIHS 18*  --   --  12  --   --   --   --   --   --   ? < > = values in this interval not displayed.  ? ? ? ?Estimated Creatinine Clearance: 47.5 mL/min (A) (by C-G formula based on SCr of 1.36 mg/dL (H)). ? ? ?Medical History: ?Past Medical History:  ?Diagnosis Date  ? Actinic keratosis   ? scalp  ? Arthritis   ? ASD (atrial septal defect) 06/10/2015  ? Atrial flutter, paroxysmal ([place]) 08/01/2015  ? Cancer of right kidney ([place]) 06/05/2021  ? Chronic anticoagulation 08/29/2015  ? Chronic pain of right knee 12/07/2014  ? Dysplastic nevus 01/11/2015  ? upper back spinal  ? Dysplastic nevus 01/29/2017  ? right superior calf inferior to popliteal  ? Dysrhythmia   ? AFIB  ? Goals of care, counseling/discussion 09/17/2020  ? Hypertension   ? Kidney stones   ? Obstructive sleep apnea syndrome 06/26/2015  ? PAF (paroxysmal atrial fibrillation) ([place]) 07/30/2015   ? Pericarditis   ? 1999  ? PFO (patent foramen ovale) 06/16/2018  ? Preoperative cardiovascular examination 02/03/2016  ? 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 Shortness of breath dyspnea   ? W/ EXERTION   ? Sleep apnea   ? CPAP  ? Stroke [facility])   ? 04-2015  ? ? ?Medications:  ?Scheduled:  ? amLODipine  10 mg Oral Daily  ? apixaban  10 mg Oral BID  ? Followed by  ? [START ON 03/30/2022] apixaban  5 mg Oral BID  ? Chlorhexidine Gluconate Cloth  6 each Topical Daily  ? dexamethasone  4 mg Oral Daily  ? lacosamide  100 mg  Oral BID  ? levETIRAcetam  1,500 mg Oral BID  ? mouth rinse  15 mL Mouth Rinse BID  ? simvastatin  20 mg Oral QHS  ? ? ?Assessment: ?71 yo male presenting with shortness of breath.  Patient had a CT scan done at PCP office this AM which showed bilateral PE with right heart strain (RV/LV ratio 1.5) with no pericardial effusion.  Patient reports he was previously taking Eliquis for history of Afib, however has been off this medication for several years and no current anticoagulation.  Pharmacy was consulted to dose heparin for PE. Patient was then transitioned to Eliquis but that was transitioned to [place] given significant drug interaction between Eliquis and phenytoin which patient was recently started on for breakthrough seizures on Keppra. ? ?5/7 Dr. Tana Coast discussed with Dr. Mickeal Skinner, who recommends changing phenytoin to Vimpat as he prefers patient to be on DOAC rather than warfarin. Medications changed and plan to restart Eliquis. ? ?  ?Plan:  ?-Patient received enoxaparin this morning ~ 0300. ?-Restart Eliquis 10 mg BID x 7 days, followed by 5 mg BID. ?-Will provide patient education 5/8 prior to discharge. ? ?Tawnya Crook, PharmD, BCPS ?Clinical Pharmacist ?03/23/2022 1:56 PM ? ? ? ? ? ?

## 2022-03-23 NOTE — Progress Notes (Addendum)
?      ? ? ? [facility] ?                                                                           ? ? ?Primus Gritton, is a 71 y.o. male, DOB - 1950/12/27, ZOX:[id number] ?Admit date - 03/20/2022    ?Outpatient Primary MD for the patient is Maryella Shivers, MD ? ?LOS - 3  days ? ?Chief Complaint  ?Patient presents with  ? Shortness of Breath  ?    ? ?Brief summary  ? ?Patient is a 71 year old male with osteoarthritis, ASD, paroxysmal A-fib, right renal cell carcinoma, hypertension, history of glioblastoma, OSA on CPAP presented to ED with progressively worsening dyspnea and fatigue that began suddenly after he had lunch on Friday afternoon last week.  No fevers, chills, wheezing, hemoptysis or chest pain, palpitations. ?In ED O2 sats 86% on room air ?Outside facility CTA chest showed bilateral pulmonary embolism with RV to LV ratio of 1.5 ?Patient was admitted for bilateral pulmonary embolism with right heart strain. ? ? ?Assessment & Plan  ? ? ?Principal Problem: ?  Bilateral pulmonary embolism ([place]), with acute respiratory failure, hypoxia ?-Possibly due to underlying malignancy.  CTA showed large bilateral PE with RV/LV ratio 1.5 ?-Pulmonology was consulted.  Not a candidate for systemic thrombolysis due to progressive glioblastoma and risk for bleeding.  Electively considering thrombectomy is not indicated and patient/family also declined.  Goals of care were addressed, DNR. ?-Venous Dopplers lower extremity negative for DVT ?- 2D echo showed EF of 60 to 65%, G1 DD, mildly reduced RV function. ?-Initial plan was to switch him to [place] however patient is on phenytoin for seizures which has significant interaction, hence placed therapeutic Lovenox and Coumadin per pharmacy. ?-Discussed with patient and family.  Will need Lovenox teaching and home health RN.  Will discuss with Dr. Mickeal Skinner ?Addendum: 12:35pm ?Discussed with Dr. Mickeal Skinner, recommended changing Dilantin to Vimpat 100 mg twice daily.  Prefer to keep  him on DOACs, pharmacy consulted.  Explained to the patient's wife. ? ?Active Problems: ?  Obstructive sleep apnea syndrome ?-Continue CPAP qhs ? ?  PAF (paroxysmal atrial fibrillation) ([place]) ?-Rate controlled. ?-Placed on Lovenox with Coumadin for #1.  Coumadin per pharmacy. ? ? ?  Dyslipidemia ?-Continue simvastatin ? ?  Hypertension ?-Continue amlodipine ? ?History of seizure ([place]) ?-Currently stable, continue Keppra, phenytoin ?-Per patient/family, seizures were not controlled fully on Keppra hence Dilantin was added (and Decadron) and since then patient has been stable. ? ?  Frontal glioblastoma multiforme ([place]) ?-Continue dexamethasone 4 mg p.o. daily, on Gleostine every 6 weeks ?-Outpatient follow-up with Dr. Mickeal Skinner. ? ?Code Status: DNR ?DVT Prophylaxis: Started on Lovenox, Coumadin ? ? ?Level of Care: Level of care: Telemetry ?Family Communication: Updated patient's wife and son at the bedside ? ?Disposition Plan:      Remains inpatient appropriate: Started on Lovenox, Coumadin, will need Lovenox teaching ?Home O2 evaluation prior to discharge ? ?Procedures:  ?None ? ?Consultants:   ?Pulmonary critical care ? ?Antimicrobials: None ? ? ? ?Medications ? amLODipine  10 mg Oral Daily  ? Chlorhexidine Gluconate Cloth  6 each Topical Daily  ? dexamethasone  4 mg Oral Daily  ?  enoxaparin (LOVENOX) injection  70 mg Subcutaneous Once  ? [START ON 03/24/2022] enoxaparin (LOVENOX) injection  70 mg Subcutaneous Q12H  ? levETIRAcetam  1,500 mg Oral BID  ? mouth rinse  15 mL Mouth Rinse BID  ? phenytoin  100 mg Oral TID  ? simvastatin  20 mg Oral QHS  ? Warfarin - Pharmacist Dosing Inpatient   Does not apply Y6503  ? ? ? ? ?Subjective:  ? ?Kenny Rea was seen and examined today.  No acute complaints per patient.  Eating breakfast.  Family at bedside.  No acute chest pain or shortness of breath.  No fevers chills.   ? ?Objective:  ? ?Vitals:  ? 03/22/22 1606 03/22/22 1932 03/23/22 0157 03/23/22 5465  ?BP: 130/70 (!)  145/88 (!) 141/82 140/76  ?Pulse: 79 82 81 72  ?Resp: (!) '22 20 20 18  '$ ?Temp: 99 ?F (37.2 ?C) 98 ?F (36.7 ?C) 98.3 ?F (36.8 ?C) 97.7 ?F (36.5 ?C)  ?TempSrc: Oral Oral Oral Oral  ?SpO2: 96% 97% 96% 94%  ?Weight:      ?Height:      ? ? ?Intake/Output Summary (Last 24 hours) at 03/23/2022 1210 ?Last data filed at 03/22/2022 2100 ?Gross per 24 hour  ?Intake 720 ml  ?Output 400 ml  ?Net 320 ml  ? ? ? ?Wt Readings from Last 3 Encounters:  ?03/20/22 70.9 kg  ?03/13/22 75.6 kg  ?01/28/22 76.2 kg  ? ?Physical Exam ?General: Alert and oriented x 3, NAD ?Cardiovascular: S1 S2 clear, RRR.  ?Respiratory: CTAB ?Gastrointestinal: Soft, nontender, nondistended, NBS ?Ext: no pedal edema bilaterally ?Psych: Normal affect and demeanor, alert and oriented x3  ? ? ?Data Reviewed:  I have personally reviewed following labs  ? ? ?CBC ?Lab Results  ?Component Value Date  ? WBC 10.9 (H) 03/23/2022  ? RBC 4.30 03/23/2022  ? HGB 13.5 03/23/2022  ? HCT 40.6 03/23/2022  ? MCV 94.4 03/23/2022  ? MCH 31.4 03/23/2022  ? PLT 143 (L) 03/23/2022  ? MCHC 33.3 03/23/2022  ? RDW 14.1 03/23/2022  ? LYMPHSABS 1.6 03/20/2022  ? MONOABS 0.5 03/20/2022  ? EOSABS 0.1 03/20/2022  ? BASOSABS 0.1 03/20/2022  ? ? ? ?Last metabolic panel ?Lab Results  ?Component Value Date  ? NA 142 03/21/2022  ? K 4.2 03/21/2022  ? CL 112 (H) 03/21/2022  ? CO2 22 03/21/2022  ? BUN 22 03/21/2022  ? CREATININE 1.36 (H) 03/21/2022  ? GLUCOSE 109 (H) 03/21/2022  ? GFRNONAA 55 (L) 03/21/2022  ? GFRAA DUPLICATE 68/10/7516  ? CALCIUM 8.9 03/21/2022  ? PROT 6.6 03/21/2022  ? ALBUMIN 3.5 03/21/2022  ? LABGLOB 2.4 02/13/2020  ? AGRATIO 1.8 02/13/2020  ? BILITOT 0.9 03/21/2022  ? ALKPHOS 137 (H) 03/21/2022  ? AST 40 03/21/2022  ? ALT 60 (H) 03/21/2022  ? ANIONGAP 8 03/21/2022  ? ? ?CBG (last 3)  ?No results for input(s): GLUCAP in the last 72 hours.  ? ? ?Coagulation Profile: ?Recent Labs  ?Lab 03/20/22 ?1202  ?INR 1.1  ? ? ? ?Radiology Studies: I have personally reviewed the imaging studies   ?ECHOCARDIOGRAM COMPLETE ? ?Result Date: 03/20/2022 ? IMPRESSIONS  1. Left ventricular ejection fraction, by estimation, is 60 to 65%. The left ventricle has normal function. The left ventricle has no regional wall motion abnormalities. There is mild left ventricular hypertrophy. Left ventricular diastolic parameters are consistent with Grade I diastolic dysfunction (impaired relaxation).  2. Right ventricular systolic function is mildly reduced. The right ventricular size is  normal. Tricuspid regurgitation signal is inadequate for assessing PA pressure.  3. The mitral valve is normal in structure. No evidence of mitral valve regurgitation.  4. The aortic valve is grossly normal. There is mild calcification of the aortic valve. Aortic valve regurgitation is not visualized.  5. The inferior vena cava is normal in size with <50% respiratory variability, suggesting right atrial pressure of 8 mmHg. Comparison(s): No prior Echocardiogram.  ?VAS [place] LOWER EXTREMITY VENOUS (DVT) ? ?Result Date: 03/21/2022 ? Lower Venous DVT Study    Summary: BILATERAL: - No evidence of deep vein thrombosis seen in the lower extremities, bilaterally. - No evidence of superficial venous thrombosis in the lower extremities, bilaterally. - RIGHT: - A cystic structure is found in the popliteal fossa.  LEFT: - No cystic structure found in the popliteal fossa.  *See table(s) above for measurements and observations.    Preliminary    ? ? ? ? ?Estill Cotta M.D. ?[facility] ?03/23/2022, 12:10 PM ? ?Available via Epic secure chat 7am-7pm ?After 7 pm, please refer to night coverage provider listed on amion. ? ? ? ?

## 2022-03-23 NOTE — Evaluation (Signed)
Physical Therapy Evaluation ?Patient Details ?Name: Mathew Leach ?MRN: [id number] ?DOB: 11-08-1951 ?Today's Date: 03/23/2022 ? ?History of Present Illness ? 71 yo male admitted with bil PE with R heart strain. Hx of CVA, OSA, impaired hearing, Afib, renal cell ca, glioblastoma, Sz  ?Clinical Impression ? On eval, pt required Min guard-Min A for mobility. He walked ~75 feet with some steadying assistance-no device used on today. O2: 93% on RA at rest, 90% on RA with ambulation. Pt tolerated activity well. Assisted pt back to bed to rest and visit with his family. Will plan to follow and progress activity as tolerated.    ?   ? ?Recommendations for follow up therapy are one component of a multi-disciplinary discharge planning process, led by the attending physician.  Recommendations may be updated based on patient status, additional functional criteria and insurance authorization. ? ?Follow Up Recommendations Home health PT ? ?  ?Assistance Recommended at Discharge Frequent or constant Supervision/Assistance  ?Patient can return home with the following ? A little help with walking and/or transfers;Assist for transportation ? ?  ?Equipment Recommendations None recommended by PT  ?Recommendations for Other Services ?    ?  ?Functional Status Assessment Patient has had a recent decline in their functional status and demonstrates the ability to make significant improvements in function in a reasonable and predictable amount of time.  ? ?  ?Precautions / Restrictions Precautions ?Precautions: Fall ?Restrictions ?Weight Bearing Restrictions: No  ? ?  ? ?Mobility ? Bed Mobility ?Overal bed mobility: Needs Assistance ?Bed Mobility: Supine to Sit, Sit to Supine ?  ?  ?Supine to sit: Supervision, HOB elevated ?Sit to supine: Supervision, HOB elevated ?  ?General bed mobility comments: Increased time. ?  ? ?Transfers ?Overall transfer level: Needs assistance ?Equipment used: None ?Transfers: Sit to/from Stand ?Sit to Stand:  Min guard ?  ?  ?  ?  ?  ?General transfer comment: Increased time. Cues for safety, technique. ?  ? ?Ambulation/Gait ?Ambulation/Gait assistance: Min assist ?Gait Distance (Feet): 75 Feet ?Assistive device: None (intermittent hallway handral use) ?Gait Pattern/deviations: Decreased stride length ?  ?  ?  ?General Gait Details: Quick, shortened step lengths. Intermittent assist to steady. O2 90% on RA. Mildly unsteady but no overt LOB. Cues for pacing, increased step lengths ? ?Stairs ?  ?  ?  ?  ?  ? ?Wheelchair Mobility ?  ? ?Modified Rankin (Stroke Patients Only) ?  ? ?  ? ?Balance Overall balance assessment: Needs assistance ?  ?  ?  ?  ?Standing balance support: During functional activity ?Standing balance-Leahy Scale: Fair ?  ?  ?  ?  ?  ?  ?  ?  ?  ?  ?  ?  ?   ? ? ? ?Pertinent Vitals/Pain Pain Assessment ?Pain Assessment: No/denies pain  ? ? ?Home Living Family/patient expects to be discharged to:: Private residence ?Living Arrangements: Spouse/significant other ?Available Help at Discharge: Family;Available 24 hours/day ?Type of Home: House ?Home Access: Stairs to enter ?  ?  ?  ?Home Layout: Able to live on main level with bedroom/bathroom ?Home Equipment: Rollator (4 wheels);Rolling Walker (2 wheels);Cane - single point ?   ?  ?Prior Function Prior Level of Function : Independent/Modified Independent ?  ?  ?  ?  ?  ?  ?  ?  ?  ? ? ?Hand Dominance  ?   ? ?  ?Extremity/Trunk Assessment  ? Upper Extremity Assessment ?Upper Extremity Assessment: Overall [facility]  for tasks assessed ?  ? ?Lower Extremity Assessment ?Lower Extremity Assessment: Generalized weakness ?  ? ?Cervical / Trunk Assessment ?Cervical / Trunk Assessment: Normal  ?Communication  ? Communication: HOH  ?Cognition Arousal/Alertness: Awake/alert ?Behavior During Therapy: [facility] for tasks assessed/performed ?Overall Cognitive Status: Within Functional Limits for tasks assessed ?  ?  ?  ?  ?  ?  ?  ?  ?  ?  ?  ?  ?  ?  ?  ?  ?  ?  ?  ? ?  ?General  Comments   ? ?  ?Exercises    ? ?Assessment/Plan  ?  ?PT Assessment Patient needs continued PT services  ?PT Problem List Decreased strength;Decreased mobility;Decreased balance;Decreased activity tolerance;Decreased knowledge of use of DME ? ?   ?  ?PT Treatment Interventions DME instruction;Gait training;Therapeutic activities;Therapeutic exercise;Patient/family education;Balance training;Functional mobility training   ? ?PT Goals (Current goals can be found in the Care Plan section)  ?Acute Rehab PT Goals ?Patient Stated Goal: to regain PLOF/independence and return home ?PT Goal Formulation: With patient/family ?Time For Goal Achievement: 04/06/22 ?Potential to Achieve Goals: Good ? ?  ?Frequency Min 3X/week ?  ? ? ?Co-evaluation   ?  ?  ?  ?  ? ? ?  ?AM-PAC PT "6 Clicks" Mobility  ?Outcome Measure Help needed turning from your back to your side while in a flat bed without using bedrails?: A Little ?Help needed moving from lying on your back to sitting on the side of a flat bed without using bedrails?: A Little ?Help needed moving to and from a bed to a chair (including a wheelchair)?: A Little ?Help needed standing up from a chair using your arms (e.g., wheelchair or bedside chair)?: A Little ?Help needed to walk in hospital room?: A Little ?Help needed climbing 3-5 steps with a railing? : A Little ?6 Click Score: 18 ? ?  ?End of Session Equipment Utilized During Treatment: Gait belt ?Activity Tolerance: Patient tolerated treatment well ?Patient left: in bed;with call bell/phone within reach;with family/visitor present;with bed alarm set ?  ?PT Visit Diagnosis: Muscle weakness (generalized) (M62.81);Difficulty in walking, not elsewhere classified (R26.2) ?  ? ?Time: 1254-1310 ?PT Time Calculation (min) (ACUTE ONLY): 16 min ? ? ?Charges:   PT Evaluation ?$PT Eval Moderate Complexity: 1 Mod ?  ?  ?   ? ? ? ? ? ?Yuvia Plant P, PT ?Acute Rehabilitation  ?Office: [phone] ?Pager: [phone] ? ?  ? ?

## 2022-03-23 NOTE — Progress Notes (Signed)
Patient refusing cpap. RT encouraged to call if changed miind ?

## 2022-03-24 ENCOUNTER — Other Ambulatory Visit (HOSPITAL_COMMUNITY): Payer: Self-pay

## 2022-03-24 ENCOUNTER — Encounter: Payer: Self-pay | Admitting: Internal Medicine

## 2022-03-24 DIAGNOSIS — E785 Hyperlipidemia, unspecified: Secondary | ICD-10-CM | POA: Diagnosis not present

## 2022-03-24 DIAGNOSIS — I10 Essential (primary) hypertension: Secondary | ICD-10-CM | POA: Diagnosis not present

## 2022-03-24 DIAGNOSIS — I2699 Other pulmonary embolism without acute cor pulmonale: Secondary | ICD-10-CM | POA: Diagnosis not present

## 2022-03-24 DIAGNOSIS — C711 Malignant neoplasm of frontal lobe: Secondary | ICD-10-CM | POA: Diagnosis not present

## 2022-03-24 LAB — CBC
HCT: 40.9 % (ref 39.0–52.0)
Hemoglobin: 14.1 g/dL (ref 13.0–17.0)
MCH: 32.3 pg (ref 26.0–34.0)
MCHC: 34.5 g/dL (ref 30.0–36.0)
MCV: 93.6 fL (ref 80.0–100.0)
Platelets: 164 10*3/uL (ref 150–400)
RBC: 4.37 MIL/uL (ref 4.22–5.81)
RDW: 13.7 % (ref 11.5–15.5)
WBC: 8.8 10*3/uL (ref 4.0–10.5)
nRBC: 0 % (ref 0.0–0.2)

## 2022-03-24 MED ORDER — APIXABAN 5 MG PO TABS
ORAL_TABLET | ORAL | 3 refills | Status: DC
  Filled 2022-03-24: qty 60, 30d supply, fill #0

## 2022-03-24 MED ORDER — APIXABAN 5 MG PO TABS
5.0000 mg | ORAL_TABLET | Freq: Two times a day (BID) | ORAL | 3 refills | Status: AC
Start: 2022-03-30 — End: ?
  Filled 2022-03-24: qty 60, 30d supply, fill #0

## 2022-03-24 MED ORDER — APIXABAN (ELIQUIS) VTE STARTER PACK (10MG AND 5MG)
ORAL_TABLET | ORAL | 0 refills | Status: DC
  Filled 2022-03-24: qty 74, 30d supply, fill #0

## 2022-03-24 MED ORDER — LACOSAMIDE 100 MG PO TABS
100.0000 mg | ORAL_TABLET | Freq: Two times a day (BID) | ORAL | 3 refills | Status: DC
Start: 2022-03-24 — End: 2022-04-07
  Filled 2022-03-24: qty 60, 30d supply, fill #0

## 2022-03-24 NOTE — TOC Progression Note (Signed)
Transition of Care (TOC) - Progression Note  ? ? ?Patient Details  ?Name: Mathew Leach ?MRN: [id number] ?Date of Birth: November 18, 1950 ? ?Transition of Care (TOC) CM/SW Contact  ?Purcell Mouton, RN ?Phone Number: ?03/24/2022, 11:58 AM ? ?Clinical Narrative:    ?[facility] was called to make aware that pt would discharge today. Oval Linsey ask for [facility] with HHPT.  ? ? ?  ?  ? ?Expected Discharge Plan and Services ?  ?  ?  ?  ?  ?Expected Discharge Date: 03/24/22               ?  ?  ?  ?  ?  ?  ?  ?  ?  ?  ? ? ?Social Determinants of Health (SDOH) Interventions ?  ? ?Readmission Risk Interventions ?   ? View : No data to display.  ?  ?  ?  ? ? ?

## 2022-03-24 NOTE — Discharge Summary (Signed)
?Physician Discharge Summary ?  ?Patient: Mathew Leach MRN: [id number] DOB: [birth year]  ?Admit date:     03/20/2022  ?Discharge date: 03/24/22  ?Discharge Physician: Estill Cotta, MD   ? ?PCP: Maryella Shivers, MD  ? ?Recommendations at discharge:  ? ?Started on Vimpat 100 mg twice daily ?Discontinued phenytoin due to drug-drug interaction with eliquis  ?Started on Eliquis 10 mg p.o. twice daily for 1 week, then start 5 mg twice daily on 03/30/2022.  Continue likely indefinitely or until advised to stop by his medical team ? ?Discharge Diagnoses: ? ?  Bilateral pulmonary embolism (HCC) ?Acute respiratory failure with hypoxia ?  History of seizures ?  Obstructive sleep apnea syndrome ?  PAF (paroxysmal atrial fibrillation) ([place]) ?  Dyslipidemia ?  Hypertension ?Generalized debility ?  Frontal glioblastoma multiforme (HCC) ? ? ?Hospital Course: ? ?  ?Patient is a 71 year old male with osteoarthritis, ASD, paroxysmal A-fib, right renal cell carcinoma, hypertension, history of glioblastoma, OSA on CPAP presented to ED with progressively worsening dyspnea and fatigue that began suddenly after he had lunch on Friday afternoon last week.  No fevers, chills, wheezing, hemoptysis or chest pain, palpitations. ?In ED O2 sats 86% on room air ?Outside facility CTA chest showed bilateral pulmonary embolism with RV to LV ratio of 1.5 ?Patient was admitted for bilateral pulmonary embolism with right heart strain. ?  ?Assessment and Plan: ? ?Bilateral pulmonary embolism ([place]), with acute respiratory failure, hypoxia ?-Possibly due to underlying malignancy.  CTA showed large bilateral PE with RV/LV ratio 1.5 ?-Pulmonology was consulted.  Not a candidate for systemic thrombolysis due to progressive glioblastoma and risk for bleeding.  Electively considering thrombectomy is not indicated and patient/family also declined.  Goals of care were addressed, DNR. ?-Venous Dopplers lower extremity negative for DVT ?- 2D echo showed EF of  60 to 65%, G1 DD, mildly reduced RV function. ?-Initial plan was to switch him to [place] however patient is on phenytoin for seizures which has significant interaction with eliquis. Discussed with Dr. Mickeal Skinner, recommended changing Dilantin to Vimpat 100 mg twice daily.  Prefer to keep him on DOACs ?-Started on Vimpat 100 mg p.o. twice daily and eliquis 10 mg twice daily for 1 week, then continue 5 mg twice daily indefinitely or if advised by his medical team to discontinue ?-Home O2 evaluation was completed and patient does not qualify for O2 ?  ?  Obstructive sleep apnea syndrome ?-Continue CPAP qhs ?  ?  PAF (paroxysmal atrial fibrillation) (HCC) ?-Rate controlled. ?-Continue eliquis ?  ?  ?  Dyslipidemia ?-Continue simvastatin ?  ?  Hypertension ?-Continue amlodipine ?  ?History of seizure (Mathew Leach) ?-Per patient/family, seizures were not controlled fully on Keppra hence Dilantin was added (and Decadron) and since then patient has been stable. ?-Phenytoin discontinued, started on Vimpat 100 mg twice daily, this was discussed in detail with Dr. Mickeal Skinner.  Continue Keppra. ?  ?  Frontal glioblastoma multiforme (Mathew Leach) ?-Continue dexamethasone 4 mg p.o. daily, on Gleostine every 6 weeks ?-Outpatient follow-up with Dr. Mickeal Skinner. ?  ?Generalized debility ?-Home health PT OT ordered ? ? ? ?Pain control - [employer] Controlled Substance Reporting System database was reviewed. and patient was instructed, not to drive, operate heavy machinery, perform activities at heights, swimming or participation in water activities or provide baby-sitting services while on Pain, Sleep and Anxiety Medications; until their outpatient Physician has advised to do so again. Also recommended to not to take more than prescribed Pain, Sleep and Anxiety Medications.  ?  Consultants: Discussed with Dr. Mickeal Skinner on the phone ?Procedures performed: None ?Disposition: Home ?Diet recommendation:  ?Cardiac diet ?DISCHARGE MEDICATION: ?Allergies as of 03/24/2022    ? ?   Reactions  ? Lipitor [atorvastatin] Other (See Comments)  ? Caused DOUBLE VISION  ? ?  ? ?  ?Medication List  ?  ? ?STOP taking these medications   ? ?phenytoin 100 MG ER capsule ?Commonly known as: DILANTIN ?  ? ?  ? ?TAKE these medications   ? ?amLODipine 10 MG tablet ?Commonly known as: NORVASC ?Take 10 mg by mouth daily. ?  ?dexamethasone 4 MG tablet ?Commonly known as: DECADRON ?Take 4 mg by mouth daily. ?  ?Eliquis DVT/PE Starter Pack ?Generic drug: Apixaban Starter Pack ('10mg'$  and '5mg'$ ) ?Take as directed on package: start with two-'5mg'$  tablets twice daily for 7 days. On day 8, switch to one-'5mg'$  tablet twice daily. ?  ?apixaban 5 MG Tabs tablet ?Commonly known as: ELIQUIS ?Take 1 tablet (5 mg total) by mouth 2 (two) times daily. START this prescription after you have completed the starter pack of apixaban. ?Start taking on: Mar 30, 2022 ?  ?Lacosamide 100 MG Tabs ?Take 1 tablet (100 mg total) by mouth 2 (two) times daily. ?  ?levETIRAcetam 750 MG tablet ?Commonly known as: KEPPRA ?Take 2 tablets (1,500 mg total) by mouth 2 (two) times daily. ?  ?lomustine 100 MG capsule ?Commonly known as: [place] ?Take 200 mg by mouth as directed. Take on an emtpy stomach 1 hour before or 2 hours after meals. Caution:chemotherapy every 6 weeks ?  ?loperamide 2 MG capsule ?Commonly known as: IMODIUM ?Take 2 mg by mouth daily as needed for diarrhea or loose stools. ?  ?LORazepam 1 MG tablet ?Commonly known as: Ativan ?Take 1 tablet (1 mg total) by mouth every 8 (eight) hours as needed for seizure. ?  ?ondansetron 8 MG tablet ?Commonly known as: Zofran ?Take 1 tablet (8 mg total) by mouth 2 (two) times daily as needed for nausea or vomiting. ?  ?simvastatin 20 MG tablet ?Commonly known as: ZOCOR ?Take 1 tablet (20 mg total) by mouth daily. ?What changed: when to take this ?  ? ?  ? ? Follow-up Information   ? ? Maryella Shivers, MD. Schedule an appointment as soon as possible for a visit in 2 week(s).   ?Specialty: Family  Medicine ?Why: for hospital follow-up ?Contact information: ?[address] ?[place] [address] ?[phone] ? ? ?  ?  ? ? Ventura Sellers, MD. Schedule an appointment as soon as possible for a visit in 2 week(s).   ?Specialties: Psychiatry, Neurology, Oncology ?Why: for hospital follow-up ?Contact information: ?[address] ?[place] [address] ?[phone] ? ? ?  ?  ? ?  ?  ? ?  ? ?Discharge Exam: ?Filed Weights  ? 03/20/22 1114 03/20/22 1600  ?Weight: 75.3 kg 70.9 kg  ? ?S: No acute complaints.  Hoping to go home today. ?Vitals:  ? 03/23/22 1254 03/23/22 2000 03/23/22 2140 03/24/22 0456  ?BP: 128/79  140/73 (!) 143/84  ?Pulse: 74  68 75  ?Resp: '17 18 16 16  '$ ?Temp: 97.9 ?F (36.6 ?C)  97.6 ?F (36.4 ?C) 98 ?F (36.7 ?C)  ?TempSrc: Oral  Oral Oral  ?SpO2: 97%  99% 99%  ?Weight:      ?Height:      ?  ?Physical Exam ?General: Alert and oriented x 3, NAD ?Cardiovascular: S1 S2 clear, RRR. No pedal edema b/l ?Respiratory: CTAB,  no wheezing, rales or rhonchi ?Gastrointestinal: Soft, nontender, nondistended, NBS ?Ext: no pedal edema bilaterally ?Neuro: no new deficits ?Skin: No rashes ?Psych: Normal affect and demeanor, alert and oriented x3  ? ? ?Condition at discharge: fair ? ?The results of significant diagnostics from this hospitalization (including imaging, microbiology, ancillary and laboratory) are listed below for reference.  ? ?Imaging Studies: ?ECHOCARDIOGRAM COMPLETE ? ?Result Date: 03/20/2022 ?   ECHOCARDIOGRAM REPORT   Patient Name:   Elyon KLEBER CREAN Date of Exam: 03/20/2022 Medical Rec #:  [id number]           Height:       68.0 in Accession #:    [id number]          Weight:       166.0 lb Date of Birth:  Nov 12, 1951           BSA:          1.888 m? Patient Age:    23 years            BP:           137/84 mmHg Patient Gender: M                   HR:           73 bpm. Exam Location:  Inpatient Procedure: 2D Echo, Cardiac Doppler and Color Doppler Indications:    Pulmonary Embolism   History:        Patient has no prior history of Echocardiogram examinations.                 Arrythmias:Atrial Fibrillation; Risk Factors:Sleep Apnea and                 Hypertension.  Sonographer:    Owens Corning

## 2022-03-24 NOTE — Plan of Care (Signed)

## 2022-03-24 NOTE — Progress Notes (Signed)
SATURATION QUALIFICATIONS: (This note is used to comply with regulatory documentation for home oxygen) ? ?Patient Saturations on Room Air at Rest = 98% ? ?Patient Saturations on Room Air while Ambulating = 94% ? ?Please briefly explain why patient needs home oxygen: ? ?Patient maintained saturations at 94% or above while ambulating on Room Air ?

## 2022-03-25 ENCOUNTER — Inpatient Hospital Stay: Payer: Medicare Other | Admitting: Hematology & Oncology

## 2022-03-25 ENCOUNTER — Inpatient Hospital Stay: Payer: Medicare Other

## 2022-03-25 DIAGNOSIS — G40909 Epilepsy, unspecified, not intractable, without status epilepticus: Secondary | ICD-10-CM | POA: Diagnosis not present

## 2022-03-25 DIAGNOSIS — N182 Chronic kidney disease, stage 2 (mild): Secondary | ICD-10-CM | POA: Diagnosis not present

## 2022-03-25 DIAGNOSIS — R262 Difficulty in walking, not elsewhere classified: Secondary | ICD-10-CM | POA: Diagnosis not present

## 2022-03-25 DIAGNOSIS — E440 Moderate protein-calorie malnutrition: Secondary | ICD-10-CM | POA: Diagnosis not present

## 2022-03-25 DIAGNOSIS — E785 Hyperlipidemia, unspecified: Secondary | ICD-10-CM | POA: Diagnosis not present

## 2022-03-25 DIAGNOSIS — Z6824 Body mass index (BMI) 24.0-24.9, adult: Secondary | ICD-10-CM | POA: Diagnosis not present

## 2022-03-25 DIAGNOSIS — I2699 Other pulmonary embolism without acute cor pulmonale: Secondary | ICD-10-CM | POA: Diagnosis not present

## 2022-03-25 DIAGNOSIS — Z9049 Acquired absence of other specified parts of digestive tract: Secondary | ICD-10-CM | POA: Diagnosis not present

## 2022-03-25 DIAGNOSIS — M810 Age-related osteoporosis without current pathological fracture: Secondary | ICD-10-CM | POA: Diagnosis not present

## 2022-03-25 DIAGNOSIS — C719 Malignant neoplasm of brain, unspecified: Secondary | ICD-10-CM | POA: Diagnosis not present

## 2022-03-26 DIAGNOSIS — N182 Chronic kidney disease, stage 2 (mild): Secondary | ICD-10-CM | POA: Diagnosis not present

## 2022-03-26 DIAGNOSIS — M810 Age-related osteoporosis without current pathological fracture: Secondary | ICD-10-CM | POA: Diagnosis not present

## 2022-03-26 DIAGNOSIS — E440 Moderate protein-calorie malnutrition: Secondary | ICD-10-CM | POA: Diagnosis not present

## 2022-03-26 DIAGNOSIS — C719 Malignant neoplasm of brain, unspecified: Secondary | ICD-10-CM | POA: Diagnosis not present

## 2022-03-26 DIAGNOSIS — E785 Hyperlipidemia, unspecified: Secondary | ICD-10-CM | POA: Diagnosis not present

## 2022-03-26 DIAGNOSIS — Z9049 Acquired absence of other specified parts of digestive tract: Secondary | ICD-10-CM | POA: Diagnosis not present

## 2022-03-27 ENCOUNTER — Other Ambulatory Visit (HOSPITAL_COMMUNITY): Payer: Self-pay

## 2022-03-28 DIAGNOSIS — C719 Malignant neoplasm of brain, unspecified: Secondary | ICD-10-CM | POA: Diagnosis not present

## 2022-03-28 DIAGNOSIS — Z9049 Acquired absence of other specified parts of digestive tract: Secondary | ICD-10-CM | POA: Diagnosis not present

## 2022-03-28 DIAGNOSIS — N182 Chronic kidney disease, stage 2 (mild): Secondary | ICD-10-CM | POA: Diagnosis not present

## 2022-03-28 DIAGNOSIS — E785 Hyperlipidemia, unspecified: Secondary | ICD-10-CM | POA: Diagnosis not present

## 2022-03-28 DIAGNOSIS — M810 Age-related osteoporosis without current pathological fracture: Secondary | ICD-10-CM | POA: Diagnosis not present

## 2022-03-28 DIAGNOSIS — E440 Moderate protein-calorie malnutrition: Secondary | ICD-10-CM | POA: Diagnosis not present

## 2022-03-31 ENCOUNTER — Other Ambulatory Visit (HOSPITAL_COMMUNITY): Payer: Self-pay

## 2022-03-31 DIAGNOSIS — Z9049 Acquired absence of other specified parts of digestive tract: Secondary | ICD-10-CM | POA: Diagnosis not present

## 2022-03-31 DIAGNOSIS — N182 Chronic kidney disease, stage 2 (mild): Secondary | ICD-10-CM | POA: Diagnosis not present

## 2022-03-31 DIAGNOSIS — E440 Moderate protein-calorie malnutrition: Secondary | ICD-10-CM | POA: Diagnosis not present

## 2022-03-31 DIAGNOSIS — C719 Malignant neoplasm of brain, unspecified: Secondary | ICD-10-CM | POA: Diagnosis not present

## 2022-03-31 DIAGNOSIS — M810 Age-related osteoporosis without current pathological fracture: Secondary | ICD-10-CM | POA: Diagnosis not present

## 2022-03-31 DIAGNOSIS — E785 Hyperlipidemia, unspecified: Secondary | ICD-10-CM | POA: Diagnosis not present

## 2022-04-01 DIAGNOSIS — E785 Hyperlipidemia, unspecified: Secondary | ICD-10-CM | POA: Diagnosis not present

## 2022-04-01 DIAGNOSIS — E440 Moderate protein-calorie malnutrition: Secondary | ICD-10-CM | POA: Diagnosis not present

## 2022-04-01 DIAGNOSIS — M810 Age-related osteoporosis without current pathological fracture: Secondary | ICD-10-CM | POA: Diagnosis not present

## 2022-04-01 DIAGNOSIS — C719 Malignant neoplasm of brain, unspecified: Secondary | ICD-10-CM | POA: Diagnosis not present

## 2022-04-01 DIAGNOSIS — N182 Chronic kidney disease, stage 2 (mild): Secondary | ICD-10-CM | POA: Diagnosis not present

## 2022-04-01 DIAGNOSIS — Z9049 Acquired absence of other specified parts of digestive tract: Secondary | ICD-10-CM | POA: Diagnosis not present

## 2022-04-03 ENCOUNTER — Inpatient Hospital Stay: Payer: Medicare Other

## 2022-04-03 ENCOUNTER — Other Ambulatory Visit: Payer: Self-pay

## 2022-04-03 ENCOUNTER — Inpatient Hospital Stay: Payer: Medicare Other | Attending: Internal Medicine | Admitting: Internal Medicine

## 2022-04-03 ENCOUNTER — Other Ambulatory Visit (HOSPITAL_COMMUNITY): Payer: Self-pay

## 2022-04-03 ENCOUNTER — Telehealth: Payer: Self-pay | Admitting: Internal Medicine

## 2022-04-03 VITALS — BP 123/54 | HR 68 | Temp 97.9°F | Resp 18 | Wt 159.4 lb

## 2022-04-03 DIAGNOSIS — Z7189 Other specified counseling: Secondary | ICD-10-CM | POA: Diagnosis not present

## 2022-04-03 DIAGNOSIS — C711 Malignant neoplasm of frontal lobe: Secondary | ICD-10-CM | POA: Diagnosis not present

## 2022-04-03 DIAGNOSIS — Z79899 Other long term (current) drug therapy: Secondary | ICD-10-CM | POA: Diagnosis not present

## 2022-04-03 DIAGNOSIS — R569 Unspecified convulsions: Secondary | ICD-10-CM

## 2022-04-03 LAB — CMP (CANCER CENTER ONLY)
ALT: 35 U/L (ref 0–44)
AST: 19 U/L (ref 15–41)
Albumin: 3.6 g/dL (ref 3.5–5.0)
Alkaline Phosphatase: 110 U/L (ref 38–126)
Anion gap: 7 (ref 5–15)
BUN: 22 mg/dL (ref 8–23)
CO2: 27 mmol/L (ref 22–32)
Calcium: 8.8 mg/dL — ABNORMAL LOW (ref 8.9–10.3)
Chloride: 109 mmol/L (ref 98–111)
Creatinine: 1.71 mg/dL — ABNORMAL HIGH (ref 0.61–1.24)
GFR, Estimated: 42 mL/min — ABNORMAL LOW (ref >60)
Glucose, Bld: 170 mg/dL — ABNORMAL HIGH (ref 70–99)
Potassium: 3.7 mmol/L (ref 3.5–5.1)
Sodium: 143 mmol/L (ref 135–145)
Total Bilirubin: 0.4 mg/dL (ref 0.3–1.2)
Total Protein: 6.4 g/dL — ABNORMAL LOW (ref 6.5–8.1)

## 2022-04-03 LAB — CBC WITH DIFFERENTIAL (CANCER CENTER ONLY)
Abs Immature Granulocytes: 0.1 10*3/uL — ABNORMAL HIGH (ref 0.00–0.07)
Basophils Absolute: 0.1 10*3/uL (ref 0.0–0.1)
Basophils Relative: 1 %
Eosinophils Absolute: 0 10*3/uL (ref 0.0–0.5)
Eosinophils Relative: 0 %
HCT: 40.2 % (ref 39.0–52.0)
Hemoglobin: 13.9 g/dL (ref 13.0–17.0)
Immature Granulocytes: 1 %
Lymphocytes Relative: 10 %
Lymphs Abs: 0.7 10*3/uL (ref 0.7–4.0)
MCH: 31.7 pg (ref 26.0–34.0)
MCHC: 34.6 g/dL (ref 30.0–36.0)
MCV: 91.8 fL (ref 80.0–100.0)
Monocytes Absolute: 0.3 10*3/uL (ref 0.1–1.0)
Monocytes Relative: 4 %
Neutro Abs: 6.1 10*3/uL (ref 1.7–7.7)
Neutrophils Relative %: 84 %
Platelet Count: 357 10*3/uL (ref 150–400)
RBC: 4.38 MIL/uL (ref 4.22–5.81)
RDW: 13.5 % (ref 11.5–15.5)
WBC Count: 7.3 10*3/uL (ref 4.0–10.5)
nRBC: 0 % (ref 0.0–0.2)

## 2022-04-03 MED ORDER — DEXAMETHASONE 1 MG PO TABS: 2.0000 mg | ORAL_TABLET | Freq: Every day | ORAL | 1 refills | Status: DC

## 2022-04-03 NOTE — Progress Notes (Signed)
Mathew Leach at [facility] [facility], Mathe[place] Le[place] [address] [phone]   Interval Evaluation  Date of Service: 04/03/22 Patient Name: Mathew Leach Patient MRN: [id number] Patient DOB: 1951/08/05 Provider: Ventura Sellers, MD  Identifying Statement:  Mathew Leach is a 71 y.o. male with left frontal glioblastoma   Referring Provider:  Oncologic History: Oncology History  Frontal glioblastoma multiforme ([place])  09/18/2020 Surgery   Left frontal craniotomy, resection by Dr. Kathyrn Sheriff; path demonstrates Glioblastaoma IDH-wt   10/17/2020 - 11/06/2020 Radiation Therapy   Completes abbreviated 15 fraction IMRT w/ concurrent Temodar      Chemotherapy   Patient is on Treatment Plan :  BRAIN GLIOBLASTOMA Post XRT Temozolomide Days 1-5 q28 Days      12/31/2021 Progression   Progression of disease.  Plan to resume 5-day Temodar.   02/18/2022 Progression   Progression of disease confirmed on 1 month f/u MRI   03/13/2022 -  Chemotherapy   Patient is on Treatment Plan : BRAIN Lomustine q42d        Biomarkers:  MGMT Unknown.  IDH 1/2 Wild type.  EGFR Unknown  TERT Unknown   Interval History: Mathew Leach presents to clinic today following recent admission for pulmonary embolism.  He feels improved back to baseline with regards to respiratory function.  No issues with the Eliquis.  CCNU was dosed on 4/92/01 without complication aside from VTE.  Currently on Keppra 1576m BID and Vimpat 1046mBID without further seizures.  No new or progressive neurologic deficits today aside from right sided weakness, which is static or improved. Otherwise remains mostly independent with gait, basic ADLs.  Decadron currently at 110m2maily.  H+P (10/04/20)  Mathew Phlegmesented to medical attention three weeks ago with new onset generalized seizure.  This occurred at work, unwitnessed, and then second event was witnessed by EMS described as  generalized shaking.  CNS imaging demonstrated left frontal mass lesion, which was resected by Dr. NunKathyrn Sheriff 09/18/20.  Following surgery, he had no clinical complaints.  Workup did also demonstrate mass within kidney possibly c/w renal cell carcinoma.  He presents today for path review; he is fully independent for age, lives with his wife.   Medications: Current Outpatient Medications on File Prior to Visit  Medication Sig Dispense Refill   amLODipine (NORVASC) 10 MG tablet Take 10 mg by mouth daily.     apixaban (ELIQUIS) 5 MG TABS tablet Take 1 tablet (5 mg total) by mouth 2 (two) times daily. START this prescription after you have completed the starter pack of apixaban. 60 tablet 3   APIXABAN (ELIQUIS) VTE STARTER PACK (10MG AND 5MG) Take as directed on package: start with two-5mg107mblets twice daily for 7 days. On day 8, switch to one-5mg 63mlet twice daily. 74 each 0   dexamethasone (DECADRON) 4 MG tablet Take 4 mg by mouth daily.     Lacosamide 100 MG TABS Take 1 tablet (100 mg total) by mouth 2 (two) times daily. 60 tablet 3   levETIRAcetam (KEPPRA) 750 MG tablet Take 2 tablets (1,500 mg total) by mouth 2 (two) times daily. 90 tablet 0   loperamide (IMODIUM) 2 MG capsule Take 2 mg by mouth daily as needed for diarrhea or loose stools.     LORazepam (ATIVAN) 1 MG tablet Take 1 tablet (1 mg total) by mouth every 8 (eight) hours as needed for seizure. 10 tablet 0   ondansetron (ZOFRAN) 8 MG tablet  Take 1 tablet (8 mg total) by mouth 2 (two) times daily as needed for nausea or vomiting. 30 tablet 1   simvastatin (ZOCOR) 20 MG tablet Take 1 tablet (20 mg total) by mouth daily. (Patient taking differently: Take 20 mg by mouth at bedtime.) 90 tablet 3   lomustine (GLEOSTINE) 100 MG capsule Take 200 mg by mouth as directed. Take on an emtpy stomach 1 hour before or 2 hours after meals. Caution:chemotherapy every 6 weeks (Patient not taking: Reported on 04/03/2022)     No current  facility-administered medications on file prior to visit.    Allergies:  Allergies  Allergen Reactions   Lipitor [Atorvastatin] Other (See Comments)    Caused DOUBLE VISION    Past Medical History:  Past Medical History:  Diagnosis Date   Actinic keratosis    scalp   Arthritis    ASD (atrial septal defect) 06/10/2015   Atrial flutter, paroxysmal ([place]) 08/01/2015   Cancer of right kidney ([place]) 06/05/2021   Chronic anticoagulation 08/29/2015   Chronic pain of right knee 12/07/2014   Dysplastic nevus 01/11/2015   upper back spinal   Dysplastic nevus 01/29/2017   right superior calf inferior to popliteal   Dysrhythmia    AFIB   Goals of care, counseling/discussion 09/17/2020   Hypertension    Kidney stones    Obstructive sleep apnea syndrome 06/26/2015   PAF (paroxysmal atrial fibrillation) ([place]) 07/30/2015   Pericarditis    1999   PFO (patent foramen ovale) 06/16/2018   Preoperative cardiovascular examination 02/03/2016   S/P TKR (total knee replacement) using cement, 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   Shortness of breath dyspnea    W/ EXERTION    Sleep apnea    CPAP   Stroke ([place])    04-2015   Past Surgical History:  Past Surgical History:  Procedure Laterality Date   APPLICATION OF CRANIAL NAVIGATION Left 09/18/2020   Procedure: APPLICATION OF CRANIAL NAVIGATION;  Surgeon: Consuella Lose, MD;  Location: [facility];  Service: Neurosurgery;  Laterality: Left;   COLONOSCOPY  04/10/2011   Moderate predominantly sigmoid diverticulosis. Small internal hemorrhoids.    COLONOSCOPY  01/30/2020   CRANIOTOMY Left 09/18/2020   Procedure: LEFT FRONTAL CRANIOTOMY FOR  TUMOR;  Surgeon: Consuella Lose, MD;  Location: [facility];  Service: Neurosurgery;  Laterality: Left;  left   CYST REMOVAL NECK     AGE 60   ESOPHAGOGASTRODUODENOSCOPY  04/10/2011   Erosive esophagitis with esophageal stricture (asymptomatic strictures since the patient not having any dysphagia). LA grade D esophagitis.   INGUINAL HERNIA  REPAIR     LEFT AS CHILD   ROBOTIC ASSITED PARTIAL NEPHRECTOMY Right 08/28/2021   Procedure: XI ROBOTIC ASSISTED PARTIAL NEPHRECTOMY AND RENAL CYST DECORTICATION WITH INTRAOPERTATIVE ULTRASOUND;  Surgeon: Alexis Frock, MD;  Location: [facility];  Service: Urology;  Laterality: Right;  3 HRS   TOTAL KNEE ARTHROPLASTY Right 03/17/2016   Procedure: TOTAL KNEE ARTHROPLASTY;  Surgeon: [name], MD;  Location: [facility];  Service: Orthopedics;  Laterality: Right;   watchman procedure  07/2019   no longer needs Eliquis   Social History:  Social History   Socioeconomic History   Marital status: Married    Spouse name: Not on file   Number of children: Not on file   Years of education: Not on file   Highest education level: Not on file  Occupational History   Not on file  Tobacco Use   Smoking status: Never   Smokeless tobacco: Never  Vaping Use  Vaping Use: Never used  Substance and Sexual Activity   Alcohol use: No   Drug use: No   Sexual activity: Not on file  Other Topics Concern   Not on file  Social History Narrative   Not on file   Social Determinants of Health   Financial Resource Strain: Not on file  Food Insecurity: Not on file  Transportation Needs: Not on file  Physical Activity: Not on file  Stress: Not on file  Social Connections: Not on file  Intimate Partner Violence: Not on file   Family History:  Family History  Problem Relation Age of Onset   Lung cancer Father    Colon cancer Neg Hx    Esophageal cancer Neg Hx    Colon polyps Neg Hx    Rectal cancer Neg Hx    Stomach cancer Neg Hx     Review of Systems: Constitutional: Doesn't report fevers, chills or abnormal weight loss Eyes: Doesn't report blurriness of vision Ears, nose, mouth, throat, and face: Doesn't report sore throat Respiratory: Doesn't report cough, dyspnea or wheezes Cardiovascular: Doesn't report palpitation, chest discomfort  Gastrointestinal:  Doesn't report nausea, constipation,  diarrhea GU: Doesn't report incontinence Skin: Doesn't report skin rashes Neurological: Per HPI Musculoskeletal: Doesn't report joint pain Behavioral/Psych: Doesn't report anxiety  Physical Exam: Vitals:   04/03/22 1120  BP: (!) 123/54  Pulse: 68  Resp: 18  Temp: 97.9 F (36.6 C)  SpO2: 100%   KPS: 70. General: Alert, cooperative, pleasant, in no acute distress Head: Normal EENT: No conjunctival injection or scleral icterus.  Lungs: Resp effort normal Cardiac: Regular rate Abdomen: Non-distended abdomen Skin: No rashes cyanosis or petechiae. Extremities: No clubbing or edema  Neurologic Exam: Mental Status: Awake, alert, attentive to examiner. Oriented to self and environment. Language is fluent with intact comprehension.  Cranial Nerves: Visual acuity is grossly normal. Visual fields are full. Extra-ocular movements intact. No ptosis. Face is symmetric Motor: Tone and bulk are normal. Power is 4+/5 in right arm and leg. Reflexes are symmetric, no pathologic reflexes present.  Sensory: Intact to light touch Gait: Mildly dystaxic   Labs: I have reviewed the data as listed    Component Value Date/Time   NA 143 04/03/2022 1038   NA 143 02/13/2020 1037   K 3.7 04/03/2022 1038   CL 109 04/03/2022 1038   CO2 27 04/03/2022 1038   GLUCOSE 170 (H) 04/03/2022 1038   BUN 22 04/03/2022 1038   BUN 13 02/13/2020 1037   CREATININE 1.71 (H) 04/03/2022 1038   CALCIUM 8.8 (L) 04/03/2022 1038   PROT 6.4 (L) 04/03/2022 1038   PROT 6.7 02/13/2020 1037   ALBUMIN 3.6 04/03/2022 1038   ALBUMIN 4.3 02/13/2020 1037   AST 19 04/03/2022 1038   ALT 35 04/03/2022 1038   ALKPHOS 110 04/03/2022 1038   BILITOT 0.4 04/03/2022 1038   GFRNONAA 42 (L) 04/03/2022 6333   GFRAA DUPLICATE 54/56/2563 8937   Lab Results  Component Value Date   WBC 7.3 04/03/2022   NEUTROABS 6.1 04/03/2022   HGB 13.9 04/03/2022   HCT 40.2 04/03/2022   MCV 91.8 04/03/2022   PLT 357 04/03/2022      Assessment/Plan Frontal glioblastoma multiforme (HCC) [C71.1]  [name] is clinically back to baseline today after recent VTE event.  He is doing well with Eliquis at this time.  CCNU cycle #1 was dosed 03/16/22.  Labs are notable only for bump in creatinine today.   Patient elected to continue  with cycle #1 oral CCNU 158m/m2 q6 weeks.  Will defer avastin due to moderate burden of enhancing disease, relative lack of symptoms.  We reviewed side effects of CCNU, including fatigue, nausea vomiting, cytopenias, ILD.  The patient will have a complete blood count, a comprehensive metabolic panel, and urine protein performed prior to each avastin infusion. Labs may need to be performed more often. Zofran will prescribed for home use for nausea/vomiting.   Chemotherapy should be held for the following:  ANC less than 1,000  Platelets less than 100,000  LFT or creatinine greater than 2x ULN  If clinical concerns/contraindications develop   Decadron should decrease to 260mdaily if tolerated.   Keppra will continue at 150038mID, Vimpat 100m26mD.   Renal cell carcinoma will con't to be monitored by Dr. EnneMarin Olprrent plan is for observation only.    We ask that DaviGardenia Phlegmurn to clinic in 3 weeks following MRI brain for evaluation, prior to cycle #2.  All questions were answered. The patient knows to call the clinic with any problems, questions or concerns. No barriers to learning were detected.  I have spent a total of 30 minutes of face-to-face and non-face-to-face time, excluding clinical staff time, preparing to see patient, ordering tests and/or medications, counseling the patient, and independently interpreting results and communicating results to the patient/family/caregiver    ZachVentura Sellers Medical Director of Neuro-Oncology [facility]18/23 11:39 AM

## 2022-04-03 NOTE — Telephone Encounter (Signed)
Per 5/18 los called and spoke to pt wife about appointments.  Pt wife confirmed appointment

## 2022-04-04 ENCOUNTER — Other Ambulatory Visit (HOSPITAL_COMMUNITY): Payer: Self-pay

## 2022-04-04 DIAGNOSIS — M810 Age-related osteoporosis without current pathological fracture: Secondary | ICD-10-CM | POA: Diagnosis not present

## 2022-04-04 DIAGNOSIS — E440 Moderate protein-calorie malnutrition: Secondary | ICD-10-CM | POA: Diagnosis not present

## 2022-04-04 DIAGNOSIS — Z9049 Acquired absence of other specified parts of digestive tract: Secondary | ICD-10-CM | POA: Diagnosis not present

## 2022-04-04 DIAGNOSIS — N182 Chronic kidney disease, stage 2 (mild): Secondary | ICD-10-CM | POA: Diagnosis not present

## 2022-04-04 DIAGNOSIS — E785 Hyperlipidemia, unspecified: Secondary | ICD-10-CM | POA: Diagnosis not present

## 2022-04-04 DIAGNOSIS — C719 Malignant neoplasm of brain, unspecified: Secondary | ICD-10-CM | POA: Diagnosis not present

## 2022-04-07 ENCOUNTER — Other Ambulatory Visit: Payer: Self-pay | Admitting: Internal Medicine

## 2022-04-07 DIAGNOSIS — Z789 Other specified health status: Secondary | ICD-10-CM | POA: Diagnosis not present

## 2022-04-07 DIAGNOSIS — C719 Malignant neoplasm of brain, unspecified: Secondary | ICD-10-CM | POA: Diagnosis not present

## 2022-04-07 DIAGNOSIS — G40909 Epilepsy, unspecified, not intractable, without status epilepticus: Secondary | ICD-10-CM | POA: Diagnosis not present

## 2022-04-07 DIAGNOSIS — I2699 Other pulmonary embolism without acute cor pulmonale: Secondary | ICD-10-CM | POA: Diagnosis not present

## 2022-04-07 MED ORDER — LACOSAMIDE 100 MG PO TABS
100.0000 mg | ORAL_TABLET | Freq: Two times a day (BID) | ORAL | 3 refills | Status: DC
Start: 2022-04-07 — End: 2022-08-04

## 2022-04-08 ENCOUNTER — Telehealth (HOSPITAL_BASED_OUTPATIENT_CLINIC_OR_DEPARTMENT_OTHER): Payer: Self-pay

## 2022-04-08 ENCOUNTER — Other Ambulatory Visit (HOSPITAL_BASED_OUTPATIENT_CLINIC_OR_DEPARTMENT_OTHER): Payer: Self-pay

## 2022-04-08 DIAGNOSIS — M810 Age-related osteoporosis without current pathological fracture: Secondary | ICD-10-CM | POA: Diagnosis not present

## 2022-04-08 DIAGNOSIS — C719 Malignant neoplasm of brain, unspecified: Secondary | ICD-10-CM | POA: Diagnosis not present

## 2022-04-08 DIAGNOSIS — E440 Moderate protein-calorie malnutrition: Secondary | ICD-10-CM | POA: Diagnosis not present

## 2022-04-08 DIAGNOSIS — E785 Hyperlipidemia, unspecified: Secondary | ICD-10-CM | POA: Diagnosis not present

## 2022-04-08 DIAGNOSIS — Z9049 Acquired absence of other specified parts of digestive tract: Secondary | ICD-10-CM | POA: Diagnosis not present

## 2022-04-08 DIAGNOSIS — N182 Chronic kidney disease, stage 2 (mild): Secondary | ICD-10-CM | POA: Diagnosis not present

## 2022-04-08 NOTE — Telephone Encounter (Signed)
Pharmacy Transitions of Care Follow-up Telephone Call  Date of discharge: 03/24/22  Discharge Diagnosis: PE/PAF  How have you been since you were released from the hospital? Patient was taking a nap, but the wife said that he has been doing well since leaving the hospital.and had no questions or concerns on his medications. She is aware of symptoms of bleeding and DDIs to avoid. Given callback number if the patient has any questions.    Medication changes made at discharge: START taking these medications  START taking these medications  * Eliquis DVT/PE Starter Pack Generic drug: Apixaban Starter Pack ('10mg'$  and '5mg'$ ) Take as directed on package: start with two-'5mg'$  tablets twice daily for 7 days. On day 8, switch to one-'5mg'$  tablet twice daily.  * Eliquis 5 MG Tabs tablet Generic drug: apixaban Take 1 tablet (5 mg total) by mouth 2 (two) times daily. START this prescription after you have completed the starter pack of apixaban.   very important  * This list has 2 medication(s) that are the same as other medications prescribed for you. Read the directions carefully, and ask your doctor or other care provider to review them with you.   CHANGE how you take these medications  CHANGE how you take these medications  simvastatin 20 MG tablet Commonly known as: ZOCOR Take 1 tablet (20 mg total) by mouth daily. What changed: when to take this   CONTINUE taking these medications  CONTINUE taking these medications  amLODipine 10 MG tablet Commonly known as: NORVASC  levETIRAcetam 750 MG tablet Commonly known as: KEPPRA Take 2 tablets (1,500 mg total) by mouth 2 (two) times daily.  lomustine 100 MG capsule Commonly known as: GLEOSTINE  loperamide 2 MG capsule Commonly known as: IMODIUM  LORazepam 1 MG tablet Commonly known as: Ativan Take 1 tablet (1 mg total) by mouth every 8 (eight) hours as needed for seizure.  ondansetron 8 MG tablet Commonly known as: Zofran Take 1 tablet (8 mg  total) by mouth 2 (two) times daily as needed for nausea or vomiting.   STOP taking these medications  STOP taking these medications  phenytoin 100 MG ER capsule Commonly known as: DILANTIN    Medication changes verified by the patient? yes    Medication Accessibility:  [facility]   Was the patient provided with refills on discharged medications? Yes   Have all prescriptions been transferred from [facility] to home pharmacy? Yes   Is the patient able to afford medications? insured Notable copays: $38 Eligible patient assistance: no    Medication Review: APIXABAN (ELIQUIS)  Apixaban 10 mg BID initiated on 03/24/22. Will switch to apixaban 5 mg BID after 7 days (DATE 04/01/22).  - Discussed importance of taking medication around the same time everyday  - Reviewed potential DDIs with patient  - Advised patient of medications to avoid (NSAIDs, ASA)  - Educated that Tylenol (acetaminophen) will be the preferred analgesic to prevent risk of bleeding  - Emphasized importance of monitoring for signs and symptoms of bleeding (abnormal bruising, prolonged bleeding, nose bleeds, bleeding from gums, discolored urine, black tarry stools)  - Advised patient to alert all providers of anticoagulation therapy prior to starting a new medication or having a procedure   Follow-up Appointments: Date Visit Type Length Department    04/25/2022 12:00 PM MR BRAIN W WO CONTRAST 60 min [facility] [[id number]]  Patient Instructions:   Please arrive 15 minutes prior to your appointment time.  04/29/2022 11:30 AM LAB MO 15 min [facility] [[id number]]  Patient Instructions:            04/29/2022 12:00 PM EST PT 30 30 min [facility] [[id number]]    If their condition worsens, is the pt aware to call PCP or go to the Emergency Dept.? yes  Final Patient Assessment: Patient was taking a nap, but the wife said that he  has been doing well since leaving the hospital.and had no questions or concerns on his medications. She is aware of symptoms of bleeding and DDIs to avoid. Given callback number if the patient has any questions.    Darcus Austin, [place] 04/08/2022 3:44 PM

## 2022-04-09 DIAGNOSIS — M810 Age-related osteoporosis without current pathological fracture: Secondary | ICD-10-CM | POA: Diagnosis not present

## 2022-04-09 DIAGNOSIS — Z9049 Acquired absence of other specified parts of digestive tract: Secondary | ICD-10-CM | POA: Diagnosis not present

## 2022-04-09 DIAGNOSIS — N182 Chronic kidney disease, stage 2 (mild): Secondary | ICD-10-CM | POA: Diagnosis not present

## 2022-04-09 DIAGNOSIS — E440 Moderate protein-calorie malnutrition: Secondary | ICD-10-CM | POA: Diagnosis not present

## 2022-04-09 DIAGNOSIS — C719 Malignant neoplasm of brain, unspecified: Secondary | ICD-10-CM | POA: Diagnosis not present

## 2022-04-09 DIAGNOSIS — E785 Hyperlipidemia, unspecified: Secondary | ICD-10-CM | POA: Diagnosis not present

## 2022-04-10 ENCOUNTER — Other Ambulatory Visit: Payer: Self-pay | Admitting: Radiation Therapy

## 2022-04-10 DIAGNOSIS — Z9049 Acquired absence of other specified parts of digestive tract: Secondary | ICD-10-CM | POA: Diagnosis not present

## 2022-04-10 DIAGNOSIS — C719 Malignant neoplasm of brain, unspecified: Secondary | ICD-10-CM | POA: Diagnosis not present

## 2022-04-10 DIAGNOSIS — N182 Chronic kidney disease, stage 2 (mild): Secondary | ICD-10-CM | POA: Diagnosis not present

## 2022-04-10 DIAGNOSIS — E785 Hyperlipidemia, unspecified: Secondary | ICD-10-CM | POA: Diagnosis not present

## 2022-04-10 DIAGNOSIS — M810 Age-related osteoporosis without current pathological fracture: Secondary | ICD-10-CM | POA: Diagnosis not present

## 2022-04-10 DIAGNOSIS — E440 Moderate protein-calorie malnutrition: Secondary | ICD-10-CM | POA: Diagnosis not present

## 2022-04-16 DIAGNOSIS — Z85528 Personal history of other malignant neoplasm of kidney: Secondary | ICD-10-CM | POA: Diagnosis not present

## 2022-04-16 DIAGNOSIS — N200 Calculus of kidney: Secondary | ICD-10-CM | POA: Diagnosis not present

## 2022-04-16 DIAGNOSIS — D49511 Neoplasm of unspecified behavior of right kidney: Secondary | ICD-10-CM | POA: Diagnosis not present

## 2022-04-16 DIAGNOSIS — N281 Cyst of kidney, acquired: Secondary | ICD-10-CM | POA: Diagnosis not present

## 2022-04-16 DIAGNOSIS — K5730 Diverticulosis of large intestine without perforation or abscess without bleeding: Secondary | ICD-10-CM | POA: Diagnosis not present

## 2022-04-17 DIAGNOSIS — I480 Paroxysmal atrial fibrillation: Secondary | ICD-10-CM | POA: Diagnosis not present

## 2022-04-17 DIAGNOSIS — Z7901 Long term (current) use of anticoagulants: Secondary | ICD-10-CM | POA: Diagnosis not present

## 2022-04-17 DIAGNOSIS — E440 Moderate protein-calorie malnutrition: Secondary | ICD-10-CM | POA: Diagnosis not present

## 2022-04-17 DIAGNOSIS — K222 Esophageal obstruction: Secondary | ICD-10-CM | POA: Diagnosis not present

## 2022-04-17 DIAGNOSIS — I1310 Hypertensive heart and chronic kidney disease without heart failure, with stage 1 through stage 4 chronic kidney disease, or unspecified chronic kidney disease: Secondary | ICD-10-CM | POA: Diagnosis not present

## 2022-04-17 DIAGNOSIS — I700 Atherosclerosis of aorta: Secondary | ICD-10-CM | POA: Diagnosis not present

## 2022-04-17 DIAGNOSIS — G40803 Other epilepsy, intractable, with status epilepticus: Secondary | ICD-10-CM | POA: Diagnosis not present

## 2022-04-17 DIAGNOSIS — E785 Hyperlipidemia, unspecified: Secondary | ICD-10-CM | POA: Diagnosis not present

## 2022-04-17 DIAGNOSIS — Z9181 History of falling: Secondary | ICD-10-CM | POA: Diagnosis not present

## 2022-04-17 DIAGNOSIS — C641 Malignant neoplasm of right kidney, except renal pelvis: Secondary | ICD-10-CM | POA: Diagnosis not present

## 2022-04-17 DIAGNOSIS — C719 Malignant neoplasm of brain, unspecified: Secondary | ICD-10-CM | POA: Diagnosis not present

## 2022-04-17 DIAGNOSIS — I2609 Other pulmonary embolism with acute cor pulmonale: Secondary | ICD-10-CM | POA: Diagnosis not present

## 2022-04-17 DIAGNOSIS — N182 Chronic kidney disease, stage 2 (mild): Secondary | ICD-10-CM | POA: Diagnosis not present

## 2022-04-17 DIAGNOSIS — K225 Diverticulum of esophagus, acquired: Secondary | ICD-10-CM | POA: Diagnosis not present

## 2022-04-17 DIAGNOSIS — G4733 Obstructive sleep apnea (adult) (pediatric): Secondary | ICD-10-CM | POA: Diagnosis not present

## 2022-04-17 DIAGNOSIS — Z8673 Personal history of transient ischemic attack (TIA), and cerebral infarction without residual deficits: Secondary | ICD-10-CM | POA: Diagnosis not present

## 2022-04-17 DIAGNOSIS — I2510 Atherosclerotic heart disease of native coronary artery without angina pectoris: Secondary | ICD-10-CM | POA: Diagnosis not present

## 2022-04-17 DIAGNOSIS — J189 Pneumonia, unspecified organism: Secondary | ICD-10-CM | POA: Diagnosis not present

## 2022-04-17 DIAGNOSIS — I2699 Other pulmonary embolism without acute cor pulmonale: Secondary | ICD-10-CM | POA: Diagnosis not present

## 2022-04-17 DIAGNOSIS — Z9049 Acquired absence of other specified parts of digestive tract: Secondary | ICD-10-CM | POA: Diagnosis not present

## 2022-04-17 DIAGNOSIS — J9601 Acute respiratory failure with hypoxia: Secondary | ICD-10-CM | POA: Diagnosis not present

## 2022-04-17 DIAGNOSIS — C711 Malignant neoplasm of frontal lobe: Secondary | ICD-10-CM | POA: Diagnosis not present

## 2022-04-17 DIAGNOSIS — M810 Age-related osteoporosis without current pathological fracture: Secondary | ICD-10-CM | POA: Diagnosis not present

## 2022-04-17 DIAGNOSIS — K449 Diaphragmatic hernia without obstruction or gangrene: Secondary | ICD-10-CM | POA: Diagnosis not present

## 2022-04-17 DIAGNOSIS — F840 Autistic disorder: Secondary | ICD-10-CM | POA: Diagnosis not present

## 2022-04-17 DIAGNOSIS — M1990 Unspecified osteoarthritis, unspecified site: Secondary | ICD-10-CM | POA: Diagnosis not present

## 2022-04-17 DIAGNOSIS — K219 Gastro-esophageal reflux disease without esophagitis: Secondary | ICD-10-CM | POA: Diagnosis not present

## 2022-04-18 ENCOUNTER — Telehealth: Payer: Self-pay

## 2022-04-18 DIAGNOSIS — G40803 Other epilepsy, intractable, with status epilepticus: Secondary | ICD-10-CM | POA: Diagnosis not present

## 2022-04-18 DIAGNOSIS — I2609 Other pulmonary embolism with acute cor pulmonale: Secondary | ICD-10-CM | POA: Diagnosis not present

## 2022-04-18 DIAGNOSIS — C711 Malignant neoplasm of frontal lobe: Secondary | ICD-10-CM | POA: Diagnosis not present

## 2022-04-18 DIAGNOSIS — I480 Paroxysmal atrial fibrillation: Secondary | ICD-10-CM | POA: Diagnosis not present

## 2022-04-18 DIAGNOSIS — I1310 Hypertensive heart and chronic kidney disease without heart failure, with stage 1 through stage 4 chronic kidney disease, or unspecified chronic kidney disease: Secondary | ICD-10-CM | POA: Diagnosis not present

## 2022-04-18 DIAGNOSIS — N182 Chronic kidney disease, stage 2 (mild): Secondary | ICD-10-CM | POA: Diagnosis not present

## 2022-04-18 NOTE — Telephone Encounter (Signed)
This nurse received a call from [facility] nurse stating that OT needs a verbal order for a one time visit to assess patient for some equipment.  This nurse forwarded request to provider.

## 2022-04-18 NOTE — Telephone Encounter (Signed)
This nurse left a message for [facility] health nurse giving a verbal order per provider for patient to have a 1 week, 1 visit from Occupational Therapy to be assessed for equipment.  No further concerns at this time.

## 2022-04-21 DIAGNOSIS — C651 Malignant neoplasm of right renal pelvis: Secondary | ICD-10-CM | POA: Diagnosis not present

## 2022-04-21 DIAGNOSIS — R911 Solitary pulmonary nodule: Secondary | ICD-10-CM | POA: Diagnosis not present

## 2022-04-21 DIAGNOSIS — N3942 Incontinence without sensory awareness: Secondary | ICD-10-CM | POA: Diagnosis not present

## 2022-04-21 DIAGNOSIS — D49511 Neoplasm of unspecified behavior of right kidney: Secondary | ICD-10-CM | POA: Diagnosis not present

## 2022-04-22 DIAGNOSIS — C711 Malignant neoplasm of frontal lobe: Secondary | ICD-10-CM | POA: Diagnosis not present

## 2022-04-22 DIAGNOSIS — N182 Chronic kidney disease, stage 2 (mild): Secondary | ICD-10-CM | POA: Diagnosis not present

## 2022-04-22 DIAGNOSIS — I2609 Other pulmonary embolism with acute cor pulmonale: Secondary | ICD-10-CM | POA: Diagnosis not present

## 2022-04-22 DIAGNOSIS — I480 Paroxysmal atrial fibrillation: Secondary | ICD-10-CM | POA: Diagnosis not present

## 2022-04-22 DIAGNOSIS — I1310 Hypertensive heart and chronic kidney disease without heart failure, with stage 1 through stage 4 chronic kidney disease, or unspecified chronic kidney disease: Secondary | ICD-10-CM | POA: Diagnosis not present

## 2022-04-22 DIAGNOSIS — G40803 Other epilepsy, intractable, with status epilepticus: Secondary | ICD-10-CM | POA: Diagnosis not present

## 2022-04-23 ENCOUNTER — Telehealth: Payer: Self-pay

## 2022-04-23 NOTE — Telephone Encounter (Signed)
Corene Cornea states that Mr. Stockham wants to hold physical therapy appointments  until after his MRI on Friday 04-25-22.  He was scheduled for 2 appointments this week and 2 for next week. Corene Cornea calling to notify Dr. Mickeal Skinner.

## 2022-04-25 ENCOUNTER — Ambulatory Visit (HOSPITAL_COMMUNITY)
Admission: RE | Admit: 2022-04-25 | Discharge: 2022-04-25 | Disposition: A | Discharge status: Home / Self Care | Payer: Medicare Other | Source: Ambulatory Visit | Attending: Internal Medicine | Admitting: Internal Medicine

## 2022-04-25 ENCOUNTER — Other Ambulatory Visit (HOSPITAL_COMMUNITY): Payer: Self-pay

## 2022-04-25 DIAGNOSIS — C711 Malignant neoplasm of frontal lobe: Secondary | ICD-10-CM | POA: Insufficient documentation

## 2022-04-25 DIAGNOSIS — C719 Malignant neoplasm of brain, unspecified: Secondary | ICD-10-CM | POA: Diagnosis not present

## 2022-04-25 MED ORDER — GADOBUTROL 1 MMOL/ML IV SOLN
7.0000 mL | Freq: Once | INTRAVENOUS | Status: AC | PRN
  Administered 2022-04-25: 7 mL via INTRAVENOUS

## 2022-04-28 ENCOUNTER — Inpatient Hospital Stay: Payer: Medicare Other | Attending: Internal Medicine

## 2022-04-28 DIAGNOSIS — C711 Malignant neoplasm of frontal lobe: Secondary | ICD-10-CM | POA: Insufficient documentation

## 2022-04-28 DIAGNOSIS — Z5112 Encounter for antineoplastic immunotherapy: Secondary | ICD-10-CM | POA: Insufficient documentation

## 2022-04-29 ENCOUNTER — Inpatient Hospital Stay (HOSPITAL_BASED_OUTPATIENT_CLINIC_OR_DEPARTMENT_OTHER): Payer: Medicare Other | Admitting: Internal Medicine

## 2022-04-29 ENCOUNTER — Telehealth: Payer: Self-pay | Admitting: Pharmacy Technician

## 2022-04-29 ENCOUNTER — Telehealth: Payer: Self-pay | Admitting: Pharmacist

## 2022-04-29 ENCOUNTER — Other Ambulatory Visit: Payer: Self-pay

## 2022-04-29 ENCOUNTER — Other Ambulatory Visit (HOSPITAL_COMMUNITY): Payer: Self-pay

## 2022-04-29 ENCOUNTER — Ambulatory Visit: Payer: Medicare Other | Admitting: Hematology and Oncology

## 2022-04-29 ENCOUNTER — Inpatient Hospital Stay: Payer: Medicare Other

## 2022-04-29 ENCOUNTER — Other Ambulatory Visit: Payer: Medicare Other

## 2022-04-29 VITALS — BP 130/73 | HR 71 | Temp 97.5°F | Resp 18 | Wt 157.8 lb

## 2022-04-29 DIAGNOSIS — C711 Malignant neoplasm of frontal lobe: Secondary | ICD-10-CM | POA: Diagnosis not present

## 2022-04-29 DIAGNOSIS — Z5112 Encounter for antineoplastic immunotherapy: Secondary | ICD-10-CM | POA: Diagnosis not present

## 2022-04-29 LAB — CBC WITH DIFFERENTIAL (CANCER CENTER ONLY)
Abs Immature Granulocytes: 0.04 10*3/uL (ref 0.00–0.07)
Basophils Absolute: 0 10*3/uL (ref 0.0–0.1)
Basophils Relative: 1 %
Eosinophils Absolute: 0.1 10*3/uL (ref 0.0–0.5)
Eosinophils Relative: 1 %
HCT: 41.1 % (ref 39.0–52.0)
Hemoglobin: 14 g/dL (ref 13.0–17.0)
Immature Granulocytes: 1 %
Lymphocytes Relative: 18 %
Lymphs Abs: 1 10*3/uL (ref 0.7–4.0)
MCH: 31.8 pg (ref 26.0–34.0)
MCHC: 34.1 g/dL (ref 30.0–36.0)
MCV: 93.4 fL (ref 80.0–100.0)
Monocytes Absolute: 0.3 10*3/uL (ref 0.1–1.0)
Monocytes Relative: 5 %
Neutro Abs: 3.9 10*3/uL (ref 1.7–7.7)
Neutrophils Relative %: 74 %
Platelet Count: 214 10*3/uL (ref 150–400)
RBC: 4.4 MIL/uL (ref 4.22–5.81)
RDW: 13.3 % (ref 11.5–15.5)
WBC Count: 5.3 10*3/uL (ref 4.0–10.5)
nRBC: 0 % (ref 0.0–0.2)

## 2022-04-29 LAB — CMP (CANCER CENTER ONLY)
ALT: 24 U/L (ref 0–44)
AST: 13 U/L — ABNORMAL LOW (ref 15–41)
Albumin: 3.8 g/dL (ref 3.5–5.0)
Alkaline Phosphatase: 67 U/L (ref 38–126)
Anion gap: 5 (ref 5–15)
BUN: 21 mg/dL (ref 8–23)
CO2: 29 mmol/L (ref 22–32)
Calcium: 9.2 mg/dL (ref 8.9–10.3)
Chloride: 110 mmol/L (ref 98–111)
Creatinine: 1.44 mg/dL — ABNORMAL HIGH (ref 0.61–1.24)
GFR, Estimated: 51 mL/min — ABNORMAL LOW (ref >60)
Glucose, Bld: 127 mg/dL — ABNORMAL HIGH (ref 70–99)
Potassium: 3.7 mmol/L (ref 3.5–5.1)
Sodium: 144 mmol/L (ref 135–145)
Total Bilirubin: 0.5 mg/dL (ref 0.3–1.2)
Total Protein: 6.4 g/dL — ABNORMAL LOW (ref 6.5–8.1)

## 2022-04-29 MED ORDER — ONDANSETRON HCL 8 MG PO TABS
8.0000 mg | ORAL_TABLET | Freq: Two times a day (BID) | ORAL | 1 refills | Status: AC | PRN
  Filled 2022-04-29: qty 30, 15d supply, fill #0

## 2022-04-29 MED ORDER — TEMOZOLOMIDE 100 MG PO CAPS
100.0000 mg | ORAL_CAPSULE | Freq: Every day | ORAL | 0 refills | Status: DC
  Filled 2022-04-29 – 2022-04-30 (×2): qty 28, 28d supply, fill #0

## 2022-04-29 NOTE — Progress Notes (Signed)
DISCONTINUE ON PATHWAY REGIMEN - Neuro     A cycle is every 42 days:     Lomustine   **Always confirm dose/schedule in your pharmacy ordering system**  REASON: Disease Progression PRIOR TREATMENT: [id number]: Lomustine 110 mg/m2 D1 q42 Days TREATMENT RESPONSE: Progressive Disease (PD)  START ON PATHWAY REGIMEN - Neuro     A cycle is every 28 days:     Temozolomide      Temozolomide   **Always confirm dose/schedule in your pharmacy ordering system**  Patient Characteristics: Glioma, Glioblastoma, IDH-wildtype, Recurrent or Progressive, Nonsurgical Candidate, Systemic Therapy Candidate, BRAF V600E Mutation Negative/Unknown and NTRK Fusion Negative/Unknown Disease Classification: Glioma Disease Classification: Glioblastoma, IDH-wildtype Disease Status: Recurrent or Progressive Treatment Classification: Nonsurgical Candidate Treatment (Nonsurgical/Adjuvant): Systemic Therapy Candidate NTRK Gene Fusion Status: Negative BRAF V600E Mutation Status: Negative Intent of Therapy: Non-Curative / Palliative Intent, Discussed with Patient

## 2022-04-29 NOTE — Telephone Encounter (Signed)
Oral Oncology Pharmacist Encounter  Received new prescription for Temodar (temozolomide) for the treatment of glioblastoma in conjunction with bevacizumab, planned duration until disease progression or unacceptable drug toxicity.  CBC w/ Diff and CMP from 04/29/22 assessed, noted patient with Scr 1.44 mg/dL (CrCl ~46.3 mL/min) - no baseline dose adjustments required. Prescription dose and frequency assessed for appropriateness.   Current medication list in Epic reviewed, no relevant/significant DDIs with Temodar identified.  Evaluated chart and no patient barriers to medication adherence noted.   Prescription has been e-scribed to the [facility] for benefits analysis and approval.  Oral Oncology Clinic will continue to follow for insurance authorization, copayment issues, initial counseling and start date.  Leron Croak, PharmD, BCPS Hematology/Oncology Clinical Pharmacist [facility] [place] [phone] 04/29/2022 3:15 PM

## 2022-04-29 NOTE — Telephone Encounter (Signed)
Oral Oncology Patient Advocate Encounter  After completing a benefits investigation, prior authorization for Temozolomide (Temodar) is not required at this time through [employer].  Patient's copay is $14.     Mathew Leach, CPhT-Adv Pharmacy Patient Advocate Specialist [facility] Patient Advocate Team Direct Number: [phone]  Fax: [phone]

## 2022-04-29 NOTE — Progress Notes (Signed)
[name], [place] [address] [phone]   Interval Evaluation  Date of Service: 04/29/22 Patient Name: Mathew Leach Patient MRN: [id number] Patient DOB: 11-May-1951 Provider: Ventura Sellers, MD  Identifying Statement:  Mathew Leach is a 71 y.o. 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 Kathyrn Sheriff; path demonstrates Glioblastaoma IDH-wt   10/17/2020 - 11/06/2020 Radiation Therapy   Completes abbreviated 15 fraction IMRT w/ concurrent Temodar      Chemotherapy   Patient is on Treatment Plan :  BRAIN GLIOBLASTOMA Post XRT Temozolomide Days 1-5 q28 Days      12/31/2021 Progression   Progression of disease.  Plan to resume 5-day Temodar.   02/18/2022 Progression   Progression of disease confirmed on 1 month f/u MRI   03/13/2022 - 03/13/2022 Chemotherapy   Patient is on Treatment Plan : BRAIN [place] q42d     05/04/2022 -  Chemotherapy   Patient is on Treatment Plan : BRAIN GLIOBLASTOMA Consolidation Temozolomide Days 1-5 q28 Days      05/06/2022 -  Chemotherapy   Patient is on Treatment Plan : BRAIN GBM Bevacizumab 14d x 6 cycles       Biomarkers:  MGMT Unknown.  IDH 1/2 Wild type.  EGFR Unknown  TERT Unknown   Interval History: Mathew Leach presents today following recent MRI brain.  Wife notes decline in functional status today.  Left sided weakness, clumsiness is increased, he is walking around the home but not outside it.  In a wheelchair today.  He has bout of confusion, and has had difficulty finding words.  Short term memory has declined. Currently on Keppra 1560m BID and Vimpat 10220mBID without further seizures.  Decadron currently at 20m320maily.  H+P (10/04/20)  Mathew Leach to medical attention three weeks  ago with new onset generalized seizure.  This occurred at work, unwitnessed, and then second event was witnessed by EMS described as generalized shaking.  CNS imaging demonstrated left frontal mass lesion, which was resected by Dr. NunKathyrn Sheriff 09/18/20.  Following surgery, he had no clinical complaints.  Workup did also demonstrate mass within kidney possibly c/w renal cell carcinoma.  He presents today for path review; he is fully independent for age, lives with his wife.   Medications: Current Outpatient Medications on File Prior to Visit  Medication Sig Dispense Refill   amLODipine (NORVASC) 10 MG tablet Take 10 mg by mouth daily.     apixaban (ELIQUIS) 5 MG TABS tablet Take 1 tablet (5 mg total) by mouth 2 (two) times daily. START this prescription after you have completed the starter pack of apixaban. 60 tablet 3   APIXABAN (ELIQUIS) VTE STARTER PACK (10MG AND 5MG) Take as directed on package: start with two-5mg49mblets twice daily for 7 days. On day 8, switch to one-5mg 50mlet twice daily. 74 each 0   dexamethasone (DECADRON) 1 MG tablet Take 2 tablets (2 mg total) by mouth daily. 60 tablet 1   Lacosamide 100 MG TABS Take 1 tablet (100 mg total) by mouth 2 (two) times daily. 60 tablet 3   levETIRAcetam (KEPPRA) 750 MG tablet Take 2 tablets (1,500 mg total) by mouth 2 (two) times daily. 90 tablet 0   loperamide (IMODIUM) 2 MG capsule Take 2 mg by mouth daily as needed for  diarrhea or loose stools.     LORazepam (ATIVAN) 1 MG tablet Take 1 tablet (1 mg total) by mouth every 8 (eight) hours as needed for seizure. 10 tablet 0   simvastatin (ZOCOR) 20 MG tablet Take 1 tablet (20 mg total) by mouth daily. (Patient taking differently: Take 20 mg by mouth at bedtime.) 90 tablet 3   No current facility-administered medications on file prior to visit.    Allergies:  Allergies  Allergen Reactions   Lipitor [Atorvastatin] Other (See Comments)    Caused DOUBLE VISION    Past Medical History:  Past  Medical History:  Diagnosis Date   Actinic keratosis    scalp   Arthritis    ASD (atrial septal defect) 06/10/2015   Atrial flutter, paroxysmal ([place]) 08/01/2015   Cancer of right kidney ([place]) 06/05/2021   Chronic anticoagulation 08/29/2015   Chronic pain of right knee 12/07/2014   Dysplastic nevus 01/11/2015   upper back spinal   Dysplastic nevus 01/29/2017   right superior calf inferior to popliteal   Dysrhythmia    AFIB   Goals of care, counseling/discussion 09/17/2020   Hypertension    Kidney stones    Obstructive sleep apnea syndrome 06/26/2015   PAF (paroxysmal atrial fibrillation) ([place]) 07/30/2015   Pericarditis    1999   PFO (patent foramen ovale) 06/16/2018   Preoperative cardiovascular examination 02/03/2016   S/P TKR (total knee replacement) using cement, 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   Shortness of breath dyspnea    W/ EXERTION    Sleep apnea    CPAP   Stroke ([place])    04-2015   Past Surgical History:  Past Surgical History:  Procedure Laterality Date   APPLICATION OF CRANIAL NAVIGATION Left 09/18/2020   Procedure: APPLICATION OF CRANIAL NAVIGATION;  Surgeon: Consuella Lose, MD;  Location: [facility];  Service: Neurosurgery;  Laterality: Left;   COLONOSCOPY  04/10/2011   Moderate predominantly sigmoid diverticulosis. Small internal hemorrhoids.    COLONOSCOPY  01/30/2020   CRANIOTOMY Left 09/18/2020   Procedure: LEFT FRONTAL CRANIOTOMY FOR  TUMOR;  Surgeon: Consuella Lose, MD;  Location: [facility];  Service: Neurosurgery;  Laterality: Left;  left   CYST REMOVAL NECK     AGE 1   ESOPHAGOGASTRODUODENOSCOPY  04/10/2011   Erosive esophagitis with esophageal stricture (asymptomatic strictures since the patient not having any dysphagia). LA grade D esophagitis.   INGUINAL HERNIA REPAIR     LEFT AS CHILD   ROBOTIC ASSITED PARTIAL NEPHRECTOMY Right 08/28/2021   Procedure: XI ROBOTIC ASSISTED PARTIAL NEPHRECTOMY AND RENAL CYST DECORTICATION WITH INTRAOPERTATIVE ULTRASOUND;  Surgeon: Alexis Frock, MD;  Location: [facility];  Service: Urology;  Laterality: Right;  3 HRS   TOTAL KNEE ARTHROPLASTY Right 03/17/2016   Procedure: TOTAL KNEE ARTHROPLASTY;  Surgeon: [name], MD;  Location: [facility];  Service: Orthopedics;  Laterality: Right;   watchman procedure  07/2019   no longer needs Eliquis   Social History:  Social History   Socioeconomic History   Marital status: Married    Spouse name: Not on file   Number of children: Not on file   Years of education: Not on file   Highest education level: Not on file  Occupational History   Not on file  Tobacco Use   Smoking status: Never   Smokeless tobacco: Never  Vaping Use   Vaping Use: Never used  Substance and Sexual Activity   Alcohol use: No   Drug use: No   Sexual activity: Not on file  Other Topics Concern   Not on file  Social History Narrative   Not on file   Social Determinants of Health   Financial Resource Strain: Not on file  Food Insecurity: Not on file  Transportation Needs: Not on file  Physical Activity: Not on file  Stress: Not on file  Social Connections: Not on file  Intimate Partner Violence: Not on file   Family History:  Family History  Problem Relation Age of Onset   Lung cancer Father    Colon cancer Neg Hx    Esophageal cancer Neg Hx    Colon polyps Neg Hx    Rectal cancer Neg Hx    Stomach cancer Neg Hx     Review of Systems: Constitutional: Doesn't report fevers, chills or abnormal weight loss Eyes: Doesn't report blurriness of vision Ears, nose, mouth, throat, and face: Doesn't report sore throat Respiratory: Doesn't report cough, dyspnea or wheezes Cardiovascular: Doesn't report palpitation, chest discomfort  Gastrointestinal:  Doesn't report nausea, constipation, diarrhea GU: Doesn't report incontinence Skin: Doesn't report skin rashes Neurological: Per HPI Musculoskeletal: Doesn't report joint pain Behavioral/Psych: Doesn't report anxiety  Physical Exam: Vitals:    04/29/22 1140  BP: 130/73  Pulse: 71  Resp: 18  Temp: (!) 97.5 F (36.4 C)  SpO2: 99%   KPS: 60. General: Alert, cooperative, pleasant, in no acute distress Head: Normal EENT: No conjunctival injection or scleral icterus.  Lungs: Resp effort normal Cardiac: Regular rate Abdomen: Non-distended abdomen Skin: No rashes cyanosis or petechiae. Extremities: No clubbing or edema  Neurologic Exam: Mental Status: Awake, alert, attentive to examiner. Oriented to self and environment. Language is notable for impaired fluency, impaired comprehension to multistep commands Cranial Nerves: Visual acuity is grossly normal. Visual fields are full. Extra-ocular movements intact. No ptosis. Face is symmetric Motor: Tone and bulk are normal. Power is 4+/5 in right arm and leg. Reflexes are symmetric, no pathologic reflexes present.  Sensory: Intact to light touch Gait: +dystaxic   Labs: I have reviewed the data as listed    Component Value Date/Time   NA 144 04/29/2022 1113   NA 143 02/13/2020 1037   K 3.7 04/29/2022 1113   CL 110 04/29/2022 1113   CO2 29 04/29/2022 1113   GLUCOSE 127 (H) 04/29/2022 1113   BUN 21 04/29/2022 1113   BUN 13 02/13/2020 1037   CREATININE 1.44 (H) 04/29/2022 1113   CALCIUM 9.2 04/29/2022 1113   PROT 6.4 (L) 04/29/2022 1113   PROT 6.7 02/13/2020 1037   ALBUMIN 3.8 04/29/2022 1113   ALBUMIN 4.3 02/13/2020 1037   AST 13 (L) 04/29/2022 1113   ALT 24 04/29/2022 1113   ALKPHOS 67 04/29/2022 1113   BILITOT 0.5 04/29/2022 1113   GFRNONAA 51 (L) 04/29/2022 0388   GFRAA DUPLICATE 82/80/0349 1791   Lab Results  Component Value Date   WBC 5.3 04/29/2022   NEUTROABS 3.9 04/29/2022   HGB 14.0 04/29/2022   HCT 41.1 04/29/2022   MCV 93.4 04/29/2022   PLT 214 04/29/2022    Imaging:  [place] Clinician Interpretation: I have personally reviewed the CNS images as listed.  My interpretation, in the context of the patient's clinical presentation, is progressive  disease  MR BRAIN W WO CONTRAST  Result Date: 04/25/2022 CLINICAL DATA:  Brain/CNS neoplasm, assess treatment response; glioblastoma EXAM: MRI HEAD WITHOUT AND WITH CONTRAST TECHNIQUE: Multiplanar, multiecho pulse sequences of the brain and surrounding structures were obtained without and with intravenous contrast. CONTRAST:  39m GADAVIST GADOBUTROL 1  MMOL/ML IV SOLN COMPARISON:  03/01/2022 FINDINGS: Brain: As before, there is a surgical cavity in the left frontal lobe with chronic blood products communicating with the left lateral ventricle. As before, there is irregular enhancement at the margins particularly posteriorly and superiorly with extension medially to the body of the corpus callosum and inferiorly to the subinsular region. Comparison with the prior study is limited by motion degradation on that examination. However, enhancement has increased in the areas annotated on series 16, images 96 and 81. Corresponding mild increase in abnormal T2 signal. Chronic intraventricular blood products. Chronic subarachnoid blood products along the brainstem. Stable extra-axial collection underlying the craniotomy. No acute infarction. No hydrocephalus. Vascular: Major vessel flow voids at the skull base are preserved. Skull and upper cervical spine: Normal marrow signal is preserved. Sinuses/Orbits: Minor mucosal thickening.  Orbits are unremarkable. Other: [place] is unremarkable.  Mastoid air cells are clear. IMPRESSION: Further increased enhancement 03/01/2022 with corresponding mild increase in abnormal T2 hyperintensity. Electronically Signed   By: Macy Mis M.D.   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  MRI confirms this progression, with new enhancing disease extending posteriorly and medially across the corpus callosum.   We  will discontinue CCNU due to disease progression.  We discussed goals of care and further salvage options.  He is not a surgical or clinical trials candidate.    We recommended transitioning to next line of therapy; Avastin 51m/kg IV q2 weeks with daily metronomic Temozolomide 50 mg/m2.  The patient will have a CBC, CMP, urine protein performed on days 14 and 28 of each cycle.  Labs may need to be performed more often. Zofran will prescribed for home use for nausea/vomiting.   Informed consent was obtained verbally at bedside to proceed with oral chemotherapy and antibody therapy.  Chemotherapy should be held for the following:  ANC less than 1,000  Platelets less than 100,000  LFT or creatinine greater than 2x ULN  If clinical concerns/contraindications develop  Avastin should be held for the following:  ANC less than 500  Platelets less than 50,000  LFT or creatinine greater than 2x ULN  If clinical concerns/contraindications develop   Decadron may con't 13mdaily for now.  Keppra will continue at 15004mID, Vimpat 100m4mD.    Renal cell carcinoma will con't to be monitored by Dr. EnneMarin Olprrent plan is for observation only.    We ask that Mathew Leach to clinic ASAP for initial infusion of Avastin.  Metronomic TMZ may be initiated once it is obtained.  All questions were answered. The patient knows to call the clinic with any problems, questions or concerns. No barriers to learning were detected.  I have spent a total of 40 minutes of face-to-face and non-face-to-face time, excluding clinical staff time, preparing to see patient, ordering tests and/or medications, counseling the patient, and independently interpreting results and communicating results to the patient/family/caregiver    ZachVentura Sellers Medical Director of Neuro-Oncology [facility]13/23 4:33 PM

## 2022-04-30 ENCOUNTER — Other Ambulatory Visit (HOSPITAL_COMMUNITY): Payer: Self-pay

## 2022-04-30 NOTE — Telephone Encounter (Signed)
Oral Chemotherapy Pharmacist Encounter  I spoke with patient's wife for overview of: Temodar (temozolomide) for the treatment of glioblastoma in conjunction with bevacizumab, planned duration until disease progression or unacceptable drug toxicity.  Reviewed administration, dosing, side effects, monitoring, drug-food interactions, safe handling, storage, and disposal.  Patient will take Temodar '100mg'$  capsules, 1 capsule, ('100mg'$  total daily dose), by mouth once daily, may take at bedtime and on an empty stomach to decrease nausea and vomiting.   Patient will be initiated on metronomic dosing of Temodar and will be taking this daily.   Temodar start date: 05/04/22 PM   Patient will take Zofran '8mg'$  tablet if nausea occurs. In this case patient will take 1 tablet (8 mg total) by mouth 30-60 min prior to Temodar dose to help decrease N/V.   Adverse effects include but are not limited to: nausea, vomiting, anorexia, GI upset, rash, drug fever, and fatigue. Rare but serious adverse effects of pneumocystis pneumonia and secondary malignancy also discussed.  We discussed strategies to manage constipation if they occur secondary to ondansetron dosing.  PCP prophylaxis will not be initiated at this time, but may be added based on lymphocyte count in the future.  Reviewed importance of keeping a medication schedule and plan for any missed doses. No barriers to medication adherence identified.  Medication reconciliation performed and medication/allergy list updated.  Insurance authorization for Temodar has been obtained. Test claim at the pharmacy revealed copayment $14 for 1st fill of Temodar. This will ship from the [facility] on 05/01/22 to deliver to patient's home on 05/02/22.  Patient's wife informed the pharmacy will reach out 5-7 days prior to needing next fill of Temodar to coordinate continued medication acquisition to prevent break in therapy.  All questions  answered.  Patient's wife voiced understanding and appreciation.   Medication education handout placed in mail for patient and and patient's wife. Patient's family knows to call the office with questions or concerns. Oral Chemotherapy Clinic phone number provided.   Leron Croak, PharmD, BCPS Hematology/Oncology Clinical Pharmacist [facility] [phone] 04/30/2022 12:00 PM

## 2022-05-01 ENCOUNTER — Other Ambulatory Visit (HOSPITAL_COMMUNITY): Payer: Self-pay

## 2022-05-01 DIAGNOSIS — I1310 Hypertensive heart and chronic kidney disease without heart failure, with stage 1 through stage 4 chronic kidney disease, or unspecified chronic kidney disease: Secondary | ICD-10-CM | POA: Diagnosis not present

## 2022-05-01 DIAGNOSIS — I480 Paroxysmal atrial fibrillation: Secondary | ICD-10-CM | POA: Diagnosis not present

## 2022-05-01 DIAGNOSIS — G40803 Other epilepsy, intractable, with status epilepticus: Secondary | ICD-10-CM | POA: Diagnosis not present

## 2022-05-01 DIAGNOSIS — C711 Malignant neoplasm of frontal lobe: Secondary | ICD-10-CM | POA: Diagnosis not present

## 2022-05-01 DIAGNOSIS — N182 Chronic kidney disease, stage 2 (mild): Secondary | ICD-10-CM | POA: Diagnosis not present

## 2022-05-01 DIAGNOSIS — I2609 Other pulmonary embolism with acute cor pulmonale: Secondary | ICD-10-CM | POA: Diagnosis not present

## 2022-05-05 ENCOUNTER — Telehealth: Payer: Self-pay | Admitting: *Deleted

## 2022-05-05 NOTE — Telephone Encounter (Signed)
[facility] Physical Therapist called to report that patient has decided to discontinue the PT but is keeping the Nursing via [place].

## 2022-05-08 ENCOUNTER — Inpatient Hospital Stay: Payer: Medicare Other

## 2022-05-08 ENCOUNTER — Other Ambulatory Visit: Payer: Self-pay

## 2022-05-08 ENCOUNTER — Inpatient Hospital Stay (HOSPITAL_BASED_OUTPATIENT_CLINIC_OR_DEPARTMENT_OTHER): Payer: Medicare Other | Admitting: Internal Medicine

## 2022-05-08 VITALS — BP 129/64 | HR 59 | Temp 97.7°F | Resp 16

## 2022-05-08 VITALS — BP 114/62 | HR 60 | Temp 97.8°F | Resp 17 | Ht 68.0 in | Wt 159.0 lb

## 2022-05-08 DIAGNOSIS — C711 Malignant neoplasm of frontal lobe: Secondary | ICD-10-CM

## 2022-05-08 DIAGNOSIS — R569 Unspecified convulsions: Secondary | ICD-10-CM | POA: Diagnosis not present

## 2022-05-08 DIAGNOSIS — Z5112 Encounter for antineoplastic immunotherapy: Secondary | ICD-10-CM | POA: Diagnosis not present

## 2022-05-08 LAB — CBC WITH DIFFERENTIAL (CANCER CENTER ONLY)
Abs Immature Granulocytes: 0.04 10*3/uL (ref 0.00–0.07)
Basophils Absolute: 0 10*3/uL (ref 0.0–0.1)
Basophils Relative: 1 %
Eosinophils Absolute: 0 10*3/uL (ref 0.0–0.5)
Eosinophils Relative: 1 %
HCT: 40 % (ref 39.0–52.0)
Hemoglobin: 13.9 g/dL (ref 13.0–17.0)
Immature Granulocytes: 1 %
Lymphocytes Relative: 28 %
Lymphs Abs: 1.5 10*3/uL (ref 0.7–4.0)
MCH: 32.2 pg (ref 26.0–34.0)
MCHC: 34.8 g/dL (ref 30.0–36.0)
MCV: 92.6 fL (ref 80.0–100.0)
Monocytes Absolute: 0.3 10*3/uL (ref 0.1–1.0)
Monocytes Relative: 5 %
Neutro Abs: 3.7 10*3/uL (ref 1.7–7.7)
Neutrophils Relative %: 64 %
Platelet Count: 177 10*3/uL (ref 150–400)
RBC: 4.32 MIL/uL (ref 4.22–5.81)
RDW: 12.9 % (ref 11.5–15.5)
WBC Count: 5.6 10*3/uL (ref 4.0–10.5)
nRBC: 0 % (ref 0.0–0.2)

## 2022-05-08 LAB — CMP (CANCER CENTER ONLY)
ALT: 24 U/L (ref 0–44)
AST: 14 U/L — ABNORMAL LOW (ref 15–41)
Albumin: 3.8 g/dL (ref 3.5–5.0)
Alkaline Phosphatase: 69 U/L (ref 38–126)
Anion gap: 6 (ref 5–15)
BUN: 21 mg/dL (ref 8–23)
CO2: 30 mmol/L (ref 22–32)
Calcium: 9.2 mg/dL (ref 8.9–10.3)
Chloride: 107 mmol/L (ref 98–111)
Creatinine: 1.44 mg/dL — ABNORMAL HIGH (ref 0.61–1.24)
GFR, Estimated: 51 mL/min — ABNORMAL LOW (ref >60)
Glucose, Bld: 134 mg/dL — ABNORMAL HIGH (ref 70–99)
Potassium: 3.9 mmol/L (ref 3.5–5.1)
Sodium: 143 mmol/L (ref 135–145)
Total Bilirubin: 0.5 mg/dL (ref 0.3–1.2)
Total Protein: 6.2 g/dL — ABNORMAL LOW (ref 6.5–8.1)

## 2022-05-08 MED ORDER — SODIUM CHLORIDE 0.9 % IV SOLN
10.0000 mg/kg | Freq: Once | INTRAVENOUS | Status: AC
  Administered 2022-05-08: 700 mg via INTRAVENOUS
  Filled 2022-05-08: qty 16

## 2022-05-08 MED ORDER — SODIUM CHLORIDE 0.9 % IV SOLN: Freq: Once | INTRAVENOUS | Status: AC

## 2022-05-08 NOTE — Progress Notes (Signed)
First time Avastin approved for treatment today by Dr Mickeal Skinner 05/08/2022.  Ok to proceed without urine protein.

## 2022-05-08 NOTE — Patient Instructions (Addendum)
[facility]  Discharge Instructions: Thank you for choosing [facility] to provide your oncology and hematology care.   If you have a lab appointment with the Cancer Center, please go directly to the Cancer Center and check in at the registration area.   Wear comfortable clothing and clothing appropriate for easy access to any Portacath or PICC line.   We strive to give you quality time with your provider. You may need to reschedule your appointment if you arrive late (15 or more minutes).  Arriving late affects you and other patients whose appointments are after yours.  Also, if you miss three or more appointments without notifying the office, you may be dismissed from the clinic at the provider's discretion.      For prescription refill requests, have your pharmacy contact our office and allow 72 hours for refills to be completed.    Today you received the following chemotherapy and/or immunotherapy agent: Bevacizumab bvzr (Zirabev)     To help prevent nausea and vomiting after your treatment, we encourage you to take your nausea medication as directed.  BELOW ARE SYMPTOMS THAT SHOULD BE REPORTED IMMEDIATELY: *FEVER GREATER THAN 100.4 F (38 C) OR HIGHER *CHILLS OR SWEATING *NAUSEA AND VOMITING THAT IS NOT CONTROLLED WITH YOUR NAUSEA MEDICATION *UNUSUAL SHORTNESS OF BREATH *UNUSUAL BRUISING OR BLEEDING *URINARY PROBLEMS (pain or burning when urinating, or frequent urination) *BOWEL PROBLEMS (unusual diarrhea, constipation, pain near the anus) TENDERNESS IN MOUTH AND THROAT WITH OR WITHOUT PRESENCE OF ULCERS (sore throat, sores in mouth, or a toothache) UNUSUAL RASH, SWELLING OR PAIN  UNUSUAL VAGINAL DISCHARGE OR ITCHING   Items with * indicate a potential emergency and should be followed up as soon as possible or go to the Emergency Department if any problems should occur.  Please show the CHEMOTHERAPY ALERT CARD or IMMUNOTHERAPY ALERT CARD  at check-in to the Emergency Department and triage nurse.  Should you have questions after your visit or need to cancel or reschedule your appointment, please contact [facility]  Dept: [phone]  and follow the prompts.  Office hours are 8:00 a.m. to 4:30 p.m. Monday - Friday. Please note that voicemails left after 4:00 p.m. may not be returned until the following business day.  We are closed weekends and major holidays. You have access to a nurse at all times for urgent questions. Please call the main number to the clinic Dept: [phone] and follow the prompts.   For any non-urgent questions, you may also contact your provider using MyChart. We now offer e-Visits for anyone 18 and older to request care online for non-urgent symptoms. For details visit [email or web address].   Also download the MyChart app! Go to the app store, search "MyChart", open the app, select [facility], and log in with your MyChart username and password.  Masks are optional in the cancer centers. If you would like for your care team to wear a mask while they are taking care of you, please let them know. For doctor visits, patients may have with them one support person who is at least 73 years old. At this time, visitors are not allowed in the infusion area.  Bevacizumab injection What is this medication? BEVACIZUMAB (be va SIZ yoo mab) is a monoclonal antibody. It is used to treat many types of cancer. This medicine may be used for other purposes; ask your health care provider or pharmacist if you have questions. COMMON BRAND NAME(S): Alymsys, Avastin,   MVASI, Zirabev What should I tell my care team before I take this medication? They need to know if you have any of these conditions: diabetes heart disease high blood pressure history of coughing up blood prior anthracycline chemotherapy (e.g., doxorubicin, daunorubicin, epirubicin) recent or ongoing radiation therapy recent or  planning to have surgery stroke an unusual or allergic reaction to bevacizumab, hamster proteins, mouse proteins, other medicines, foods, dyes, or preservatives pregnant or trying to get pregnant breast-feeding How should I use this medication? This medicine is for infusion into a vein. It is given by a health care professional in a hospital or clinic setting. Talk to your pediatrician regarding the use of this medicine in children. Special care may be needed. Overdosage: If you think you have taken too much of this medicine contact a poison control center or emergency room at once. NOTE: This medicine is only for you. Do not share this medicine with others. What if I miss a dose? It is important not to miss your dose. Call your doctor or health care professional if you are unable to keep an appointment. What may interact with this medication? Interactions are not expected. This list may not describe all possible interactions. Give your health care provider a list of all the medicines, herbs, non-prescription drugs, or dietary supplements you use. Also tell them if you smoke, drink alcohol, or use illegal drugs. Some items may interact with your medicine. What should I watch for while using this medication? Your condition will be monitored carefully while you are receiving this medicine. You will need important blood work and urine testing done while you are taking this medicine. This medicine may increase your risk to bruise or bleed. Call your doctor or health care professional if you notice any unusual bleeding. Before having surgery, talk to your health care provider to make sure it is ok. This drug can increase the risk of poor healing of your surgical site or wound. You will need to stop this drug for 28 days before surgery. After surgery, wait at least 28 days before restarting this drug. Make sure the surgical site or wound is healed enough before restarting this drug. Talk to your health  care provider if questions. Do not become pregnant while taking this medicine or for 6 months after stopping it. Women should inform their doctor if they wish to become pregnant or think they might be pregnant. There is a potential for serious side effects to an unborn child. Talk to your health care professional or pharmacist for more information. Do not breast-feed an infant while taking this medicine and for 6 months after the last dose. This medicine has caused ovarian failure in some women. This medicine may interfere with the ability to have a child. You should talk to your doctor or health care professional if you are concerned about your fertility. What side effects may I notice from receiving this medication? Side effects that you should report to your doctor or health care professional as soon as possible: allergic reactions like skin rash, itching or hives, swelling of the face, lips, or tongue chest pain or chest tightness chills coughing up blood high fever seizures severe constipation signs and symptoms of bleeding such as bloody or black, tarry stools; red or dark-brown urine; spitting up blood or brown material that looks like coffee grounds; red spots on the skin; unusual bruising or bleeding from the eye, gums, or nose signs and symptoms of a blood clot such as breathing problems;   chest pain; severe, sudden headache; pain, swelling, warmth in the leg signs and symptoms of a stroke like changes in vision; confusion; trouble speaking or understanding; severe headaches; sudden numbness or weakness of the face, arm or leg; trouble walking; dizziness; loss of balance or coordination stomach pain sweating swelling of legs or ankles vomiting weight gain Side effects that usually do not require medical attention (report to your doctor or health care professional if they continue or are bothersome): back pain changes in taste decreased appetite dry skin nausea tiredness This list  may not describe all possible side effects. Call your doctor for medical advice about side effects. You may report side effects to FDA at 1-800-FDA-1088. Where should I keep my medication? This drug is given in a hospital or clinic and will not be stored at home. NOTE: This sheet is a summary. It may not cover all possible information. If you have questions about this medicine, talk to your doctor, pharmacist, or health care provider.  2023 Elsevier/Gold Standard (2021-10-04 00:00:00) 

## 2022-05-09 ENCOUNTER — Telehealth: Payer: Self-pay | Admitting: Internal Medicine

## 2022-05-09 ENCOUNTER — Other Ambulatory Visit: Payer: Self-pay | Admitting: *Deleted

## 2022-05-09 ENCOUNTER — Telehealth: Payer: Self-pay | Admitting: *Deleted

## 2022-05-09 DIAGNOSIS — I1310 Hypertensive heart and chronic kidney disease without heart failure, with stage 1 through stage 4 chronic kidney disease, or unspecified chronic kidney disease: Secondary | ICD-10-CM | POA: Diagnosis not present

## 2022-05-09 DIAGNOSIS — I480 Paroxysmal atrial fibrillation: Secondary | ICD-10-CM | POA: Diagnosis not present

## 2022-05-09 DIAGNOSIS — C711 Malignant neoplasm of frontal lobe: Secondary | ICD-10-CM | POA: Diagnosis not present

## 2022-05-09 DIAGNOSIS — G40803 Other epilepsy, intractable, with status epilepticus: Secondary | ICD-10-CM | POA: Diagnosis not present

## 2022-05-09 DIAGNOSIS — I2609 Other pulmonary embolism with acute cor pulmonale: Secondary | ICD-10-CM | POA: Diagnosis not present

## 2022-05-09 DIAGNOSIS — N182 Chronic kidney disease, stage 2 (mild): Secondary | ICD-10-CM | POA: Diagnosis not present

## 2022-05-09 NOTE — Telephone Encounter (Signed)
Called & spoke with pt's wife who reports pt did well with treatment yesterday.  Noted that Lurena Joiner Fanning/Pharmacist had discussed temozolumab with wife & Nurse Junie Bame gave pt/wife education packet on Bevacizumab.  Wife states she doesn't want to come in for formal education class.  Discussed side effects of bleeding & informed to call with any change/concerns.  She expressed understanding.  She knows next appt.  & how to reach [place] if needed.

## 2022-05-09 NOTE — Telephone Encounter (Signed)
Per 6/22 los called and spoke to pt wife about appointment in 2 weeks.  She confirmed the appointment

## 2022-05-14 DIAGNOSIS — C711 Malignant neoplasm of frontal lobe: Secondary | ICD-10-CM | POA: Diagnosis not present

## 2022-05-14 DIAGNOSIS — I1310 Hypertensive heart and chronic kidney disease without heart failure, with stage 1 through stage 4 chronic kidney disease, or unspecified chronic kidney disease: Secondary | ICD-10-CM | POA: Diagnosis not present

## 2022-05-14 DIAGNOSIS — G40803 Other epilepsy, intractable, with status epilepticus: Secondary | ICD-10-CM | POA: Diagnosis not present

## 2022-05-14 DIAGNOSIS — I480 Paroxysmal atrial fibrillation: Secondary | ICD-10-CM | POA: Diagnosis not present

## 2022-05-14 DIAGNOSIS — I2609 Other pulmonary embolism with acute cor pulmonale: Secondary | ICD-10-CM | POA: Diagnosis not present

## 2022-05-14 DIAGNOSIS — N182 Chronic kidney disease, stage 2 (mild): Secondary | ICD-10-CM | POA: Diagnosis not present

## 2022-05-17 DIAGNOSIS — I2699 Other pulmonary embolism without acute cor pulmonale: Secondary | ICD-10-CM | POA: Diagnosis not present

## 2022-05-17 DIAGNOSIS — C719 Malignant neoplasm of brain, unspecified: Secondary | ICD-10-CM | POA: Diagnosis not present

## 2022-05-21 ENCOUNTER — Other Ambulatory Visit (HOSPITAL_COMMUNITY): Payer: Self-pay

## 2022-05-22 ENCOUNTER — Inpatient Hospital Stay (HOSPITAL_BASED_OUTPATIENT_CLINIC_OR_DEPARTMENT_OTHER): Payer: Medicare Other | Admitting: Internal Medicine

## 2022-05-22 ENCOUNTER — Inpatient Hospital Stay: Payer: Medicare Other | Attending: Internal Medicine

## 2022-05-22 ENCOUNTER — Other Ambulatory Visit: Payer: Self-pay

## 2022-05-22 ENCOUNTER — Inpatient Hospital Stay: Payer: Medicare Other

## 2022-05-22 VITALS — BP 115/65 | HR 71 | Temp 98.1°F | Resp 16 | Ht 68.0 in | Wt 155.8 lb

## 2022-05-22 DIAGNOSIS — R569 Unspecified convulsions: Secondary | ICD-10-CM | POA: Diagnosis not present

## 2022-05-22 DIAGNOSIS — C711 Malignant neoplasm of frontal lobe: Secondary | ICD-10-CM | POA: Diagnosis not present

## 2022-05-22 DIAGNOSIS — Z79899 Other long term (current) drug therapy: Secondary | ICD-10-CM | POA: Insufficient documentation

## 2022-05-22 DIAGNOSIS — Z7189 Other specified counseling: Secondary | ICD-10-CM

## 2022-05-22 DIAGNOSIS — Z5112 Encounter for antineoplastic immunotherapy: Secondary | ICD-10-CM | POA: Insufficient documentation

## 2022-05-22 LAB — CMP (CANCER CENTER ONLY)
ALT: 23 U/L (ref 0–44)
AST: 21 U/L (ref 15–41)
Albumin: 3.9 g/dL (ref 3.5–5.0)
Alkaline Phosphatase: 86 U/L (ref 38–126)
Anion gap: 7 (ref 5–15)
BUN: 12 mg/dL (ref 8–23)
CO2: 28 mmol/L (ref 22–32)
Calcium: 9.2 mg/dL (ref 8.9–10.3)
Chloride: 108 mmol/L (ref 98–111)
Creatinine: 1.47 mg/dL — ABNORMAL HIGH (ref 0.61–1.24)
GFR, Estimated: 50 mL/min — ABNORMAL LOW (ref >60)
Glucose, Bld: 127 mg/dL — ABNORMAL HIGH (ref 70–99)
Potassium: 3.6 mmol/L (ref 3.5–5.1)
Sodium: 143 mmol/L (ref 135–145)
Total Bilirubin: 0.5 mg/dL (ref 0.3–1.2)
Total Protein: 6.5 g/dL (ref 6.5–8.1)

## 2022-05-22 LAB — CBC WITH DIFFERENTIAL (CANCER CENTER ONLY)
Abs Immature Granulocytes: 0.05 10*3/uL (ref 0.00–0.07)
Basophils Absolute: 0.1 10*3/uL (ref 0.0–0.1)
Basophils Relative: 1 %
Eosinophils Absolute: 0.2 10*3/uL (ref 0.0–0.5)
Eosinophils Relative: 4 %
HCT: 41.5 % (ref 39.0–52.0)
Hemoglobin: 14.5 g/dL (ref 13.0–17.0)
Immature Granulocytes: 1 %
Lymphocytes Relative: 23 %
Lymphs Abs: 1 10*3/uL (ref 0.7–4.0)
MCH: 32 pg (ref 26.0–34.0)
MCHC: 34.9 g/dL (ref 30.0–36.0)
MCV: 91.6 fL (ref 80.0–100.0)
Monocytes Absolute: 0.7 10*3/uL (ref 0.1–1.0)
Monocytes Relative: 15 %
Neutro Abs: 2.5 10*3/uL (ref 1.7–7.7)
Neutrophils Relative %: 56 %
Platelet Count: 239 10*3/uL (ref 150–400)
RBC: 4.53 MIL/uL (ref 4.22–5.81)
RDW: 13.2 % (ref 11.5–15.5)
WBC Count: 4.5 10*3/uL (ref 4.0–10.5)
nRBC: 0 % (ref 0.0–0.2)

## 2022-05-22 LAB — TOTAL PROTEIN, URINE DIPSTICK: Protein, ur: 30 mg/dL — AB

## 2022-05-22 MED ORDER — SODIUM CHLORIDE 0.9 % IV SOLN
10.0000 mg/kg | Freq: Once | INTRAVENOUS | Status: AC
  Administered 2022-05-22: 700 mg via INTRAVENOUS
  Filled 2022-05-22: qty 16

## 2022-05-22 MED ORDER — SODIUM CHLORIDE 0.9 % IV SOLN: Freq: Once | INTRAVENOUS | Status: AC

## 2022-05-22 NOTE — Patient Instructions (Signed)
[facility]  Discharge Instructions: Thank you for choosing [facility] to provide your oncology and hematology care.   If you have a lab appointment with the [facility], please go directly to the [facility] and check in at the registration area.   Wear comfortable clothing and clothing appropriate for easy access to any Portacath or PICC line.   We strive to give you quality time with your provider. You may need to reschedule your appointment if you arrive late (15 or more minutes).  Arriving late affects you and other patients whose appointments are after yours.  Also, if you miss three or more appointments without notifying the office, you may be dismissed from the clinic at the provider's discretion.      For prescription refill requests, have your pharmacy contact our office and allow 72 hours for refills to be completed.    Today you received the following chemotherapy and/or immunotherapy agent: Bevacizumab bvzr Mathew Leach)     To help prevent nausea and vomiting after your treatment, we encourage you to take your nausea medication as directed.  BELOW ARE SYMPTOMS THAT SHOULD BE REPORTED IMMEDIATELY: *FEVER GREATER THAN 100.4 F (38 C) OR HIGHER *CHILLS OR SWEATING *NAUSEA AND VOMITING THAT IS NOT CONTROLLED WITH YOUR NAUSEA MEDICATION *UNUSUAL SHORTNESS OF BREATH *UNUSUAL BRUISING OR BLEEDING *URINARY PROBLEMS (pain or burning when urinating, or frequent urination) *BOWEL PROBLEMS (unusual diarrhea, constipation, pain near the anus) TENDERNESS IN MOUTH AND THROAT WITH OR WITHOUT PRESENCE OF ULCERS (sore throat, sores in mouth, or a toothache) UNUSUAL RASH, SWELLING OR PAIN  UNUSUAL VAGINAL DISCHARGE OR ITCHING   Items with * indicate a potential emergency and should be followed up as soon as possible or go to the Emergency Department if any problems should occur.  Please show the CHEMOTHERAPY ALERT CARD or IMMUNOTHERAPY ALERT CARD  at check-in to the Emergency Department and triage nurse.  Should you have questions after your visit or need to cancel or reschedule your appointment, please contact [facility]  Dept: [phone]  and follow the prompts.  Office hours are 8:00 a.m. to 4:30 p.m. Monday - Friday. Please note that voicemails left after 4:00 p.m. may not be returned until the following business day.  We are closed weekends and major holidays. You have access to a nurse at all times for urgent questions. Please call the main number to the clinic Dept: [phone] and follow the prompts.   For any non-urgent questions, you may also contact your provider using MyChart. We now offer e-Visits for anyone 10 and older to request care online for non-urgent symptoms. For details visit [email or web address].   Also download the MyChart app! Go to the app store, search "MyChart", open the app, select [facility], and log in with your MyChart username and password.  Masks are optional in the cancer centers. If you would like for your care team to wear a mask while they are taking care of you, please let them know. For doctor visits, patients may have with them one support person who is at least 73 years old. At this time, visitors are not allowed in the infusion area.  Bevacizumab injection What is this medication? BEVACIZUMAB (be va SIZ yoo mab) is a monoclonal antibody. It is used to treat many types of cancer. This medicine may be used for other purposes; ask your health care provider or pharmacist if you have questions. COMMON BRAND NAME(S): Alymsys, Avastin,  MVASI, Mathew Leach What should I tell my care team before I take this medication? They need to know if you have any of these conditions: diabetes heart disease high blood pressure history of coughing up blood prior anthracycline chemotherapy (e.g., doxorubicin, daunorubicin, epirubicin) recent or ongoing radiation therapy recent or  planning to have surgery stroke an unusual or allergic reaction to bevacizumab, hamster proteins, mouse proteins, other medicines, foods, dyes, or preservatives pregnant or trying to get pregnant breast-feeding How should I use this medication? This medicine is for infusion into a vein. It is given by a health care professional in a hospital or clinic setting. Talk to your pediatrician regarding the use of this medicine in children. Special care may be needed. Overdosage: If you think you have taken too much of this medicine contact a poison control center or emergency room at once. NOTE: This medicine is only for you. Do not share this medicine with others. What if I miss a dose? It is important not to miss your dose. Call your doctor or health care professional if you are unable to keep an appointment. What may interact with this medication? Interactions are not expected. This list may not describe all possible interactions. Give your health care provider a list of all the medicines, herbs, non-prescription drugs, or dietary supplements you use. Also tell them if you smoke, drink alcohol, or use illegal drugs. Some items may interact with your medicine. What should I watch for while using this medication? Your condition will be monitored carefully while you are receiving this medicine. You will need important blood work and urine testing done while you are taking this medicine. This medicine may increase your risk to bruise or bleed. Call your doctor or health care professional if you notice any unusual bleeding. Before having surgery, talk to your health care provider to make sure it is ok. This drug can increase the risk of poor healing of your surgical site or wound. You will need to stop this drug for 28 days before surgery. After surgery, wait at least 28 days before restarting this drug. Make sure the surgical site or wound is healed enough before restarting this drug. Talk to your health  care provider if questions. Do not become pregnant while taking this medicine or for 6 months after stopping it. Women should inform their doctor if they wish to become pregnant or think they might be pregnant. There is a potential for serious side effects to an unborn child. Talk to your health care professional or pharmacist for more information. Do not breast-feed an infant while taking this medicine and for 6 months after the last dose. This medicine has caused ovarian failure in some women. This medicine may interfere with the ability to have a child. You should talk to your doctor or health care professional if you are concerned about your fertility. What side effects may I notice from receiving this medication? Side effects that you should report to your doctor or health care professional as soon as possible: allergic reactions like skin rash, itching or hives, swelling of the face, lips, or tongue chest pain or chest tightness chills coughing up blood high fever seizures severe constipation signs and symptoms of bleeding such as bloody or black, tarry stools; red or dark-brown urine; spitting up blood or brown material that looks like coffee grounds; red spots on the skin; unusual bruising or bleeding from the eye, gums, or nose signs and symptoms of a blood clot such as breathing problems;  chest pain; severe, sudden headache; pain, swelling, warmth in the leg signs and symptoms of a stroke like changes in vision; confusion; trouble speaking or understanding; severe headaches; sudden numbness or weakness of the face, arm or leg; trouble walking; dizziness; loss of balance or coordination stomach pain sweating swelling of legs or ankles vomiting weight gain Side effects that usually do not require medical attention (report to your doctor or health care professional if they continue or are bothersome): back pain changes in taste decreased appetite dry skin nausea tiredness This list  may not describe all possible side effects. Call your doctor for medical advice about side effects. You may report side effects to FDA at 1-800-FDA-1088. Where should I keep my medication? This drug is given in a hospital or clinic and will not be stored at home. NOTE: This sheet is a summary. It may not cover all possible information. If you have questions about this medicine, talk to your doctor, pharmacist, or health care provider.  2023 Elsevier/Gold Standard (2021-10-04 00:00:00)

## 2022-05-22 NOTE — Progress Notes (Signed)
[facility] [place], [place] [address] [phone]   Interval Evaluation  Date of Service: 05/22/22 Patient Name: Mathew Leach Patient MRN: [id number] Patient DOB: 1951/02/05 Provider: Ventura Sellers, MD  Identifying Statement:  Mathew Leach is a 71 y.o. male with left frontal glioblastoma   Referring Provider:  Oncologic History: Oncology History  Frontal glioblastoma multiforme ([facility])  09/18/2020 Surgery   Left frontal craniotomy, resection by Dr. Kathyrn Leach; path demonstrates Glioblastaoma IDH-wt   10/17/2020 - 11/06/2020 Radiation Therapy   Completes abbreviated 15 fraction IMRT w/ concurrent Temodar      Chemotherapy   Patient is on Treatment Plan :  BRAIN GLIOBLASTOMA Post XRT Temozolomide Days 1-5 q28 Days      12/31/2021 Progression   Progression of disease.  Plan to resume 5-day Temodar.   02/18/2022 Progression   Progression of disease confirmed on 1 month f/u MRI   03/13/2022 - 03/13/2022 Chemotherapy   Patient is on Treatment Plan : BRAIN [place] q42d     05/04/2022 -  Chemotherapy   Patient is on Treatment Plan : BRAIN GLIOBLASTOMA Consolidation Temozolomide Days 1-5 q28 Days      05/08/2022 -  Chemotherapy   Patient is on Treatment Plan : BRAIN GBM Bevacizumab 14d x 6 cycles       Biomarkers:  MGMT Unknown.  IDH 1/2 Wild type.  EGFR Unknown  TERT Unknown   Interval History: Mathew Leach presents today for second avastin infusion.  Wife describes continued decline in mentation, functional status even since avastin was started.  He is taking the daily Temodar as well.  Left sided weakness, clumsiness is increased, he is no longer walking around the home by himself.  In a wheelchair today.  Short term memory has declined. Currently on Keppra 1578m BID and Vimpat 1013mBID without further seizures.  Not on decadron currently.  H+P (10/04/20)  [name] to medical  attention three weeks ago with new onset generalized seizure.  This occurred at work, unwitnessed, and then second event was witnessed by EMS described as generalized shaking.  CNS imaging demonstrated left frontal mass lesion, which was resected by Dr. NuKathyrn Sheriffn 09/18/20.  Following surgery, he had no clinical complaints.  Workup did also demonstrate mass within kidney possibly c/w renal cell carcinoma.  He presents today for path review; he is fully independent for age, lives with his wife.   Medications: Current Outpatient Medications on File Prior to Visit  Medication Sig Dispense Refill   amLODipine (NORVASC) 10 MG tablet Take 10 mg by mouth daily.     apixaban (ELIQUIS) 5 MG TABS tablet Take 1 tablet (5 mg total) by mouth 2 (two) times daily. START this prescription after you have completed the starter pack of apixaban. 60 tablet 3   Lacosamide 100 MG TABS Take 1 tablet (100 mg total) by mouth 2 (two) times daily. 60 tablet 3   levETIRAcetam (KEPPRA) 750 MG tablet Take 2 tablets (1,500 mg total) by mouth 2 (two) times daily. 90 tablet 0   loperamide (IMODIUM) 2 MG capsule Take 2 mg by mouth daily as needed for diarrhea or loose stools.     LORazepam (ATIVAN) 1 MG tablet Take 1 tablet (1 mg total) by mouth every 8 (eight) hours as needed for seizure. 10 tablet 0   ondansetron (ZOFRAN) 8 MG tablet Take 1 tablet (8 mg total) by mouth 2 (two) times daily as needed (  nausea and vomiting). May take 30-60 minutes prior to Temodar administration if nausea/vomiting occurs. 30 tablet 1   simvastatin (ZOCOR) 20 MG tablet Take 1 tablet (20 mg total) by mouth daily. (Patient taking differently: Take 20 mg by mouth at bedtime.) 90 tablet 3   temozolomide (TEMODAR) 100 MG capsule Take 1 capsule (100 mg total) by mouth daily. May take on an empty stomach to decrease nausea & vomiting. 28 capsule 0   No current facility-administered medications on file prior to visit.    Allergies:  Allergies  Allergen  Reactions   Lipitor [Atorvastatin] Other (See Comments)    Caused DOUBLE VISION    Past Medical History:  Past Medical History:  Diagnosis Date   Actinic keratosis    scalp   Arthritis    ASD (atrial septal defect) 06/10/2015   Atrial flutter, paroxysmal ([place]) 08/01/2015   Cancer of right kidney ([facility]) 06/05/2021   Chronic anticoagulation 08/29/2015   Chronic pain of right knee 12/07/2014   Dysplastic nevus 01/11/2015   upper back spinal   Dysplastic nevus 01/29/2017   right superior calf inferior to popliteal   Dysrhythmia    AFIB   Goals of care, counseling/discussion 09/17/2020   Hypertension    Kidney stones    Obstructive sleep apnea syndrome 06/26/2015   PAF (paroxysmal atrial fibrillation) ([employer]) 07/30/2015   Pericarditis    1999   PFO (patent foramen ovale) 06/16/2018   Preoperative cardiovascular examination 02/03/2016   S/P TKR (total knee replacement) using cement, 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   Shortness of breath dyspnea    W/ EXERTION    Sleep apnea    CPAP   Stroke ([facility])    04-2015   Past Surgical History:  Past Surgical History:  Procedure Laterality Date   APPLICATION OF CRANIAL NAVIGATION Left 09/18/2020   Procedure: APPLICATION OF CRANIAL NAVIGATION;  Surgeon: Consuella Lose, MD;  Location: [facility];  Service: Neurosurgery;  Laterality: Left;   COLONOSCOPY  04/10/2011   Moderate predominantly sigmoid diverticulosis. Small internal hemorrhoids.    COLONOSCOPY  01/30/2020   CRANIOTOMY Left 09/18/2020   Procedure: LEFT FRONTAL CRANIOTOMY FOR  TUMOR;  Surgeon: Consuella Lose, MD;  Location: [facility];  Service: Neurosurgery;  Laterality: Left;  left   CYST REMOVAL NECK     AGE 56   ESOPHAGOGASTRODUODENOSCOPY  04/10/2011   Erosive esophagitis with esophageal stricture (asymptomatic strictures since the patient not having any dysphagia). LA grade D esophagitis.   INGUINAL HERNIA REPAIR     LEFT AS CHILD   ROBOTIC ASSITED PARTIAL NEPHRECTOMY Right 08/28/2021   Procedure: XI  ROBOTIC ASSISTED PARTIAL NEPHRECTOMY AND RENAL CYST DECORTICATION WITH INTRAOPERTATIVE ULTRASOUND;  Surgeon: Alexis Frock, MD;  Location: [facility];  Service: Urology;  Laterality: Right;  3 HRS   TOTAL KNEE ARTHROPLASTY Right 03/17/2016   Procedure: TOTAL KNEE ARTHROPLASTY;  Surgeon: [name], MD;  Location: [facility];  Service: Orthopedics;  Laterality: Right;   watchman procedure  07/2019   no longer needs Eliquis   Social History:  Social History   Socioeconomic History   Marital status: Married    Spouse name: Not on file   Number of children: Not on file   Years of education: Not on file   Highest education level: Not on file  Occupational History   Not on file  Tobacco Use   Smoking status: Never   Smokeless tobacco: Never  Vaping Use   Vaping Use: Never used  Substance and Sexual Activity   Alcohol  use: No   Drug use: No   Sexual activity: Not on file  Other Topics Concern   Not on file  Social History Narrative   Not on file   Social Determinants of Health   Financial Resource Strain: Not on file  Food Insecurity: Not on file  Transportation Needs: Not on file  Physical Activity: Not on file  Stress: Not on file  Social Connections: Not on file  Intimate Partner Violence: Not on file   Family History:  Family History  Problem Relation Age of Onset   Lung cancer Father    Colon cancer Neg Hx    Esophageal cancer Neg Hx    Colon polyps Neg Hx    Rectal cancer Neg Hx    Stomach cancer Neg Hx     Review of Systems: Constitutional: Doesn't report fevers, chills or abnormal weight loss Eyes: Doesn't report blurriness of vision Ears, nose, mouth, throat, and face: Doesn't report sore throat Respiratory: Doesn't report cough, dyspnea or wheezes Cardiovascular: Doesn't report palpitation, chest discomfort  Gastrointestinal:  Doesn't report nausea, constipation, diarrhea GU: Doesn't report incontinence Skin: Doesn't report skin rashes Neurological: Per  HPI Musculoskeletal: Doesn't report joint pain Behavioral/Psych: Doesn't report anxiety  Physical Exam: Vitals:   05/22/22 1014  BP: 115/65  Pulse: 71  Resp: 16  Temp: 98.1 F (36.7 C)  SpO2: 97%   KPS: 60. General: Alert, cooperative, pleasant, in no acute distress Head: Normal EENT: No conjunctival injection or scleral icterus.  Lungs: Resp effort normal Cardiac: Regular rate Abdomen: Non-distended abdomen Skin: No rashes cyanosis or petechiae. Extremities: No clubbing or edema  Neurologic Exam: Mental Status: Awake, alert, attentive to examiner. Oriented to self and environment. Language is notable for impaired fluency, impaired comprehension to multistep commands Cranial Nerves: Visual acuity is grossly normal. Visual fields are full. Extra-ocular movements intact. No ptosis. Face is symmetric Motor: Tone and bulk are normal. Power is 4+/5 in right arm and leg. Reflexes are symmetric, no pathologic reflexes present.  Sensory: Intact to light touch Gait: +dystaxic   Labs: I have reviewed the data as listed    Component Value Date/Time   NA 143 05/08/2022 1019   NA 143 02/13/2020 1037   K 3.9 05/08/2022 1019   CL 107 05/08/2022 1019   CO2 30 05/08/2022 1019   GLUCOSE 134 (H) 05/08/2022 1019   BUN 21 05/08/2022 1019   BUN 13 02/13/2020 1037   CREATININE 1.44 (H) 05/08/2022 1019   CALCIUM 9.2 05/08/2022 1019   PROT 6.2 (L) 05/08/2022 1019   PROT 6.7 02/13/2020 1037   ALBUMIN 3.8 05/08/2022 1019   ALBUMIN 4.3 02/13/2020 1037   AST 14 (L) 05/08/2022 1019   ALT 24 05/08/2022 1019   ALKPHOS 69 05/08/2022 1019   BILITOT 0.5 05/08/2022 1019   GFRNONAA 51 (L) 05/08/2022 2297   GFRAA DUPLICATE 98/92/1194 1740   Lab Results  Component Value Date   WBC 4.5 05/22/2022   NEUTROABS 2.5 05/22/2022   HGB 14.5 05/22/2022   HCT 41.5 05/22/2022   MCV 91.6 05/22/2022   PLT 239 05/22/2022     Assessment/Plan Frontal glioblastoma multiforme (HCC) [C71.1]  Mathew Leach displays modest clinical decline today, now having initiated salvage therapy with metronomic Temodar and avastin.  The first infusion did not lead to the hoped for improvements.    We discussed goals of care at bedside today; he and his wife will discuss hospice and get back to [place].  In the  meantime, he will move forward with avastin treatment for today.  We did recommend holding off on further cytotoxic chemotherapy pending goals of care discussion, clinical progression.  We recommended proceeding with Avastin 13m/kg IV q2 weeks.  The patient will have a CBC, CMP, urine protein performed on days 14 and 28 of each cycle.  Labs may need to be performed more often. Zofran will prescribed for home use for nausea/vomiting.   Chemotherapy should be held for the following:  ANC less than 1,000  Platelets less than 100,000  LFT or creatinine greater than 2x ULN  If clinical concerns/contraindications develop  Avastin should be held for the following:  ANC less than 500  Platelets less than 50,000  LFT or creatinine greater than 2x ULN  If clinical concerns/contraindications develop  Keppra will continue at 15019mBID, Vimpat 10037mID.    Renal cell carcinoma will con't to be monitored by Dr. EnnMarin Olpurrent plan is for observation only.    Pending goals of care evaluation, we ask that Mathew Phlegmturn to clinic in 2 weeks for next avastin, or sooner as needed.    All questions were answered. The patient knows to call the clinic with any problems, questions or concerns. No barriers to learning were detected.  I have spent a total of 30 minutes of face-to-face and non-face-to-face time, excluding clinical staff time, preparing to see patient, ordering tests and/or medications, counseling the patient, and independently interpreting results and communicating results to the patient/family/caregiver    ZacVentura SellersD Medical Director of Neuro-Oncology [facility] WesBrushy/06/23 10:24 AM

## 2022-05-23 ENCOUNTER — Telehealth: Payer: Self-pay | Admitting: Internal Medicine

## 2022-05-23 NOTE — Telephone Encounter (Signed)
Per 7/6 los called and spoke to pt wife about appointment in 2 weeks.  She confirmed appointment

## 2022-05-27 ENCOUNTER — Other Ambulatory Visit (HOSPITAL_COMMUNITY): Payer: Self-pay

## 2022-05-28 ENCOUNTER — Other Ambulatory Visit (HOSPITAL_COMMUNITY): Payer: Self-pay

## 2022-05-29 ENCOUNTER — Other Ambulatory Visit (HOSPITAL_COMMUNITY): Payer: Self-pay

## 2022-05-29 ENCOUNTER — Other Ambulatory Visit: Payer: Self-pay | Admitting: Internal Medicine

## 2022-05-29 NOTE — Telephone Encounter (Signed)
Confirmed with patient's wife that patient is no longer on this medication.  Refill not appropriate.

## 2022-06-05 ENCOUNTER — Inpatient Hospital Stay: Payer: Medicare Other

## 2022-06-05 ENCOUNTER — Inpatient Hospital Stay (HOSPITAL_BASED_OUTPATIENT_CLINIC_OR_DEPARTMENT_OTHER): Payer: Medicare Other | Admitting: Internal Medicine

## 2022-06-05 ENCOUNTER — Other Ambulatory Visit: Payer: Self-pay

## 2022-06-05 VITALS — BP 131/82 | HR 79 | Temp 97.7°F | Resp 16 | Ht 68.0 in | Wt 155.9 lb

## 2022-06-05 DIAGNOSIS — C711 Malignant neoplasm of frontal lobe: Secondary | ICD-10-CM

## 2022-06-05 DIAGNOSIS — R569 Unspecified convulsions: Secondary | ICD-10-CM

## 2022-06-05 DIAGNOSIS — Z79899 Other long term (current) drug therapy: Secondary | ICD-10-CM | POA: Diagnosis not present

## 2022-06-05 DIAGNOSIS — Z5112 Encounter for antineoplastic immunotherapy: Secondary | ICD-10-CM | POA: Diagnosis not present

## 2022-06-05 DIAGNOSIS — Z7189 Other specified counseling: Secondary | ICD-10-CM

## 2022-06-05 LAB — CBC WITH DIFFERENTIAL (CANCER CENTER ONLY)
Abs Immature Granulocytes: 0.04 10*3/uL (ref 0.00–0.07)
Basophils Absolute: 0.1 10*3/uL (ref 0.0–0.1)
Basophils Relative: 3 %
Eosinophils Absolute: 0.3 10*3/uL (ref 0.0–0.5)
Eosinophils Relative: 5 %
HCT: 42.8 % (ref 39.0–52.0)
Hemoglobin: 14.8 g/dL (ref 13.0–17.0)
Immature Granulocytes: 1 %
Lymphocytes Relative: 26 %
Lymphs Abs: 1.2 10*3/uL (ref 0.7–4.0)
MCH: 31.6 pg (ref 26.0–34.0)
MCHC: 34.6 g/dL (ref 30.0–36.0)
MCV: 91.5 fL (ref 80.0–100.0)
Monocytes Absolute: 0.6 10*3/uL (ref 0.1–1.0)
Monocytes Relative: 13 %
Neutro Abs: 2.4 10*3/uL (ref 1.7–7.7)
Neutrophils Relative %: 52 %
Platelet Count: 291 10*3/uL (ref 150–400)
RBC: 4.68 MIL/uL (ref 4.22–5.81)
RDW: 13.2 % (ref 11.5–15.5)
WBC Count: 4.7 10*3/uL (ref 4.0–10.5)
nRBC: 0 % (ref 0.0–0.2)

## 2022-06-05 LAB — CMP (CANCER CENTER ONLY)
ALT: 19 U/L (ref 0–44)
AST: 22 U/L (ref 15–41)
Albumin: 4.1 g/dL (ref 3.5–5.0)
Alkaline Phosphatase: 80 U/L (ref 38–126)
Anion gap: 7 (ref 5–15)
BUN: 12 mg/dL (ref 8–23)
CO2: 26 mmol/L (ref 22–32)
Calcium: 9.4 mg/dL (ref 8.9–10.3)
Chloride: 110 mmol/L (ref 98–111)
Creatinine: 1.37 mg/dL — ABNORMAL HIGH (ref 0.61–1.24)
GFR, Estimated: 54 mL/min — ABNORMAL LOW (ref >60)
Glucose, Bld: 141 mg/dL — ABNORMAL HIGH (ref 70–99)
Potassium: 3.7 mmol/L (ref 3.5–5.1)
Sodium: 143 mmol/L (ref 135–145)
Total Bilirubin: 0.5 mg/dL (ref 0.3–1.2)
Total Protein: 6.6 g/dL (ref 6.5–8.1)

## 2022-06-05 LAB — TOTAL PROTEIN, URINE DIPSTICK: Protein, ur: <30 mg/dL — AB

## 2022-06-05 MED ORDER — SODIUM CHLORIDE 0.9 % IV SOLN: Freq: Once | INTRAVENOUS | Status: AC

## 2022-06-05 MED ORDER — SODIUM CHLORIDE 0.9 % IV SOLN
10.0000 mg/kg | Freq: Once | INTRAVENOUS | Status: AC
  Administered 2022-06-05: 700 mg via INTRAVENOUS
  Filled 2022-06-05: qty 16

## 2022-06-05 NOTE — Patient Instructions (Signed)
[facility] MEDICAL [facility]COLOGY  Discharge Instructions: Thank you for choosing [facility] to provide your oncology and hematology care.   If you have a lab appointment with the Cancer Center, please go directly to the Cancer Center and check in at the registration area.   Wear comfortable clothing and clothing appropriate for easy access to any Portacath or PICC line.   We strive to give you quality time with your provider. You may need to reschedule your appointment if you arrive late (15 or more minutes).  Arriving late affects you and other patients whose appointments are after yours.  Also, if you miss three or more appointments without notifying the office, you may be dismissed from the clinic at the provider's discretion.      For prescription refill requests, have your pharmacy contact our office and allow 72 hours for refills to be completed.    Today you received the following chemotherapy and/or immunotherapy agents: Bevacizumab      To help prevent nausea and vomiting after your treatment, we encourage you to take your nausea medication as directed.  BELOW ARE SYMPTOMS THAT SHOULD BE REPORTED IMMEDIATELY: *FEVER GREATER THAN 100.4 F (38 C) OR HIGHER *CHILLS OR SWEATING *NAUSEA AND VOMITING THAT IS NOT CONTROLLED WITH YOUR NAUSEA MEDICATION *UNUSUAL SHORTNESS OF BREATH *UNUSUAL BRUISING OR BLEEDING *URINARY PROBLEMS (pain or burning when urinating, or frequent urination) *BOWEL PROBLEMS (unusual diarrhea, constipation, pain near the anus) TENDERNESS IN MOUTH AND THROAT WITH OR WITHOUT PRESENCE OF ULCERS (sore throat, sores in mouth, or a toothache) UNUSUAL RASH, SWELLING OR PAIN  UNUSUAL VAGINAL DISCHARGE OR ITCHING   Items with * indicate a potential emergency and should be followed up as soon as possible or go to the Emergency Department if any problems should occur.  Please show the CHEMOTHERAPY ALERT CARD or IMMUNOTHERAPY ALERT CARD at check-in  to the Emergency Department and triage nurse.  Should you have questions after your visit or need to cancel or reschedule your appointment, please contact [facility]  Dept: [phone]  and follow the prompts.  Office hours are 8:00 a.m. to 4:30 p.m. Monday - Friday. Please note that voicemails left after 4:00 p.m. may not be returned until the following business day.  We are closed weekends and major holidays. You have access to a nurse at all times for urgent questions. Please call the main number to the clinic Dept: [phone] and follow the prompts.   For any non-urgent questions, you may also contact your provider using MyChart. We now offer e-Visits for anyone 18 and older to request care online for non-urgent symptoms. For details visit [email or web address].   Also download the MyChart app! Go to the app store, search "MyChart", open the app, select [facility], and log in with your MyChart username and password.  Masks are optional in the cancer centers. If you would like for your care team to wear a mask while they are taking care of you, please let them know. For doctor visits, patients may have with them one support [facility] who is at least 73 years old. At this time, visitors are not allowed in the infusion area. 

## 2022-06-05 NOTE — Progress Notes (Signed)
[facility] at [address], [place] [address] [phone]   Interval Evaluation  Date of Service: 06/05/22 Patient Name: Mathew Leach Patient MRN: [id number] Patient DOB: Mar 10, 1951 Provider: Ventura Sellers, MD  Identifying Statement:  Mathew Leach is a 71 y.o. male with left frontal glioblastoma   Referring Provider:  Oncologic History: Oncology History  Frontal glioblastoma multiforme ([place])  09/18/2020 Surgery   Left frontal craniotomy, resection by Dr. Kathyrn Sheriff; path demonstrates Glioblastaoma IDH-wt   10/17/2020 - 11/06/2020 Radiation Therapy   Completes abbreviated 15 fraction IMRT w/ concurrent Temodar      Chemotherapy   Patient is on Treatment Plan :  BRAIN GLIOBLASTOMA Post XRT Temozolomide Days 1-5 q28 Days      12/31/2021 Progression   Progression of disease.  Plan to resume 5-day Temodar.   02/18/2022 Progression   Progression of disease confirmed on 1 month f/u MRI   03/13/2022 - 03/13/2022 Chemotherapy   Patient is on Treatment Plan : BRAIN Lomustine q42d     05/04/2022 -  Chemotherapy   Patient is on Treatment Plan : BRAIN GLIOBLASTOMA Consolidation Temozolomide Days 1-5 q28 Days      05/08/2022 -  Chemotherapy   Patient is on Treatment Plan : BRAIN GBM Bevacizumab 14d x 6 cycles       Biomarkers:  MGMT Unknown.  IDH 1/2 Wild type.  EGFR Unknown  TERT Unknown   Interval History: Mathew Leach presents today for avastin infusion.  No further decline noted since the last treatment was administered.  No longer dosing the Temodar as instructed.  Left sided weakness, clumsiness is stable, he is doing a bit of independent walking at home past week or so.  In a wheelchair today.  Short term memory has declined. Currently on Keppra 1520m BID and Vimpat 1057mBID without further seizures.  Not on decadron currently.  H+P (10/04/20)  Mathew Phlegmresented to medical attention three  weeks ago with new onset generalized seizure.  This occurred at work, unwitnessed, and then second event was witnessed by EMS described as generalized shaking.  CNS imaging demonstrated left frontal mass lesion, which was resected by Dr. NuKathyrn Sheriffn 09/18/20.  Following surgery, he had no clinical complaints.  Workup did also demonstrate mass within kidney possibly c/w renal cell carcinoma.  He presents today for path review; he is fully independent for age, lives with his wife.   Medications: Current Outpatient Medications on File Prior to Visit  Medication Sig Dispense Refill   amLODipine (NORVASC) 10 MG tablet Take 10 mg by mouth daily.     apixaban (ELIQUIS) 5 MG TABS tablet Take 1 tablet (5 mg total) by mouth 2 (two) times daily. START this prescription after you have completed the starter pack of apixaban. 60 tablet 3   Lacosamide 100 MG TABS Take 1 tablet (100 mg total) by mouth 2 (two) times daily. 60 tablet 3   levETIRAcetam (KEPPRA) 750 MG tablet Take 2 tablets (1,500 mg total) by mouth 2 (two) times daily. 90 tablet 0   loperamide (IMODIUM) 2 MG capsule Take 2 mg by mouth daily as needed for diarrhea or loose stools.     LORazepam (ATIVAN) 1 MG tablet Take 1 tablet (1 mg total) by mouth every 8 (eight) hours as needed for seizure. 10 tablet 0   ondansetron (ZOFRAN) 8 MG tablet Take 1 tablet (8 mg total) by mouth 2 (two) times daily as needed (nausea  and vomiting). May take 30-60 minutes prior to Temodar administration if nausea/vomiting occurs. 30 tablet 1   simvastatin (ZOCOR) 20 MG tablet Take 1 tablet (20 mg total) by mouth daily. (Patient taking differently: Take 20 mg by mouth at bedtime.) 90 tablet 3   No current facility-administered medications on file prior to visit.    Allergies:  Allergies  Allergen Reactions   Lipitor [Atorvastatin] Other (See Comments)    Caused DOUBLE VISION    Past Medical History:  Past Medical History:  Diagnosis Date   Actinic keratosis     scalp   Arthritis    ASD (atrial septal defect) 06/10/2015   Atrial flutter, paroxysmal ([place]) 08/01/2015   Cancer of right kidney ([place]) 06/05/2021   Chronic anticoagulation 08/29/2015   Chronic pain of right knee 12/07/2014   Dysplastic nevus 01/11/2015   upper back spinal   Dysplastic nevus 01/29/2017   right superior calf inferior to popliteal   Dysrhythmia    AFIB   Goals of care, counseling/discussion 09/17/2020   Hypertension    Kidney stones    Obstructive sleep apnea syndrome 06/26/2015   PAF (paroxysmal atrial fibrillation) ([place]) 07/30/2015   Pericarditis    1999   PFO (patent foramen ovale) 06/16/2018   Preoperative cardiovascular examination 02/03/2016   S/P TKR (total knee replacement) using cement, 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   Shortness of breath dyspnea    W/ EXERTION    Sleep apnea    CPAP   Stroke ([place])    04-2015   Past Surgical History:  Past Surgical History:  Procedure Laterality Date   APPLICATION OF CRANIAL NAVIGATION Left 09/18/2020   Procedure: APPLICATION OF CRANIAL NAVIGATION;  Surgeon: Consuella Lose, MD;  Location: [facility];  Service: Neurosurgery;  Laterality: Left;   COLONOSCOPY  04/10/2011   Moderate predominantly sigmoid diverticulosis. Small internal hemorrhoids.    COLONOSCOPY  01/30/2020   CRANIOTOMY Left 09/18/2020   Procedure: LEFT FRONTAL CRANIOTOMY FOR  TUMOR;  Surgeon: Consuella Lose, MD;  Location: [facility];  Service: Neurosurgery;  Laterality: Left;  left   CYST REMOVAL NECK     AGE 44   ESOPHAGOGASTRODUODENOSCOPY  04/10/2011   Erosive esophagitis with esophageal stricture (asymptomatic strictures since the patient not having any dysphagia). LA grade D esophagitis.   INGUINAL HERNIA REPAIR     LEFT AS CHILD   ROBOTIC ASSITED PARTIAL NEPHRECTOMY Right 08/28/2021   Procedure: XI ROBOTIC ASSISTED PARTIAL NEPHRECTOMY AND RENAL CYST DECORTICATION WITH INTRAOPERTATIVE ULTRASOUND;  Surgeon: Alexis Frock, MD;  Location: [facility];  Service: Urology;   Laterality: Right;  3 HRS   TOTAL KNEE ARTHROPLASTY Right 03/17/2016   Procedure: TOTAL KNEE ARTHROPLASTY;  Surgeon: [name], MD;  Location: [facility];  Service: Orthopedics;  Laterality: Right;   watchman procedure  07/2019   no longer needs Eliquis   Social History:  Social History   Socioeconomic History   Marital status: Married    Spouse name: Not on file   Number of children: Not on file   Years of education: Not on file   Highest education level: Not on file  Occupational History   Not on file  Tobacco Use   Smoking status: Never   Smokeless tobacco: Never  Vaping Use   Vaping Use: Never used  Substance and Sexual Activity   Alcohol use: No   Drug use: No   Sexual activity: Not on file  Other Topics Concern   Not on file  Social History Narrative   Not on  file   Social Determinants of Health   Financial Resource Strain: Not on file  Food Insecurity: Not on file  Transportation Needs: Not on file  Physical Activity: Not on file  Stress: Not on file  Social Connections: Not on file  Intimate Partner Violence: Not on file   Family History:  Family History  Problem Relation Age of Onset   Lung cancer Father    Colon cancer Neg Hx    Esophageal cancer Neg Hx    Colon polyps Neg Hx    Rectal cancer Neg Hx    Stomach cancer Neg Hx     Review of Systems: Constitutional: Doesn't report fevers, chills or abnormal weight loss Eyes: Doesn't report blurriness of vision Ears, nose, mouth, throat, and face: Doesn't report sore throat Respiratory: Doesn't report cough, dyspnea or wheezes Cardiovascular: Doesn't report palpitation, chest discomfort  Gastrointestinal:  Doesn't report nausea, constipation, diarrhea GU: Doesn't report incontinence Skin: Doesn't report skin rashes Neurological: Per HPI Musculoskeletal: Doesn't report joint pain Behavioral/Psych: Doesn't report anxiety  Physical Exam: Vitals:   06/05/22 1107  BP: 131/82  Pulse: 79  Resp: 16   Temp: 97.7 F (36.5 C)  SpO2: 99%   KPS: 60. General: Alert, cooperative, pleasant, in no acute distress Head: Normal EENT: No conjunctival injection or scleral icterus.  Lungs: Resp effort normal Cardiac: Regular rate Abdomen: Non-distended abdomen Skin: No rashes cyanosis or petechiae. Extremities: No clubbing or edema  Neurologic Exam: Mental Status: Awake, alert, attentive to examiner. Oriented to self and environment. Language is notable for impaired fluency, impaired comprehension to multistep commands Cranial Nerves: Visual acuity is grossly normal. Visual fields are full. Extra-ocular movements intact. No ptosis. Face is symmetric Motor: Tone and bulk are normal. Power is 4+/5 in right arm and leg. Reflexes are symmetric, no pathologic reflexes present.  Sensory: Intact to light touch Gait: +dystaxic   Labs: I have reviewed the data as listed    Component Value Date/Time   NA 143 05/22/2022 0955   NA 143 02/13/2020 1037   K 3.6 05/22/2022 0955   CL 108 05/22/2022 0955   CO2 28 05/22/2022 0955   GLUCOSE 127 (H) 05/22/2022 0955   BUN 12 05/22/2022 0955   BUN 13 02/13/2020 1037   CREATININE 1.47 (H) 05/22/2022 0955   CALCIUM 9.2 05/22/2022 0955   PROT 6.5 05/22/2022 0955   PROT 6.7 02/13/2020 1037   ALBUMIN 3.9 05/22/2022 0955   ALBUMIN 4.3 02/13/2020 1037   AST 21 05/22/2022 0955   ALT 23 05/22/2022 0955   ALKPHOS 86 05/22/2022 0955   BILITOT 0.5 05/22/2022 0955   GFRNONAA 50 (L) 05/22/2022 2229   GFRAA DUPLICATE 79/89/2119 4174   Lab Results  Component Value Date   WBC 4.7 06/05/2022   NEUTROABS 2.4 06/05/2022   HGB 14.8 06/05/2022   HCT 42.8 06/05/2022   MCV 91.5 06/05/2022   PLT 291 06/05/2022     Assessment/Plan Frontal glioblastoma multiforme (HCC) [C71.1]  Mathew Leach is clinically stable today.  Labs WNL.  We recommended proceeding with Avastin 54m/kg IV q2 weeks.  The patient will have a CBC, CMP, urine protein performed on days  14 and 28 of each cycle.  Labs may need to be performed more often. Zofran will prescribed for home use for nausea/vomiting.   Avastin should be held for the following:  ANC less than 500  Platelets less than 50,000  LFT or creatinine greater than 2x ULN  If clinical concerns/contraindications develop  Keppra will continue at 1549m BID, Vimpat 1076mBID.    Renal cell carcinoma will con't to be monitored by Dr. EnMarin Olpcurrent plan is for observation only.    Pending goals of care evaluation, we ask that Mathew Phlegmeturn to clinic in 3 weeks for next avastin, or sooner as needed.  MRI brain will be scheduled for 07/04/22.  All questions were answered. The patient knows to call the clinic with any problems, questions or concerns. No barriers to learning were detected.  I have spent a total of 30 minutes of face-to-face and non-face-to-face time, excluding clinical staff time, preparing to see patient, ordering tests and/or medications, counseling the patient, and independently interpreting results and communicating results to the patient/family/caregiver    ZaVentura SellersMD Medical Director of Neuro-Oncology [facility] We[facility]7/20/23 11:13 AM

## 2022-06-06 LAB — PHENYTOIN LEVEL, FREE AND TOTAL
Phenytoin, Free: NOT DETECTED ug/mL (ref 1.0–2.0)
Phenytoin, Total: <0.8 ug/mL — ABNORMAL LOW (ref 10.0–20.0)

## 2022-06-09 ENCOUNTER — Other Ambulatory Visit: Payer: Self-pay

## 2022-06-16 ENCOUNTER — Other Ambulatory Visit: Payer: Self-pay | Admitting: Radiation Therapy

## 2022-06-16 ENCOUNTER — Other Ambulatory Visit: Payer: Self-pay

## 2022-06-17 ENCOUNTER — Other Ambulatory Visit: Payer: Self-pay

## 2022-06-17 DIAGNOSIS — I2699 Other pulmonary embolism without acute cor pulmonale: Secondary | ICD-10-CM | POA: Diagnosis not present

## 2022-06-17 DIAGNOSIS — C719 Malignant neoplasm of brain, unspecified: Secondary | ICD-10-CM | POA: Diagnosis not present

## 2022-06-19 ENCOUNTER — Other Ambulatory Visit: Payer: Self-pay | Admitting: *Deleted

## 2022-06-19 NOTE — Progress Notes (Signed)
Wife called to report that she is going to have a visit with Hospice to get some questions answered.  She isn't sure if she is ready for that yet.

## 2022-06-22 ENCOUNTER — Other Ambulatory Visit: Payer: Self-pay

## 2022-06-26 ENCOUNTER — Inpatient Hospital Stay (HOSPITAL_BASED_OUTPATIENT_CLINIC_OR_DEPARTMENT_OTHER): Payer: Medicare Other | Admitting: Internal Medicine

## 2022-06-26 ENCOUNTER — Inpatient Hospital Stay: Payer: Medicare Other | Attending: Internal Medicine

## 2022-06-26 ENCOUNTER — Other Ambulatory Visit: Payer: Self-pay

## 2022-06-26 ENCOUNTER — Inpatient Hospital Stay: Payer: Medicare Other

## 2022-06-26 VITALS — BP 132/67 | HR 70 | Temp 98.1°F | Resp 17

## 2022-06-26 VITALS — BP 126/74 | HR 76 | Temp 97.9°F | Resp 18 | Ht 68.0 in | Wt 156.4 lb

## 2022-06-26 DIAGNOSIS — C711 Malignant neoplasm of frontal lobe: Secondary | ICD-10-CM

## 2022-06-26 DIAGNOSIS — Z79899 Other long term (current) drug therapy: Secondary | ICD-10-CM | POA: Insufficient documentation

## 2022-06-26 DIAGNOSIS — R569 Unspecified convulsions: Secondary | ICD-10-CM

## 2022-06-26 DIAGNOSIS — Z5112 Encounter for antineoplastic immunotherapy: Secondary | ICD-10-CM | POA: Diagnosis not present

## 2022-06-26 LAB — CBC WITH DIFFERENTIAL (CANCER CENTER ONLY)
Abs Immature Granulocytes: 0.01 10*3/uL (ref 0.00–0.07)
Basophils Absolute: 0.1 10*3/uL (ref 0.0–0.1)
Basophils Relative: 2 %
Eosinophils Absolute: 0.4 10*3/uL (ref 0.0–0.5)
Eosinophils Relative: 8 %
HCT: 44 % (ref 39.0–52.0)
Hemoglobin: 15.4 g/dL (ref 13.0–17.0)
Immature Granulocytes: 0 %
Lymphocytes Relative: 24 %
Lymphs Abs: 1.1 10*3/uL (ref 0.7–4.0)
MCH: 31.9 pg (ref 26.0–34.0)
MCHC: 35 g/dL (ref 30.0–36.0)
MCV: 91.1 fL (ref 80.0–100.0)
Monocytes Absolute: 0.5 10*3/uL (ref 0.1–1.0)
Monocytes Relative: 10 %
Neutro Abs: 2.6 10*3/uL (ref 1.7–7.7)
Neutrophils Relative %: 56 %
Platelet Count: 221 10*3/uL (ref 150–400)
RBC: 4.83 MIL/uL (ref 4.22–5.81)
RDW: 12.6 % (ref 11.5–15.5)
WBC Count: 4.6 10*3/uL (ref 4.0–10.5)
nRBC: 0 % (ref 0.0–0.2)

## 2022-06-26 LAB — CMP (CANCER CENTER ONLY)
ALT: 15 U/L (ref 0–44)
AST: 20 U/L (ref 15–41)
Albumin: 4.1 g/dL (ref 3.5–5.0)
Alkaline Phosphatase: 74 U/L (ref 38–126)
Anion gap: 8 (ref 5–15)
BUN: 10 mg/dL (ref 8–23)
CO2: 25 mmol/L (ref 22–32)
Calcium: 9.3 mg/dL (ref 8.9–10.3)
Chloride: 109 mmol/L (ref 98–111)
Creatinine: 1.32 mg/dL — ABNORMAL HIGH (ref 0.61–1.24)
GFR, Estimated: 57 mL/min — ABNORMAL LOW (ref >60)
Glucose, Bld: 150 mg/dL — ABNORMAL HIGH (ref 70–99)
Potassium: 3.5 mmol/L (ref 3.5–5.1)
Sodium: 142 mmol/L (ref 135–145)
Total Bilirubin: 0.7 mg/dL (ref 0.3–1.2)
Total Protein: 6.9 g/dL (ref 6.5–8.1)

## 2022-06-26 LAB — TOTAL PROTEIN, URINE DIPSTICK: Protein, ur: <30 mg/dL

## 2022-06-26 MED ORDER — SODIUM CHLORIDE 0.9 % IV SOLN: Freq: Once | INTRAVENOUS | Status: AC

## 2022-06-26 MED ORDER — SODIUM CHLORIDE 0.9 % IV SOLN
10.0000 mg/kg | Freq: Once | INTRAVENOUS | Status: AC
  Administered 2022-06-26: 700 mg via INTRAVENOUS
  Filled 2022-06-26: qty 16

## 2022-06-26 NOTE — Progress Notes (Signed)
[facility] at [address], [place] [address] [phone]   Interval Evaluation  Date of Service: 06/26/22 Patient Name: Mathew Leach Patient MRN: [id number] Patient DOB: [birth year] Provider: Ventura Sellers, MD  Identifying Statement:  Mathew Leach is a 71 y.o. male with left frontal glioblastoma   Referring Provider:  Oncologic History: Oncology History  Frontal glioblastoma multiforme ([place])  09/18/2020 Surgery   Left frontal craniotomy, resection by Dr. Kathyrn Leach; path demonstrates Glioblastaoma IDH-wt   10/17/2020 - 11/06/2020 Radiation Therapy   Completes abbreviated 15 fraction IMRT w/ concurrent Temodar      Chemotherapy   Patient is on Treatment Plan :  BRAIN GLIOBLASTOMA Post XRT Temozolomide Days 1-5 q28 Days      12/31/2021 Progression   Progression of disease.  Plan to resume 5-day Temodar.   02/18/2022 Progression   Progression of disease confirmed on 1 month f/u MRI   03/13/2022 - 03/13/2022 Chemotherapy   Patient is on Treatment Plan : BRAIN Lomustine q42d     05/04/2022 -  Chemotherapy   Patient is on Treatment Plan : BRAIN GLIOBLASTOMA Consolidation Temozolomide Days 1-5 q28 Days      05/08/2022 -  Chemotherapy   Patient is on Treatment Plan : BRAIN GBM Bevacizumab 14d x 6 cycles       Biomarkers:  MGMT Unknown.  IDH 1/2 Wild type.  EGFR Unknown  TERT Unknown   Interval History: Mathew Leach presents today for avastin infusion.  Wife describes increase in overall weakess, fatigue.  Short term memory and cognition is worse.  He is now requiring assistance with all ADLs including toileting.  Currently on Keppra 156m BID and Vimpat 1048mBID without further seizures.  Not on decadron currently.  H+P (10/04/20)  [name] to medical attention three weeks ago with new onset generalized seizure.  This occurred at work, unwitnessed, and then second event was witnessed  by EMS described as generalized shaking.  CNS imaging demonstrated left frontal mass lesion, which was resected by Dr. NuKathyrn Sheriffn 09/18/20.  Following surgery, he had no clinical complaints.  Workup did also demonstrate mass within kidney possibly c/w renal cell carcinoma.  He presents today for path review; he is fully independent for age, lives with his wife.   Medications: Current Outpatient Medications on File Prior to Visit  Medication Sig Dispense Refill   amLODipine (NORVASC) 10 MG tablet Take 10 mg by mouth daily.     apixaban (ELIQUIS) 5 MG TABS tablet Take 1 tablet (5 mg total) by mouth 2 (two) times daily. START this prescription after you have completed the starter pack of apixaban. 60 tablet 3   Lacosamide 100 MG TABS Take 1 tablet (100 mg total) by mouth 2 (two) times daily. 60 tablet 3   levETIRAcetam (KEPPRA) 750 MG tablet Take 2 tablets (1,500 mg total) by mouth 2 (two) times daily. 90 tablet 0   loperamide (IMODIUM) 2 MG capsule Take 2 mg by mouth daily as needed for diarrhea or loose stools.     LORazepam (ATIVAN) 1 MG tablet Take 1 tablet (1 mg total) by mouth every 8 (eight) hours as needed for seizure. (Patient not taking: Reported on 06/05/2022) 10 tablet 0   ondansetron (ZOFRAN) 8 MG tablet Take 1 tablet (8 mg total) by mouth 2 (two) times daily as needed (nausea and vomiting). May take 30-60 minutes prior to Temodar administration if nausea/vomiting occurs. 30 tablet 1  simvastatin (ZOCOR) 20 MG tablet Take 1 tablet (20 mg total) by mouth daily. (Patient taking differently: Take 20 mg by mouth at bedtime.) 90 tablet 3   No current facility-administered medications on file prior to visit.    Allergies:  Allergies  Allergen Reactions   Lipitor [Atorvastatin] Other (See Comments)    Caused DOUBLE VISION    Past Medical History:  Past Medical History:  Diagnosis Date   Actinic keratosis    scalp   Arthritis    ASD (atrial septal defect) 06/10/2015   Atrial flutter,  paroxysmal ([place]) 08/01/2015   Cancer of right kidney ([facility]) 06/05/2021   Chronic anticoagulation 08/29/2015   Chronic pain of right knee 12/07/2014   Dysplastic nevus 01/11/2015   upper back spinal   Dysplastic nevus 01/29/2017   right superior calf inferior to popliteal   Dysrhythmia    AFIB   Goals of care, counseling/discussion 09/17/2020   Hypertension    Kidney stones    Obstructive sleep apnea syndrome 06/26/2015   PAF (paroxysmal atrial fibrillation) ([place]) 07/30/2015   Pericarditis    1999   PFO (patent foramen ovale) 06/16/2018   Preoperative cardiovascular examination 02/03/2016   S/P TKR (total knee replacement) using cement, 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   Shortness of breath dyspnea    W/ EXERTION    Sleep apnea    CPAP   Stroke ([place])    04-2015   Past Surgical History:  Past Surgical History:  Procedure Laterality Date   APPLICATION OF CRANIAL NAVIGATION Left 09/18/2020   Procedure: APPLICATION OF CRANIAL NAVIGATION;  Surgeon: Mathew Lose, MD;  Location: [facility];  Service: Neurosurgery;  Laterality: Left;   COLONOSCOPY  04/10/2011   Moderate predominantly sigmoid diverticulosis. Small internal hemorrhoids.    COLONOSCOPY  01/30/2020   CRANIOTOMY Left 09/18/2020   Procedure: LEFT FRONTAL CRANIOTOMY FOR  TUMOR;  Surgeon: Mathew Lose, MD;  Location: [facility];  Service: Neurosurgery;  Laterality: Left;  left   CYST REMOVAL NECK     AGE 72   ESOPHAGOGASTRODUODENOSCOPY  04/10/2011   Erosive esophagitis with esophageal stricture (asymptomatic strictures since the patient not having any dysphagia). LA grade D esophagitis.   INGUINAL HERNIA REPAIR     LEFT AS CHILD   ROBOTIC ASSITED PARTIAL NEPHRECTOMY Right 08/28/2021   Procedure: XI ROBOTIC ASSISTED PARTIAL NEPHRECTOMY AND RENAL CYST DECORTICATION WITH INTRAOPERTATIVE ULTRASOUND;  Surgeon: Mathew Frock, MD;  [facility];  Service: Urology;  Laterality: Right;  3 HRS   TOTAL KNEE ARTHROPLASTY Right 03/17/2016   Procedure:  TOTAL KNEE ARTHROPLASTY;  Surgeon: Mathew Huger, MD;  Location: [facility];  Service: Orthopedics;  Laterality: Right;   watchman procedure  07/2019   no longer needs Eliquis   Social History:  Social History   Socioeconomic History   Marital status: Married    Spouse name: Not on file   Number of children: Not on file   Years of education: Not on file   Highest education level: Not on file  Occupational History   Not on file  Tobacco Use   Smoking status: Never   Smokeless tobacco: Never  Vaping Use   Vaping Use: Never used  Substance and Sexual Activity   Alcohol use: No   Drug use: No   Sexual activity: Not on file  Other Topics Concern   Not on file  Social History Narrative   Not on file   Social Determinants of Health   Financial Resource Strain: Not on file  Food Insecurity:  Not on file  Transportation Needs: Not on file  Physical Activity: Not on file  Stress: Not on file  Social Connections: Not on file  Intimate Partner Violence: Not on file   Family History:  Family History  Problem Relation Age of Onset   Lung cancer Father    Colon cancer Neg Hx    Esophageal cancer Neg Hx    Colon polyps Neg Hx    Rectal cancer Neg Hx    Stomach cancer Neg Hx     Review of Systems: Constitutional: Doesn't report fevers, chills or abnormal weight loss Eyes: Doesn't report blurriness of vision Ears, nose, mouth, throat, and face: Doesn't report sore throat Respiratory: Doesn't report cough, dyspnea or wheezes Cardiovascular: Doesn't report palpitation, chest discomfort  Gastrointestinal:  Doesn't report nausea, constipation, diarrhea GU: Doesn't report incontinence Skin: Doesn't report skin rashes Neurological: Per HPI Musculoskeletal: Doesn't report joint pain Behavioral/Psych: Doesn't report anxiety  Physical Exam: Vitals:   06/26/22 0926  BP: 126/74  Pulse: 76  Resp: 18  Temp: 97.9 F (36.6 C)  SpO2: 97%   KPS: 60. General: Alert, cooperative,  pleasant, in no acute distress Head: Normal EENT: No conjunctival injection or scleral icterus.  Lungs: Resp effort normal Cardiac: Regular rate Abdomen: Non-distended abdomen Skin: No rashes cyanosis or petechiae. Extremities: No clubbing or edema  Neurologic Exam: Mental Status: Awake, alert, attentive to examiner. Oriented to self and environment. Language is notable for impaired fluency, impaired comprehension to multistep commands Cranial Nerves: Visual acuity is grossly normal. Visual fields are full. Extra-ocular movements intact. No ptosis. Face is symmetric Motor: Tone and bulk are normal. Power is 4+/5 in right arm and leg. Reflexes are symmetric, no pathologic reflexes present.  Sensory: Intact to light touch Gait: +dystaxic   Labs: I have reviewed the data as listed    Component Value Date/Time   NA 142 06/26/2022 0857   NA 143 02/13/2020 1037   K 3.5 06/26/2022 0857   CL 109 06/26/2022 0857   CO2 25 06/26/2022 0857   GLUCOSE 150 (H) 06/26/2022 0857   BUN 10 06/26/2022 0857   BUN 13 02/13/2020 1037   CREATININE 1.32 (H) 06/26/2022 0857   CALCIUM 9.3 06/26/2022 0857   PROT 6.9 06/26/2022 0857   PROT 6.7 02/13/2020 1037   ALBUMIN 4.1 06/26/2022 0857   ALBUMIN 4.3 02/13/2020 1037   AST 20 06/26/2022 0857   ALT 15 06/26/2022 0857   ALKPHOS 74 06/26/2022 0857   BILITOT 0.7 06/26/2022 0857   GFRNONAA 57 (L) 06/26/2022 7342   GFRAA DUPLICATE 87/68/1157 2620   Lab Results  Component Value Date   WBC 4.6 06/26/2022   NEUTROABS 2.6 06/26/2022   HGB 15.4 06/26/2022   HCT 44.0 06/26/2022   MCV 91.1 06/26/2022   PLT 221 06/26/2022     Assessment/Plan Frontal glioblastoma multiforme (HCC) [C71.1]  Mathew Leach presents today with further clinical progression.  Labs WNL.  We extensively discussed goals of care today given his decline.  Candidacy for hospice was reviewed.  At this time, patient would like to try one additional treatment and obtain MRI  study before making decision.    We therefore recommended proceeding with Avastin 96m/kg IV q2 weeks.  The patient will have a CBC, CMP, urine protein performed on days 14 and 28 of each cycle.  Labs may need to be performed more often. Zofran will prescribed for home use for nausea/vomiting.   Avastin should be held for the following:  ANC less than 500  Platelets less than 50,000  LFT or creatinine greater than 2x ULN  If clinical concerns/contraindications develop  Ok to defer urine protein today.  Keppra will continue at 1569m BID, Vimpat 1021mBID.    Renal cell carcinoma will con't to be monitored by Dr. EnMarin Olpcurrent plan is for observation only.    Pending goals of care evaluation, we ask that DaGardenia Phlegmeturn to clinic in 2 weeks with MRI brain for evaluation.  All questions were answered. The patient knows to call the clinic with any problems, questions or concerns. No barriers to learning were detected.  I have spent a total of 30 minutes of face-to-face and non-face-to-face time, excluding clinical staff time, preparing to see patient, ordering tests and/or medications, counseling the patient, and independently interpreting results and communicating results to the patient/family/caregiver    ZaVentura SellersMD Medical Director of Neuro-Oncology [facility] We[place]eta8/10/23 9:39 AM

## 2022-06-26 NOTE — Patient Instructions (Signed)
[facility]  Discharge Instructions: Thank you for choosing [facility] to provide your oncology and hematology care.   If you have a lab appointment with the [facility], please go directly to the [facility] and check in at the registration area.   Wear comfortable clothing and clothing appropriate for easy access to any Portacath or PICC line.   We strive to give you quality time with your provider. You may need to reschedule your appointment if you arrive late (15 or more minutes).  Arriving late affects you and other patients whose appointments are after yours.  Also, if you miss three or more appointments without notifying the office, you may be dismissed from the clinic at the provider's discretion.      For prescription refill requests, have your pharmacy contact our office and allow 72 hours for refills to be completed.    Today you received the following chemotherapy and/or immunotherapy agents: zirabev      To help prevent nausea and vomiting after your treatment, we encourage you to take your nausea medication as directed.  BELOW ARE SYMPTOMS THAT SHOULD BE REPORTED IMMEDIATELY: *FEVER GREATER THAN 100.4 F (38 C) OR HIGHER *CHILLS OR SWEATING *NAUSEA AND VOMITING THAT IS NOT CONTROLLED WITH YOUR NAUSEA MEDICATION *UNUSUAL SHORTNESS OF BREATH *UNUSUAL BRUISING OR BLEEDING *URINARY PROBLEMS (pain or burning when urinating, or frequent urination) *BOWEL PROBLEMS (unusual diarrhea, constipation, pain near the anus) TENDERNESS IN MOUTH AND THROAT WITH OR WITHOUT PRESENCE OF ULCERS (sore throat, sores in mouth, or a toothache) UNUSUAL RASH, SWELLING OR PAIN  UNUSUAL VAGINAL DISCHARGE OR ITCHING   Items with * indicate a potential emergency and should be followed up as soon as possible or go to the Emergency Department if any problems should occur.  Please show the CHEMOTHERAPY ALERT CARD or IMMUNOTHERAPY ALERT CARD at check-in to  the Emergency Department and triage nurse.  Should you have questions after your visit or need to cancel or reschedule your appointment, please contact [facility]  Dept: [phone]  and follow the prompts.  Office hours are 8:00 a.m. to 4:30 p.m. Monday - Friday. Please note that voicemails left after 4:00 p.m. may not be returned until the following business day.  We are closed weekends and major holidays. You have access to a nurse at all times for urgent questions. Please call the main number to the clinic Dept: [phone] and follow the prompts.   For any non-urgent questions, you may also contact your provider using MyChart. We now offer e-Visits for anyone 73 and older to request care online for non-urgent symptoms. For details visit [email or web address]   Also download the MyChart app! Go to the app store, search "MyChart", open the app, select [facility], and log in with your MyChart username and password.  Masks are optional in the cancer centers. If you would like for your care team to wear a mask while they are taking care of you, please let them know. You may have one support person who is at least 73 years old accompany you for your appointments.   Bevacizumab Injection What is this medication? BEVACIZUMAB (be va SIZ yoo mab) treats some types of cancer. It works by blocking a protein that causes cancer cells to grow and multiply. This helps to slow or stop the spread of cancer cells. It is a monoclonal antibody. This medicine may be used for other purposes; ask your health care provider  or pharmacist if you have questions. COMMON BRAND NAME(S): Alymsys, Avastin, MVASI, Noah Charon What should I tell my care team before I take this medication? They need to know if you have any of these conditions: Blood clots Coughing up blood Having or recent surgery Heart failure High blood pressure History of a connection between 2 or more body parts that do  not usually connect (fistula) History of a tear in your stomach or intestines Protein in your urine An unusual or allergic reaction to bevacizumab, other medications, foods, dyes, or preservatives Pregnant or trying to get pregnant Breast-feeding How should I use this medication? This medication is injected into a vein. It is given by your care team in a hospital or clinic setting. Talk to your care team the use of this medication in children. Special care may be needed. Overdosage: If you think you have taken too much of this medicine contact a poison control center or emergency room at once. NOTE: This medicine is only for you. Do not share this medicine with others. What if I miss a dose? Keep appointments for follow-up doses. It is important not to miss your dose. Call your care team if you are unable to keep an appointment. What may interact with this medication? Interactions are not expected. This list may not describe all possible interactions. Give your health care provider a list of all the medicines, herbs, non-prescription drugs, or dietary supplements you use. Also tell them if you smoke, drink alcohol, or use illegal drugs. Some items may interact with your medicine. What should I watch for while using this medication? Your condition will be monitored carefully while you are receiving this medication. You may need blood work while taking this medication. This medication may make you feel generally unwell. This is not uncommon as chemotherapy can affect healthy cells as well as cancer cells. Report any side effects. Continue your course of treatment even though you feel ill unless your care team tells you to stop. This medication may increase your risk to bruise or bleed. Call your care team if you notice any unusual bleeding. Before having surgery, talk to your care team to make sure it is ok. This medication can increase the risk of poor healing of your surgical site or wound. You  will need to stop this medication for 28 days before surgery. After surgery, wait at least 28 days before restarting this medication. Make sure the surgical site or wound is healed enough before restarting this medication. Talk to your care team if questions. Talk to your care team if you may be pregnant. Serious birth defects can occur if you take this medication during pregnancy and for 6 months after the last dose. Contraception is recommended while taking this medication and for 6 months after the last dose. Your care team can help you find the option that works for you. Do not breastfeed while taking this medication and for 6 months after the last dose. This medication can cause infertility. Talk to your care team if you are concerned about your fertility. What side effects may I notice from receiving this medication? Side effects that you should report to your care team as soon as possible: Allergic reactions--skin rash, itching, hives, swelling of the face, lips, tongue, or throat Bleeding--bloody or black, tar-like stools, vomiting blood or brown material that looks like coffee grounds, red or dark brown urine, small red or purple spots on skin, unusual bruising or bleeding Blood clot--pain, swelling, or warmth in the  leg, shortness of breath, chest pain Heart attack--pain or tightness in the chest, shoulders, arms, or jaw, nausea, shortness of breath, cold or clammy skin, feeling faint or lightheaded Heart failure--shortness of breath, swelling of the ankles, feet, or hands, sudden weight gain, unusual weakness or fatigue Increase in blood pressure Infection--fever, chills, cough, sore throat, wounds that don't heal, pain or trouble when passing urine, general feeling of discomfort or being unwell Infusion reactions--chest pain, shortness of breath or trouble breathing, feeling faint or lightheaded Kidney injury--decrease in the amount of urine, swelling of the ankles, hands, or feet Stomach  pain that is severe, does not go away, or gets worse Stroke--sudden numbness or weakness of the face, arm, or leg, trouble speaking, confusion, trouble walking, loss of balance or coordination, dizziness, severe headache, change in vision Sudden and severe headache, confusion, change in vision, seizures, which may be signs of posterior reversible encephalopathy syndrome (PRES) Side effects that usually do not require medical attention (report to your care team if they continue or are bothersome): Back pain Change in taste Diarrhea Dry skin Increased tears Nosebleed This list may not describe all possible side effects. Call your doctor for medical advice about side effects. You may report side effects to FDA at 1-800-FDA-1088. Where should I keep my medication? This medication is given in a hospital or clinic. It will not be stored at home. NOTE: This sheet is a summary. It may not cover all possible information. If you have questions about this medicine, talk to your doctor, pharmacist, or health care provider.  2023 Elsevier/Gold Standard (2022-03-18 00:00:00)

## 2022-06-26 NOTE — Progress Notes (Signed)
Okay to proceed with Bevacizumab without urine sample today per Dr. Mickeal Skinner.

## 2022-07-04 ENCOUNTER — Ambulatory Visit (HOSPITAL_COMMUNITY)
Admission: RE | Admit: 2022-07-04 | Discharge: 2022-07-04 | Disposition: A | Discharge status: Home / Self Care | Payer: Medicare Other | Source: Ambulatory Visit | Attending: Internal Medicine | Admitting: Internal Medicine

## 2022-07-04 DIAGNOSIS — Z9889 Other specified postprocedural states: Secondary | ICD-10-CM | POA: Diagnosis not present

## 2022-07-04 DIAGNOSIS — C719 Malignant neoplasm of brain, unspecified: Secondary | ICD-10-CM | POA: Diagnosis not present

## 2022-07-04 DIAGNOSIS — C711 Malignant neoplasm of frontal lobe: Secondary | ICD-10-CM | POA: Diagnosis not present

## 2022-07-04 MED ORDER — GADOBUTROL 1 MMOL/ML IV SOLN
7.0000 mL | Freq: Once | INTRAVENOUS | Status: AC | PRN
  Administered 2022-07-04: 7 mL via INTRAVENOUS

## 2022-07-07 ENCOUNTER — Inpatient Hospital Stay: Payer: Medicare Other

## 2022-07-09 ENCOUNTER — Telehealth: Payer: Self-pay

## 2022-07-09 NOTE — Telephone Encounter (Signed)
Mrs. Vreeland called to reschedule patient's apt  on 07/10/22. She is unable to bring pt to apt due to her knee injury. She states pt is doing well and is requesting that his apt be rescheduled until week of August 28th.  Scheduling message sent.

## 2022-07-10 ENCOUNTER — Inpatient Hospital Stay: Payer: Medicare Other

## 2022-07-10 ENCOUNTER — Inpatient Hospital Stay: Payer: Medicare Other | Admitting: Internal Medicine

## 2022-07-15 ENCOUNTER — Other Ambulatory Visit: Payer: Self-pay

## 2022-07-15 ENCOUNTER — Inpatient Hospital Stay: Payer: Medicare Other

## 2022-07-15 ENCOUNTER — Inpatient Hospital Stay (HOSPITAL_BASED_OUTPATIENT_CLINIC_OR_DEPARTMENT_OTHER): Payer: Medicare Other | Admitting: Internal Medicine

## 2022-07-15 VITALS — BP 134/75 | HR 76 | Temp 97.7°F | Resp 17 | Wt 156.5 lb

## 2022-07-15 DIAGNOSIS — Z5112 Encounter for antineoplastic immunotherapy: Secondary | ICD-10-CM | POA: Diagnosis not present

## 2022-07-15 DIAGNOSIS — Z79899 Other long term (current) drug therapy: Secondary | ICD-10-CM | POA: Diagnosis not present

## 2022-07-15 DIAGNOSIS — C711 Malignant neoplasm of frontal lobe: Secondary | ICD-10-CM

## 2022-07-15 DIAGNOSIS — Z7189 Other specified counseling: Secondary | ICD-10-CM

## 2022-07-15 LAB — CBC WITH DIFFERENTIAL (CANCER CENTER ONLY)
Abs Immature Granulocytes: 0.01 10*3/uL (ref 0.00–0.07)
Basophils Absolute: 0.1 10*3/uL (ref 0.0–0.1)
Basophils Relative: 2 %
Eosinophils Absolute: 0.2 10*3/uL (ref 0.0–0.5)
Eosinophils Relative: 4 %
HCT: 42.3 % (ref 39.0–52.0)
Hemoglobin: 14.5 g/dL (ref 13.0–17.0)
Immature Granulocytes: 0 %
Lymphocytes Relative: 26 %
Lymphs Abs: 1.2 10*3/uL (ref 0.7–4.0)
MCH: 31.9 pg (ref 26.0–34.0)
MCHC: 34.3 g/dL (ref 30.0–36.0)
MCV: 93.2 fL (ref 80.0–100.0)
Monocytes Absolute: 0.5 10*3/uL (ref 0.1–1.0)
Monocytes Relative: 11 %
Neutro Abs: 2.7 10*3/uL (ref 1.7–7.7)
Neutrophils Relative %: 57 %
Platelet Count: 199 10*3/uL (ref 150–400)
RBC: 4.54 MIL/uL (ref 4.22–5.81)
RDW: 12.5 % (ref 11.5–15.5)
WBC Count: 4.7 10*3/uL (ref 4.0–10.5)
nRBC: 0 % (ref 0.0–0.2)

## 2022-07-15 LAB — CMP (CANCER CENTER ONLY)
ALT: 15 U/L (ref 0–44)
AST: 18 U/L (ref 15–41)
Albumin: 4 g/dL (ref 3.5–5.0)
Alkaline Phosphatase: 71 U/L (ref 38–126)
Anion gap: 5 (ref 5–15)
BUN: 13 mg/dL (ref 8–23)
CO2: 28 mmol/L (ref 22–32)
Calcium: 9.5 mg/dL (ref 8.9–10.3)
Chloride: 110 mmol/L (ref 98–111)
Creatinine: 1.25 mg/dL — ABNORMAL HIGH (ref 0.61–1.24)
GFR, Estimated: >60 mL/min (ref >60)
Glucose, Bld: 143 mg/dL — ABNORMAL HIGH (ref 70–99)
Potassium: 3.7 mmol/L (ref 3.5–5.1)
Sodium: 143 mmol/L (ref 135–145)
Total Bilirubin: 0.6 mg/dL (ref 0.3–1.2)
Total Protein: 6.3 g/dL — ABNORMAL LOW (ref 6.5–8.1)

## 2022-07-15 NOTE — Progress Notes (Signed)
[facility] at [place], [place] [address] [phone]   Interval Evaluation  Date of Service: 07/15/22 Patient Name: Mathew Leach Patient MRN: [id number] Patient DOB: 1951/07/11 Provider: Ventura Sellers, MD  Identifying Statement:  Mathew Leach is a 71 y.o. male with left frontal glioblastoma   Referring Provider:  Oncologic History: Oncology History  Frontal glioblastoma multiforme ([facility])  09/18/2020 Surgery   Left frontal craniotomy, resection by Dr. Kathyrn Sheriff; path demonstrates Glioblastaoma IDH-wt   10/17/2020 - 11/06/2020 Radiation Therapy   Completes abbreviated 15 fraction IMRT w/ concurrent Temodar      Chemotherapy   Patient is on Treatment Plan :  BRAIN GLIOBLASTOMA Post XRT Temozolomide Days 1-5 q28 Days      12/31/2021 Progression   Progression of disease.  Plan to resume 5-day Temodar.   02/18/2022 Progression   Progression of disease confirmed on 1 month f/u MRI   03/13/2022 - 03/13/2022 Chemotherapy   Patient is on Treatment Plan : BRAIN [place] q42d     05/04/2022 -  Chemotherapy   Patient is on Treatment Plan : BRAIN GLIOBLASTOMA Consolidation Temozolomide Days 1-5 q28 Days      05/08/2022 -  Chemotherapy   Patient is on Treatment Plan : BRAIN GBM Bevacizumab 14d x 6 cycles       Biomarkers:  MGMT Unknown.  IDH 1/2 Wild type.  EGFR Unknown  TERT Unknown   Interval History: Mathew Leach presents today following recent MRI brain.  Wife describes further decline in functional status.  He is only feeding himself, all other ADLs requiring full support.  Short term memory and cognition is worse.  It was extremely difficult getting him here today for the visit.  Currently on Keppra 1523m BID and Vimpat 1059mBID without further seizures.    H+P (10/04/20)  Mathew Phlegmresented to medical attention three weeks ago with new onset generalized seizure.  This occurred at work,  unwitnessed, and then second event was witnessed by EMS described as generalized shaking.  CNS imaging demonstrated left frontal mass lesion, which was resected by Dr. NuKathyrn Sheriffn 09/18/20.  Following surgery, he had no clinical complaints.  Workup did also demonstrate mass within kidney possibly c/w renal cell carcinoma.  He presents today for path review; he is fully independent for age, lives with his wife.   Medications: Current Outpatient Medications on File Prior to Visit  Medication Sig Dispense Refill   amLODipine (NORVASC) 10 MG tablet Take 10 mg by mouth daily.     apixaban (ELIQUIS) 5 MG TABS tablet Take 1 tablet (5 mg total) by mouth 2 (two) times daily. START this prescription after you have completed the starter pack of apixaban. 60 tablet 3   Lacosamide 100 MG TABS Take 1 tablet (100 mg total) by mouth 2 (two) times daily. 60 tablet 3   levETIRAcetam (KEPPRA) 750 MG tablet Take 2 tablets (1,500 mg total) by mouth 2 (two) times daily. 90 tablet 0   loperamide (IMODIUM) 2 MG capsule Take 2 mg by mouth daily as needed for diarrhea or loose stools.     LORazepam (ATIVAN) 1 MG tablet Take 1 tablet (1 mg total) by mouth every 8 (eight) hours as needed for seizure. (Patient not taking: Reported on 06/05/2022) 10 tablet 0   ondansetron (ZOFRAN) 8 MG tablet Take 1 tablet (8 mg total) by mouth 2 (two) times daily as needed (nausea and vomiting). May take 30-60 minutes  prior to Temodar administration if nausea/vomiting occurs. 30 tablet 1   simvastatin (ZOCOR) 20 MG tablet Take 1 tablet (20 mg total) by mouth daily. (Patient taking differently: Take 20 mg by mouth at bedtime.) 90 tablet 3   No current facility-administered medications on file prior to visit.    Allergies:  Allergies  Allergen Reactions   Lipitor [Atorvastatin] Other (See Comments)    Caused DOUBLE VISION    Past Medical History:  Past Medical History:  Diagnosis Date   Actinic keratosis    scalp   Arthritis    ASD  (atrial septal defect) 06/10/2015   Atrial flutter, paroxysmal ([facility]) 08/01/2015   Cancer of right kidney ([facility]) 06/05/2021   Chronic anticoagulation 08/29/2015   Chronic pain of right knee 12/07/2014   Dysplastic nevus 01/11/2015   upper back spinal   Dysplastic nevus 01/29/2017   right superior calf inferior to popliteal   Dysrhythmia    AFIB   Goals of care, counseling/discussion 09/17/2020   Hypertension    Kidney stones    Obstructive sleep apnea syndrome 06/26/2015   PAF (paroxysmal atrial fibrillation) ([facility]) 07/30/2015   Pericarditis    1999   PFO (patent foramen ovale) 06/16/2018   Preoperative cardiovascular examination 02/03/2016   S/P TKR (total knee replacement) using cement, 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   Shortness of breath dyspnea    W/ EXERTION    Sleep apnea    CPAP   Stroke ([facility])    04-2015   Past Surgical History:  Past Surgical History:  Procedure Laterality Date   APPLICATION OF CRANIAL NAVIGATION Left 09/18/2020   Procedure: APPLICATION OF CRANIAL NAVIGATION;  Surgeon: Consuella Lose, MD;  Location: [facility];  Service: Neurosurgery;  Laterality: Left;   COLONOSCOPY  04/10/2011   Moderate predominantly sigmoid diverticulosis. Small internal hemorrhoids.    COLONOSCOPY  01/30/2020   CRANIOTOMY Left 09/18/2020   Procedure: LEFT FRONTAL CRANIOTOMY FOR  TUMOR;  Surgeon: Consuella Lose, MD;  Location: [facility];  Service: Neurosurgery;  Laterality: Left;  left   CYST REMOVAL NECK     AGE 33   ESOPHAGOGASTRODUODENOSCOPY  04/10/2011   Erosive esophagitis with esophageal stricture (asymptomatic strictures since the patient not having any dysphagia). LA grade D esophagitis.   INGUINAL HERNIA REPAIR     LEFT AS CHILD   ROBOTIC ASSITED PARTIAL NEPHRECTOMY Right 08/28/2021   Procedure: XI ROBOTIC ASSISTED PARTIAL NEPHRECTOMY AND RENAL CYST DECORTICATION WITH INTRAOPERTATIVE ULTRASOUND;  Surgeon: Alexis Frock, MD;  Location: [facility];  Service: Urology;  Laterality: Right;  3 HRS   TOTAL  KNEE ARTHROPLASTY Right 03/17/2016   Procedure: TOTAL KNEE ARTHROPLASTY;  Surgeon: [name], MD;  Location: [facility];  Service: Orthopedics;  Laterality: Right;   watchman procedure  07/2019   no longer needs Eliquis   Social History:  Social History   Socioeconomic History   Marital status: Married    Spouse name: Not on file   Number of children: Not on file   Years of education: Not on file   Highest education level: Not on file  Occupational History   Not on file  Tobacco Use   Smoking status: Never   Smokeless tobacco: Never  Vaping Use   Vaping Use: Never used  Substance and Sexual Activity   Alcohol use: No   Drug use: No   Sexual activity: Not on file  Other Topics Concern   Not on file  Social History Narrative   Not on file   Social Determinants of  Health   Financial Resource Strain: Not on file  Food Insecurity: Not on file  Transportation Needs: Not on file  Physical Activity: Not on file  Stress: Not on file  Social Connections: Not on file  Intimate Partner Violence: Not on file   Family History:  Family History  Problem Relation Age of Onset   Lung cancer Father    Colon cancer Neg Hx    Esophageal cancer Neg Hx    Colon polyps Neg Hx    Rectal cancer Neg Hx    Stomach cancer Neg Hx     Review of Systems: Constitutional: Doesn't report fevers, chills or abnormal weight loss Eyes: Doesn't report blurriness of vision Ears, nose, mouth, throat, and face: Doesn't report sore throat Respiratory: Doesn't report cough, dyspnea or wheezes Cardiovascular: Doesn't report palpitation, chest discomfort  Gastrointestinal:  Doesn't report nausea, constipation, diarrhea GU: Doesn't report incontinence Skin: Doesn't report skin rashes Neurological: Per HPI Musculoskeletal: Doesn't report joint pain Behavioral/Psych: Doesn't report anxiety  Physical Exam: Vitals:   07/15/22 1108  BP: 134/75  Pulse: 76  Resp: 17  Temp: 97.7 F (36.5 C)  SpO2: 95%    KPS: 60. General: Alert, cooperative, pleasant, in no acute distress Head: Normal EENT: No conjunctival injection or scleral icterus.  Lungs: Resp effort normal Cardiac: Regular rate Abdomen: Non-distended abdomen Skin: No rashes cyanosis or petechiae. Extremities: No clubbing or edema  Neurologic Exam: Mental Status: Awake, alert, attentive to examiner. Oriented to self and environment. Language is notable for impaired fluency, impaired comprehension to multistep commands Cranial Nerves: Visual acuity is grossly normal. Visual fields are full. Extra-ocular movements intact. No ptosis. Face is symmetric Motor: Tone and bulk are normal. Power is 4+/5 in right arm and leg. Reflexes are symmetric, no pathologic reflexes present.  Sensory: Intact to light touch Gait: non ambulatory   Labs: I have reviewed the data as listed    Component Value Date/Time   NA 143 07/15/2022 1018   NA 143 02/13/2020 1037   K 3.7 07/15/2022 1018   CL 110 07/15/2022 1018   CO2 28 07/15/2022 1018   GLUCOSE 143 (H) 07/15/2022 1018   BUN 13 07/15/2022 1018   BUN 13 02/13/2020 1037   CREATININE 1.25 (H) 07/15/2022 1018   CALCIUM 9.5 07/15/2022 1018   PROT 6.3 (L) 07/15/2022 1018   PROT 6.7 02/13/2020 1037   ALBUMIN 4.0 07/15/2022 1018   ALBUMIN 4.3 02/13/2020 1037   AST 18 07/15/2022 1018   ALT 15 07/15/2022 1018   ALKPHOS 71 07/15/2022 1018   BILITOT 0.6 07/15/2022 1018   GFRNONAA >60 07/15/2022 5597   GFRAA DUPLICATE 41/63/8453 6468   Lab Results  Component Value Date   WBC 4.7 07/15/2022   NEUTROABS 2.7 07/15/2022   HGB 14.5 07/15/2022   HCT 42.3 07/15/2022   MCV 93.2 07/15/2022   PLT 199 07/15/2022    Imaging:  [facility] Clinician Interpretation: I have personally reviewed the CNS images as listed.  My interpretation, in the context of the patient's clinical presentation, is stable disease  MR BRAIN W WO CONTRAST  Result Date: 07/07/2022 CLINICAL DATA:  Brain/CNS neoplasm, assess  treatment response; glioblastoma EXAM: MRI HEAD WITHOUT AND WITH CONTRAST TECHNIQUE: Multiplanar, multiecho pulse sequences of the brain and surrounding structures were obtained without and with intravenous contrast. CONTRAST:  76m GADAVIST GADOBUTROL 1 MMOL/ML IV SOLN COMPARISON:  04/25/2022 FINDINGS: Brain: Left frontal surgical cavity with chronic blood products is again identified communicating with the left lateral  ventricle. Previously seen irregular enhancement at the margins has nearly resolved. There is no new abnormal enhancement. Surrounding T2 FLAIR hyperintensity has also decreased. Stable extra-axial collection underlying the craniotomy. There is unchanged postoperative dural enhancement in this region. Chronic intraventricular blood products and chronic subarachnoid blood products along the brainstem. No acute infarction. Vascular: Major vessel flow voids at the skull base are preserved. Skull and upper cervical spine: Normal marrow signal is preserved. Sinuses/Orbits: Paranasal sinuses are aerated. Orbits are unremarkable. Other: Sella is unremarkable.  Mastoid air cells are clear. IMPRESSION: Nearly resolved enhancement likely reflecting bevacizumab effect. Surrounding T2 FLAIR hyperintensity has also decreased. Electronically Signed   By: Macy Mis M.D.   On: 07/07/2022 14:16    Assessment/Plan Frontal glioblastoma multiforme [facility]) [C71.1]  Mathew Leach presents today with further clinical progression and decline in functional status.  MRI demonstrate stable findings but this could represent avastin artifact.  We extensively discussed goals of care today given his decline.  Candidacy for hospice was reviewed, patient is now interested in enrolling given decline in quality of life.   We will defer further avastin treatments.  Keppra will continue at 1575m BID, Vimpat 1034mBID.    Mathew Leach transition to hospice as discussed, and return to clinic only as  needed.  All questions were answered. The patient knows to call the clinic with any problems, questions or concerns. No barriers to learning were detected.  I have spent a total of 30 minutes of face-to-face and non-face-to-face time, excluding clinical staff time, preparing to see patient, ordering tests and/or medications, counseling the patient, and independently interpreting results and communicating results to the patient/family/caregiver    ZaVentura SellersMD Medical Director of Neuro-Oncology [facility] WeCyril8/29/23 11:16 AM

## 2022-07-15 NOTE — Progress Notes (Signed)
Called referral to [facility].

## 2022-07-18 DIAGNOSIS — I480 Paroxysmal atrial fibrillation: Secondary | ICD-10-CM | POA: Diagnosis not present

## 2022-07-18 DIAGNOSIS — N200 Calculus of kidney: Secondary | ICD-10-CM | POA: Diagnosis not present

## 2022-07-18 DIAGNOSIS — G40909 Epilepsy, unspecified, not intractable, without status epilepticus: Secondary | ICD-10-CM | POA: Diagnosis not present

## 2022-07-18 DIAGNOSIS — C711 Malignant neoplasm of frontal lobe: Secondary | ICD-10-CM | POA: Diagnosis not present

## 2022-07-18 DIAGNOSIS — Z85528 Personal history of other malignant neoplasm of kidney: Secondary | ICD-10-CM | POA: Diagnosis not present

## 2022-07-18 DIAGNOSIS — Z8673 Personal history of transient ischemic attack (TIA), and cerebral infarction without residual deficits: Secondary | ICD-10-CM | POA: Diagnosis not present

## 2022-07-18 DIAGNOSIS — Z8679 Personal history of other diseases of the circulatory system: Secondary | ICD-10-CM | POA: Diagnosis not present

## 2022-07-18 DIAGNOSIS — G4733 Obstructive sleep apnea (adult) (pediatric): Secondary | ICD-10-CM | POA: Diagnosis not present

## 2022-07-18 DIAGNOSIS — I4892 Unspecified atrial flutter: Secondary | ICD-10-CM | POA: Diagnosis not present

## 2022-07-18 DIAGNOSIS — M1990 Unspecified osteoarthritis, unspecified site: Secondary | ICD-10-CM | POA: Diagnosis not present

## 2022-07-18 DIAGNOSIS — I10 Essential (primary) hypertension: Secondary | ICD-10-CM | POA: Diagnosis not present

## 2022-07-18 DIAGNOSIS — Z9989 Dependence on other enabling machines and devices: Secondary | ICD-10-CM | POA: Diagnosis not present

## 2022-07-18 DIAGNOSIS — I2699 Other pulmonary embolism without acute cor pulmonale: Secondary | ICD-10-CM | POA: Diagnosis not present

## 2022-07-22 DIAGNOSIS — Z85528 Personal history of other malignant neoplasm of kidney: Secondary | ICD-10-CM | POA: Diagnosis not present

## 2022-07-22 DIAGNOSIS — G40909 Epilepsy, unspecified, not intractable, without status epilepticus: Secondary | ICD-10-CM | POA: Diagnosis not present

## 2022-07-22 DIAGNOSIS — I2699 Other pulmonary embolism without acute cor pulmonale: Secondary | ICD-10-CM | POA: Diagnosis not present

## 2022-07-22 DIAGNOSIS — C711 Malignant neoplasm of frontal lobe: Secondary | ICD-10-CM | POA: Diagnosis not present

## 2022-07-22 DIAGNOSIS — Z8679 Personal history of other diseases of the circulatory system: Secondary | ICD-10-CM | POA: Diagnosis not present

## 2022-07-22 DIAGNOSIS — I10 Essential (primary) hypertension: Secondary | ICD-10-CM | POA: Diagnosis not present

## 2022-07-25 DIAGNOSIS — I10 Essential (primary) hypertension: Secondary | ICD-10-CM | POA: Diagnosis not present

## 2022-07-25 DIAGNOSIS — Z85528 Personal history of other malignant neoplasm of kidney: Secondary | ICD-10-CM | POA: Diagnosis not present

## 2022-07-25 DIAGNOSIS — G40909 Epilepsy, unspecified, not intractable, without status epilepticus: Secondary | ICD-10-CM | POA: Diagnosis not present

## 2022-07-25 DIAGNOSIS — Z8679 Personal history of other diseases of the circulatory system: Secondary | ICD-10-CM | POA: Diagnosis not present

## 2022-07-25 DIAGNOSIS — C711 Malignant neoplasm of frontal lobe: Secondary | ICD-10-CM | POA: Diagnosis not present

## 2022-07-25 DIAGNOSIS — I2699 Other pulmonary embolism without acute cor pulmonale: Secondary | ICD-10-CM | POA: Diagnosis not present

## 2022-07-29 DIAGNOSIS — Z85528 Personal history of other malignant neoplasm of kidney: Secondary | ICD-10-CM | POA: Diagnosis not present

## 2022-07-29 DIAGNOSIS — Z8679 Personal history of other diseases of the circulatory system: Secondary | ICD-10-CM | POA: Diagnosis not present

## 2022-07-29 DIAGNOSIS — C711 Malignant neoplasm of frontal lobe: Secondary | ICD-10-CM | POA: Diagnosis not present

## 2022-07-29 DIAGNOSIS — I10 Essential (primary) hypertension: Secondary | ICD-10-CM | POA: Diagnosis not present

## 2022-07-29 DIAGNOSIS — I2699 Other pulmonary embolism without acute cor pulmonale: Secondary | ICD-10-CM | POA: Diagnosis not present

## 2022-07-29 DIAGNOSIS — G40909 Epilepsy, unspecified, not intractable, without status epilepticus: Secondary | ICD-10-CM | POA: Diagnosis not present

## 2022-08-04 ENCOUNTER — Other Ambulatory Visit: Payer: Self-pay | Admitting: Internal Medicine

## 2022-08-04 ENCOUNTER — Telehealth: Payer: Self-pay | Admitting: *Deleted

## 2022-08-04 DIAGNOSIS — I2699 Other pulmonary embolism without acute cor pulmonale: Secondary | ICD-10-CM | POA: Diagnosis not present

## 2022-08-04 DIAGNOSIS — Z8679 Personal history of other diseases of the circulatory system: Secondary | ICD-10-CM | POA: Diagnosis not present

## 2022-08-04 DIAGNOSIS — Z85528 Personal history of other malignant neoplasm of kidney: Secondary | ICD-10-CM | POA: Diagnosis not present

## 2022-08-04 DIAGNOSIS — I10 Essential (primary) hypertension: Secondary | ICD-10-CM | POA: Diagnosis not present

## 2022-08-04 DIAGNOSIS — G40909 Epilepsy, unspecified, not intractable, without status epilepticus: Secondary | ICD-10-CM | POA: Diagnosis not present

## 2022-08-04 DIAGNOSIS — C711 Malignant neoplasm of frontal lobe: Secondary | ICD-10-CM | POA: Diagnosis not present

## 2022-08-04 MED ORDER — LACOSAMIDE 100 MG PO TABS
ORAL_TABLET | ORAL | 3 refills | Status: AC
Start: 2022-08-04 — End: ?

## 2022-08-04 NOTE — Telephone Encounter (Signed)
Spoke with wife and she stated understanding.

## 2022-08-04 NOTE — Telephone Encounter (Signed)
Wife called to report patient had a seizure the other day.  From description sounds like tonic clonic that lasted 5-6 minutes.  It resolved on its own.  She immediately gave Ativan 1 mg.  No further seizures occurred.  She states that he didn't seem relaxed enough post the seizure that she called Hospice and they advised she could dose the second Ativan 1 mg which seemed to have helped some.   She is questioning if his current anticonvulsant medication should be adjusted.  This is the first seizure since April.  Routed to Dr Mickeal Skinner for advice.

## 2022-08-04 NOTE — Telephone Encounter (Signed)
If medication is adjusted patients wife asks that Rx be routed locally to [facility].

## 2022-08-05 DIAGNOSIS — G40909 Epilepsy, unspecified, not intractable, without status epilepticus: Secondary | ICD-10-CM | POA: Diagnosis not present

## 2022-08-05 DIAGNOSIS — Z85528 Personal history of other malignant neoplasm of kidney: Secondary | ICD-10-CM | POA: Diagnosis not present

## 2022-08-05 DIAGNOSIS — Z8679 Personal history of other diseases of the circulatory system: Secondary | ICD-10-CM | POA: Diagnosis not present

## 2022-08-05 DIAGNOSIS — I10 Essential (primary) hypertension: Secondary | ICD-10-CM | POA: Diagnosis not present

## 2022-08-05 DIAGNOSIS — C711 Malignant neoplasm of frontal lobe: Secondary | ICD-10-CM | POA: Diagnosis not present

## 2022-08-05 DIAGNOSIS — I2699 Other pulmonary embolism without acute cor pulmonale: Secondary | ICD-10-CM | POA: Diagnosis not present

## 2022-08-08 DIAGNOSIS — I10 Essential (primary) hypertension: Secondary | ICD-10-CM | POA: Diagnosis not present

## 2022-08-08 DIAGNOSIS — Z85528 Personal history of other malignant neoplasm of kidney: Secondary | ICD-10-CM | POA: Diagnosis not present

## 2022-08-08 DIAGNOSIS — Z8679 Personal history of other diseases of the circulatory system: Secondary | ICD-10-CM | POA: Diagnosis not present

## 2022-08-08 DIAGNOSIS — C711 Malignant neoplasm of frontal lobe: Secondary | ICD-10-CM | POA: Diagnosis not present

## 2022-08-08 DIAGNOSIS — G40909 Epilepsy, unspecified, not intractable, without status epilepticus: Secondary | ICD-10-CM | POA: Diagnosis not present

## 2022-08-08 DIAGNOSIS — I2699 Other pulmonary embolism without acute cor pulmonale: Secondary | ICD-10-CM | POA: Diagnosis not present

## 2022-08-09 DIAGNOSIS — C711 Malignant neoplasm of frontal lobe: Secondary | ICD-10-CM | POA: Diagnosis not present

## 2022-08-09 DIAGNOSIS — I10 Essential (primary) hypertension: Secondary | ICD-10-CM | POA: Diagnosis not present

## 2022-08-09 DIAGNOSIS — G40909 Epilepsy, unspecified, not intractable, without status epilepticus: Secondary | ICD-10-CM | POA: Diagnosis not present

## 2022-08-09 DIAGNOSIS — Z8679 Personal history of other diseases of the circulatory system: Secondary | ICD-10-CM | POA: Diagnosis not present

## 2022-08-09 DIAGNOSIS — Z85528 Personal history of other malignant neoplasm of kidney: Secondary | ICD-10-CM | POA: Diagnosis not present

## 2022-08-09 DIAGNOSIS — I2699 Other pulmonary embolism without acute cor pulmonale: Secondary | ICD-10-CM | POA: Diagnosis not present

## 2022-08-10 DIAGNOSIS — I10 Essential (primary) hypertension: Secondary | ICD-10-CM | POA: Diagnosis not present

## 2022-08-10 DIAGNOSIS — Z85528 Personal history of other malignant neoplasm of kidney: Secondary | ICD-10-CM | POA: Diagnosis not present

## 2022-08-10 DIAGNOSIS — G40909 Epilepsy, unspecified, not intractable, without status epilepticus: Secondary | ICD-10-CM | POA: Diagnosis not present

## 2022-08-10 DIAGNOSIS — I2699 Other pulmonary embolism without acute cor pulmonale: Secondary | ICD-10-CM | POA: Diagnosis not present

## 2022-08-10 DIAGNOSIS — C711 Malignant neoplasm of frontal lobe: Secondary | ICD-10-CM | POA: Diagnosis not present

## 2022-08-10 DIAGNOSIS — Z8679 Personal history of other diseases of the circulatory system: Secondary | ICD-10-CM | POA: Diagnosis not present

## 2022-08-11 DIAGNOSIS — Z85528 Personal history of other malignant neoplasm of kidney: Secondary | ICD-10-CM | POA: Diagnosis not present

## 2022-08-11 DIAGNOSIS — I10 Essential (primary) hypertension: Secondary | ICD-10-CM | POA: Diagnosis not present

## 2022-08-11 DIAGNOSIS — G40909 Epilepsy, unspecified, not intractable, without status epilepticus: Secondary | ICD-10-CM | POA: Diagnosis not present

## 2022-08-11 DIAGNOSIS — Z8679 Personal history of other diseases of the circulatory system: Secondary | ICD-10-CM | POA: Diagnosis not present

## 2022-08-11 DIAGNOSIS — I2699 Other pulmonary embolism without acute cor pulmonale: Secondary | ICD-10-CM | POA: Diagnosis not present

## 2022-08-11 DIAGNOSIS — C711 Malignant neoplasm of frontal lobe: Secondary | ICD-10-CM | POA: Diagnosis not present

## 2022-08-12 DIAGNOSIS — G40909 Epilepsy, unspecified, not intractable, without status epilepticus: Secondary | ICD-10-CM | POA: Diagnosis not present

## 2022-08-12 DIAGNOSIS — C711 Malignant neoplasm of frontal lobe: Secondary | ICD-10-CM | POA: Diagnosis not present

## 2022-08-12 DIAGNOSIS — Z85528 Personal history of other malignant neoplasm of kidney: Secondary | ICD-10-CM | POA: Diagnosis not present

## 2022-08-12 DIAGNOSIS — I10 Essential (primary) hypertension: Secondary | ICD-10-CM | POA: Diagnosis not present

## 2022-08-12 DIAGNOSIS — Z8679 Personal history of other diseases of the circulatory system: Secondary | ICD-10-CM | POA: Diagnosis not present

## 2022-08-12 DIAGNOSIS — I2699 Other pulmonary embolism without acute cor pulmonale: Secondary | ICD-10-CM | POA: Diagnosis not present

## 2022-08-13 DIAGNOSIS — I10 Essential (primary) hypertension: Secondary | ICD-10-CM | POA: Diagnosis not present

## 2022-08-13 DIAGNOSIS — I2699 Other pulmonary embolism without acute cor pulmonale: Secondary | ICD-10-CM | POA: Diagnosis not present

## 2022-08-13 DIAGNOSIS — Z85528 Personal history of other malignant neoplasm of kidney: Secondary | ICD-10-CM | POA: Diagnosis not present

## 2022-08-13 DIAGNOSIS — C711 Malignant neoplasm of frontal lobe: Secondary | ICD-10-CM | POA: Diagnosis not present

## 2022-08-13 DIAGNOSIS — G40909 Epilepsy, unspecified, not intractable, without status epilepticus: Secondary | ICD-10-CM | POA: Diagnosis not present

## 2022-08-13 DIAGNOSIS — Z8679 Personal history of other diseases of the circulatory system: Secondary | ICD-10-CM | POA: Diagnosis not present

## 2022-08-14 DIAGNOSIS — Z85528 Personal history of other malignant neoplasm of kidney: Secondary | ICD-10-CM | POA: Diagnosis not present

## 2022-08-14 DIAGNOSIS — C711 Malignant neoplasm of frontal lobe: Secondary | ICD-10-CM | POA: Diagnosis not present

## 2022-08-14 DIAGNOSIS — I2699 Other pulmonary embolism without acute cor pulmonale: Secondary | ICD-10-CM | POA: Diagnosis not present

## 2022-08-14 DIAGNOSIS — G40909 Epilepsy, unspecified, not intractable, without status epilepticus: Secondary | ICD-10-CM | POA: Diagnosis not present

## 2022-08-14 DIAGNOSIS — I10 Essential (primary) hypertension: Secondary | ICD-10-CM | POA: Diagnosis not present

## 2022-08-14 DIAGNOSIS — Z8679 Personal history of other diseases of the circulatory system: Secondary | ICD-10-CM | POA: Diagnosis not present

## 2022-08-17 DIAGNOSIS — N200 Calculus of kidney: Secondary | ICD-10-CM | POA: Diagnosis not present

## 2022-08-17 DIAGNOSIS — I2699 Other pulmonary embolism without acute cor pulmonale: Secondary | ICD-10-CM | POA: Diagnosis not present

## 2022-08-17 DIAGNOSIS — Z8679 Personal history of other diseases of the circulatory system: Secondary | ICD-10-CM | POA: Diagnosis not present

## 2022-08-17 DIAGNOSIS — I4892 Unspecified atrial flutter: Secondary | ICD-10-CM | POA: Diagnosis not present

## 2022-08-17 DIAGNOSIS — I10 Essential (primary) hypertension: Secondary | ICD-10-CM | POA: Diagnosis not present

## 2022-08-17 DIAGNOSIS — G4733 Obstructive sleep apnea (adult) (pediatric): Secondary | ICD-10-CM | POA: Diagnosis not present

## 2022-08-17 DIAGNOSIS — Z85528 Personal history of other malignant neoplasm of kidney: Secondary | ICD-10-CM | POA: Diagnosis not present

## 2022-08-17 DIAGNOSIS — C711 Malignant neoplasm of frontal lobe: Secondary | ICD-10-CM | POA: Diagnosis not present

## 2022-08-17 DIAGNOSIS — M1990 Unspecified osteoarthritis, unspecified site: Secondary | ICD-10-CM | POA: Diagnosis not present

## 2022-08-17 DIAGNOSIS — I480 Paroxysmal atrial fibrillation: Secondary | ICD-10-CM | POA: Diagnosis not present

## 2022-08-17 DIAGNOSIS — G40909 Epilepsy, unspecified, not intractable, without status epilepticus: Secondary | ICD-10-CM | POA: Diagnosis not present

## 2022-08-17 DIAGNOSIS — Z9989 Dependence on other enabling machines and devices: Secondary | ICD-10-CM | POA: Diagnosis not present

## 2022-08-17 DIAGNOSIS — Z8673 Personal history of transient ischemic attack (TIA), and cerebral infarction without residual deficits: Secondary | ICD-10-CM | POA: Diagnosis not present

## 2022-08-19 DIAGNOSIS — G40909 Epilepsy, unspecified, not intractable, without status epilepticus: Secondary | ICD-10-CM | POA: Diagnosis not present

## 2022-08-19 DIAGNOSIS — I10 Essential (primary) hypertension: Secondary | ICD-10-CM | POA: Diagnosis not present

## 2022-08-19 DIAGNOSIS — C711 Malignant neoplasm of frontal lobe: Secondary | ICD-10-CM | POA: Diagnosis not present

## 2022-08-19 DIAGNOSIS — I2699 Other pulmonary embolism without acute cor pulmonale: Secondary | ICD-10-CM | POA: Diagnosis not present

## 2022-08-19 DIAGNOSIS — Z8679 Personal history of other diseases of the circulatory system: Secondary | ICD-10-CM | POA: Diagnosis not present

## 2022-08-19 DIAGNOSIS — Z85528 Personal history of other malignant neoplasm of kidney: Secondary | ICD-10-CM | POA: Diagnosis not present

## 2022-08-20 DIAGNOSIS — C711 Malignant neoplasm of frontal lobe: Secondary | ICD-10-CM | POA: Diagnosis not present

## 2022-08-20 DIAGNOSIS — Z8679 Personal history of other diseases of the circulatory system: Secondary | ICD-10-CM | POA: Diagnosis not present

## 2022-08-20 DIAGNOSIS — I2699 Other pulmonary embolism without acute cor pulmonale: Secondary | ICD-10-CM | POA: Diagnosis not present

## 2022-08-20 DIAGNOSIS — Z85528 Personal history of other malignant neoplasm of kidney: Secondary | ICD-10-CM | POA: Diagnosis not present

## 2022-08-20 DIAGNOSIS — I10 Essential (primary) hypertension: Secondary | ICD-10-CM | POA: Diagnosis not present

## 2022-08-20 DIAGNOSIS — G40909 Epilepsy, unspecified, not intractable, without status epilepticus: Secondary | ICD-10-CM | POA: Diagnosis not present

## 2022-08-21 DIAGNOSIS — C711 Malignant neoplasm of frontal lobe: Secondary | ICD-10-CM | POA: Diagnosis not present

## 2022-08-21 DIAGNOSIS — Z85528 Personal history of other malignant neoplasm of kidney: Secondary | ICD-10-CM | POA: Diagnosis not present

## 2022-08-21 DIAGNOSIS — I10 Essential (primary) hypertension: Secondary | ICD-10-CM | POA: Diagnosis not present

## 2022-08-21 DIAGNOSIS — G40909 Epilepsy, unspecified, not intractable, without status epilepticus: Secondary | ICD-10-CM | POA: Diagnosis not present

## 2022-08-21 DIAGNOSIS — I2699 Other pulmonary embolism without acute cor pulmonale: Secondary | ICD-10-CM | POA: Diagnosis not present

## 2022-08-21 DIAGNOSIS — Z8679 Personal history of other diseases of the circulatory system: Secondary | ICD-10-CM | POA: Diagnosis not present

## 2022-08-22 DIAGNOSIS — I2699 Other pulmonary embolism without acute cor pulmonale: Secondary | ICD-10-CM | POA: Diagnosis not present

## 2022-08-22 DIAGNOSIS — G40909 Epilepsy, unspecified, not intractable, without status epilepticus: Secondary | ICD-10-CM | POA: Diagnosis not present

## 2022-08-22 DIAGNOSIS — Z85528 Personal history of other malignant neoplasm of kidney: Secondary | ICD-10-CM | POA: Diagnosis not present

## 2022-08-22 DIAGNOSIS — Z8679 Personal history of other diseases of the circulatory system: Secondary | ICD-10-CM | POA: Diagnosis not present

## 2022-08-22 DIAGNOSIS — I10 Essential (primary) hypertension: Secondary | ICD-10-CM | POA: Diagnosis not present

## 2022-08-22 DIAGNOSIS — C711 Malignant neoplasm of frontal lobe: Secondary | ICD-10-CM | POA: Diagnosis not present

## 2022-08-26 DIAGNOSIS — I10 Essential (primary) hypertension: Secondary | ICD-10-CM | POA: Diagnosis not present

## 2022-08-26 DIAGNOSIS — I2699 Other pulmonary embolism without acute cor pulmonale: Secondary | ICD-10-CM | POA: Diagnosis not present

## 2022-08-26 DIAGNOSIS — Z85528 Personal history of other malignant neoplasm of kidney: Secondary | ICD-10-CM | POA: Diagnosis not present

## 2022-08-26 DIAGNOSIS — C711 Malignant neoplasm of frontal lobe: Secondary | ICD-10-CM | POA: Diagnosis not present

## 2022-08-26 DIAGNOSIS — G40909 Epilepsy, unspecified, not intractable, without status epilepticus: Secondary | ICD-10-CM | POA: Diagnosis not present

## 2022-08-26 DIAGNOSIS — Z8679 Personal history of other diseases of the circulatory system: Secondary | ICD-10-CM | POA: Diagnosis not present

## 2022-08-28 DIAGNOSIS — C711 Malignant neoplasm of frontal lobe: Secondary | ICD-10-CM | POA: Diagnosis not present

## 2022-08-28 DIAGNOSIS — G40909 Epilepsy, unspecified, not intractable, without status epilepticus: Secondary | ICD-10-CM | POA: Diagnosis not present

## 2022-08-28 DIAGNOSIS — I2699 Other pulmonary embolism without acute cor pulmonale: Secondary | ICD-10-CM | POA: Diagnosis not present

## 2022-08-28 DIAGNOSIS — I10 Essential (primary) hypertension: Secondary | ICD-10-CM | POA: Diagnosis not present

## 2022-08-28 DIAGNOSIS — Z8679 Personal history of other diseases of the circulatory system: Secondary | ICD-10-CM | POA: Diagnosis not present

## 2022-08-28 DIAGNOSIS — Z85528 Personal history of other malignant neoplasm of kidney: Secondary | ICD-10-CM | POA: Diagnosis not present

## 2022-09-02 DIAGNOSIS — I2699 Other pulmonary embolism without acute cor pulmonale: Secondary | ICD-10-CM | POA: Diagnosis not present

## 2022-09-02 DIAGNOSIS — G40909 Epilepsy, unspecified, not intractable, without status epilepticus: Secondary | ICD-10-CM | POA: Diagnosis not present

## 2022-09-02 DIAGNOSIS — C711 Malignant neoplasm of frontal lobe: Secondary | ICD-10-CM | POA: Diagnosis not present

## 2022-09-02 DIAGNOSIS — I10 Essential (primary) hypertension: Secondary | ICD-10-CM | POA: Diagnosis not present

## 2022-09-02 DIAGNOSIS — Z85528 Personal history of other malignant neoplasm of kidney: Secondary | ICD-10-CM | POA: Diagnosis not present

## 2022-09-02 DIAGNOSIS — Z8679 Personal history of other diseases of the circulatory system: Secondary | ICD-10-CM | POA: Diagnosis not present

## 2022-09-04 DIAGNOSIS — C711 Malignant neoplasm of frontal lobe: Secondary | ICD-10-CM | POA: Diagnosis not present

## 2022-09-04 DIAGNOSIS — Z85528 Personal history of other malignant neoplasm of kidney: Secondary | ICD-10-CM | POA: Diagnosis not present

## 2022-09-04 DIAGNOSIS — I2699 Other pulmonary embolism without acute cor pulmonale: Secondary | ICD-10-CM | POA: Diagnosis not present

## 2022-09-04 DIAGNOSIS — G40909 Epilepsy, unspecified, not intractable, without status epilepticus: Secondary | ICD-10-CM | POA: Diagnosis not present

## 2022-09-04 DIAGNOSIS — Z8679 Personal history of other diseases of the circulatory system: Secondary | ICD-10-CM | POA: Diagnosis not present

## 2022-09-04 DIAGNOSIS — I10 Essential (primary) hypertension: Secondary | ICD-10-CM | POA: Diagnosis not present

## 2022-09-09 DIAGNOSIS — G40909 Epilepsy, unspecified, not intractable, without status epilepticus: Secondary | ICD-10-CM | POA: Diagnosis not present

## 2022-09-09 DIAGNOSIS — Z85528 Personal history of other malignant neoplasm of kidney: Secondary | ICD-10-CM | POA: Diagnosis not present

## 2022-09-09 DIAGNOSIS — I10 Essential (primary) hypertension: Secondary | ICD-10-CM | POA: Diagnosis not present

## 2022-09-09 DIAGNOSIS — C711 Malignant neoplasm of frontal lobe: Secondary | ICD-10-CM | POA: Diagnosis not present

## 2022-09-09 DIAGNOSIS — Z8679 Personal history of other diseases of the circulatory system: Secondary | ICD-10-CM | POA: Diagnosis not present

## 2022-09-09 DIAGNOSIS — I2699 Other pulmonary embolism without acute cor pulmonale: Secondary | ICD-10-CM | POA: Diagnosis not present

## 2022-09-11 DIAGNOSIS — I2699 Other pulmonary embolism without acute cor pulmonale: Secondary | ICD-10-CM | POA: Diagnosis not present

## 2022-09-11 DIAGNOSIS — Z8679 Personal history of other diseases of the circulatory system: Secondary | ICD-10-CM | POA: Diagnosis not present

## 2022-09-11 DIAGNOSIS — C711 Malignant neoplasm of frontal lobe: Secondary | ICD-10-CM | POA: Diagnosis not present

## 2022-09-11 DIAGNOSIS — Z85528 Personal history of other malignant neoplasm of kidney: Secondary | ICD-10-CM | POA: Diagnosis not present

## 2022-09-11 DIAGNOSIS — I10 Essential (primary) hypertension: Secondary | ICD-10-CM | POA: Diagnosis not present

## 2022-09-11 DIAGNOSIS — G40909 Epilepsy, unspecified, not intractable, without status epilepticus: Secondary | ICD-10-CM | POA: Diagnosis not present

## 2022-09-12 DIAGNOSIS — C711 Malignant neoplasm of frontal lobe: Secondary | ICD-10-CM | POA: Diagnosis not present

## 2022-09-12 DIAGNOSIS — G40909 Epilepsy, unspecified, not intractable, without status epilepticus: Secondary | ICD-10-CM | POA: Diagnosis not present

## 2022-09-12 DIAGNOSIS — I10 Essential (primary) hypertension: Secondary | ICD-10-CM | POA: Diagnosis not present

## 2022-09-12 DIAGNOSIS — I2699 Other pulmonary embolism without acute cor pulmonale: Secondary | ICD-10-CM | POA: Diagnosis not present

## 2022-09-12 DIAGNOSIS — Z85528 Personal history of other malignant neoplasm of kidney: Secondary | ICD-10-CM | POA: Diagnosis not present

## 2022-09-12 DIAGNOSIS — Z8679 Personal history of other diseases of the circulatory system: Secondary | ICD-10-CM | POA: Diagnosis not present

## 2022-09-16 DIAGNOSIS — G40909 Epilepsy, unspecified, not intractable, without status epilepticus: Secondary | ICD-10-CM | POA: Diagnosis not present

## 2022-09-16 DIAGNOSIS — Z8679 Personal history of other diseases of the circulatory system: Secondary | ICD-10-CM | POA: Diagnosis not present

## 2022-09-16 DIAGNOSIS — I2699 Other pulmonary embolism without acute cor pulmonale: Secondary | ICD-10-CM | POA: Diagnosis not present

## 2022-09-16 DIAGNOSIS — C711 Malignant neoplasm of frontal lobe: Secondary | ICD-10-CM | POA: Diagnosis not present

## 2022-09-16 DIAGNOSIS — I10 Essential (primary) hypertension: Secondary | ICD-10-CM | POA: Diagnosis not present

## 2022-09-16 DIAGNOSIS — Z85528 Personal history of other malignant neoplasm of kidney: Secondary | ICD-10-CM | POA: Diagnosis not present

## 2022-09-17 DIAGNOSIS — I10 Essential (primary) hypertension: Secondary | ICD-10-CM | POA: Diagnosis not present

## 2022-09-17 DIAGNOSIS — M1990 Unspecified osteoarthritis, unspecified site: Secondary | ICD-10-CM | POA: Diagnosis not present

## 2022-09-17 DIAGNOSIS — Z8679 Personal history of other diseases of the circulatory system: Secondary | ICD-10-CM | POA: Diagnosis not present

## 2022-09-17 DIAGNOSIS — I2699 Other pulmonary embolism without acute cor pulmonale: Secondary | ICD-10-CM | POA: Diagnosis not present

## 2022-09-17 DIAGNOSIS — C711 Malignant neoplasm of frontal lobe: Secondary | ICD-10-CM | POA: Diagnosis not present

## 2022-09-17 DIAGNOSIS — I480 Paroxysmal atrial fibrillation: Secondary | ICD-10-CM | POA: Diagnosis not present

## 2022-09-17 DIAGNOSIS — G4733 Obstructive sleep apnea (adult) (pediatric): Secondary | ICD-10-CM | POA: Diagnosis not present

## 2022-09-17 DIAGNOSIS — N200 Calculus of kidney: Secondary | ICD-10-CM | POA: Diagnosis not present

## 2022-09-17 DIAGNOSIS — Z9989 Dependence on other enabling machines and devices: Secondary | ICD-10-CM | POA: Diagnosis not present

## 2022-09-17 DIAGNOSIS — G40909 Epilepsy, unspecified, not intractable, without status epilepticus: Secondary | ICD-10-CM | POA: Diagnosis not present

## 2022-09-17 DIAGNOSIS — Z85528 Personal history of other malignant neoplasm of kidney: Secondary | ICD-10-CM | POA: Diagnosis not present

## 2022-09-17 DIAGNOSIS — I4892 Unspecified atrial flutter: Secondary | ICD-10-CM | POA: Diagnosis not present

## 2022-09-17 DIAGNOSIS — Z8673 Personal history of transient ischemic attack (TIA), and cerebral infarction without residual deficits: Secondary | ICD-10-CM | POA: Diagnosis not present

## 2022-09-18 DIAGNOSIS — Z85528 Personal history of other malignant neoplasm of kidney: Secondary | ICD-10-CM | POA: Diagnosis not present

## 2022-09-18 DIAGNOSIS — C711 Malignant neoplasm of frontal lobe: Secondary | ICD-10-CM | POA: Diagnosis not present

## 2022-09-18 DIAGNOSIS — I2699 Other pulmonary embolism without acute cor pulmonale: Secondary | ICD-10-CM | POA: Diagnosis not present

## 2022-09-18 DIAGNOSIS — Z8679 Personal history of other diseases of the circulatory system: Secondary | ICD-10-CM | POA: Diagnosis not present

## 2022-09-18 DIAGNOSIS — G40909 Epilepsy, unspecified, not intractable, without status epilepticus: Secondary | ICD-10-CM | POA: Diagnosis not present

## 2022-09-18 DIAGNOSIS — I10 Essential (primary) hypertension: Secondary | ICD-10-CM | POA: Diagnosis not present

## 2022-09-23 DIAGNOSIS — G40909 Epilepsy, unspecified, not intractable, without status epilepticus: Secondary | ICD-10-CM | POA: Diagnosis not present

## 2022-09-23 DIAGNOSIS — I2699 Other pulmonary embolism without acute cor pulmonale: Secondary | ICD-10-CM | POA: Diagnosis not present

## 2022-09-23 DIAGNOSIS — Z8679 Personal history of other diseases of the circulatory system: Secondary | ICD-10-CM | POA: Diagnosis not present

## 2022-09-23 DIAGNOSIS — C711 Malignant neoplasm of frontal lobe: Secondary | ICD-10-CM | POA: Diagnosis not present

## 2022-09-23 DIAGNOSIS — I10 Essential (primary) hypertension: Secondary | ICD-10-CM | POA: Diagnosis not present

## 2022-09-23 DIAGNOSIS — Z85528 Personal history of other malignant neoplasm of kidney: Secondary | ICD-10-CM | POA: Diagnosis not present

## 2022-09-25 DIAGNOSIS — I2699 Other pulmonary embolism without acute cor pulmonale: Secondary | ICD-10-CM | POA: Diagnosis not present

## 2022-09-25 DIAGNOSIS — G40909 Epilepsy, unspecified, not intractable, without status epilepticus: Secondary | ICD-10-CM | POA: Diagnosis not present

## 2022-09-25 DIAGNOSIS — Z85528 Personal history of other malignant neoplasm of kidney: Secondary | ICD-10-CM | POA: Diagnosis not present

## 2022-09-25 DIAGNOSIS — C711 Malignant neoplasm of frontal lobe: Secondary | ICD-10-CM | POA: Diagnosis not present

## 2022-09-25 DIAGNOSIS — I10 Essential (primary) hypertension: Secondary | ICD-10-CM | POA: Diagnosis not present

## 2022-09-25 DIAGNOSIS — Z8679 Personal history of other diseases of the circulatory system: Secondary | ICD-10-CM | POA: Diagnosis not present

## 2022-09-26 DIAGNOSIS — Z85528 Personal history of other malignant neoplasm of kidney: Secondary | ICD-10-CM | POA: Diagnosis not present

## 2022-09-26 DIAGNOSIS — G40909 Epilepsy, unspecified, not intractable, without status epilepticus: Secondary | ICD-10-CM | POA: Diagnosis not present

## 2022-09-26 DIAGNOSIS — Z8679 Personal history of other diseases of the circulatory system: Secondary | ICD-10-CM | POA: Diagnosis not present

## 2022-09-26 DIAGNOSIS — C711 Malignant neoplasm of frontal lobe: Secondary | ICD-10-CM | POA: Diagnosis not present

## 2022-09-26 DIAGNOSIS — I10 Essential (primary) hypertension: Secondary | ICD-10-CM | POA: Diagnosis not present

## 2022-09-26 DIAGNOSIS — I2699 Other pulmonary embolism without acute cor pulmonale: Secondary | ICD-10-CM | POA: Diagnosis not present

## 2022-09-27 DIAGNOSIS — Z85528 Personal history of other malignant neoplasm of kidney: Secondary | ICD-10-CM | POA: Diagnosis not present

## 2022-09-27 DIAGNOSIS — I10 Essential (primary) hypertension: Secondary | ICD-10-CM | POA: Diagnosis not present

## 2022-09-27 DIAGNOSIS — C711 Malignant neoplasm of frontal lobe: Secondary | ICD-10-CM | POA: Diagnosis not present

## 2022-09-27 DIAGNOSIS — G40909 Epilepsy, unspecified, not intractable, without status epilepticus: Secondary | ICD-10-CM | POA: Diagnosis not present

## 2022-09-27 DIAGNOSIS — Z8679 Personal history of other diseases of the circulatory system: Secondary | ICD-10-CM | POA: Diagnosis not present

## 2022-09-27 DIAGNOSIS — I2699 Other pulmonary embolism without acute cor pulmonale: Secondary | ICD-10-CM | POA: Diagnosis not present

## 2022-10-17 DEATH — deceased

## 2022-12-30 ENCOUNTER — Ambulatory Visit: Payer: Medicare Other | Admitting: Internal Medicine

## 2022-12-30 ENCOUNTER — Other Ambulatory Visit: Payer: Medicare Other

## 2023-01-29 ENCOUNTER — Other Ambulatory Visit: Payer: Medicare Other

## 2023-01-29 ENCOUNTER — Ambulatory Visit: Payer: Medicare Other | Admitting: Internal Medicine
